# Patient Record
Sex: Female | Born: 1960
Health system: Southern US, Community
[De-identification: ages and names within clinical notes are randomized; demographics above are authoritative.]

## PROBLEM LIST (undated history)

## (undated) ENCOUNTER — Ambulatory Visit

## (undated) DIAGNOSIS — K219 Gastro-esophageal reflux disease without esophagitis: Secondary | ICD-10-CM

## (undated) DIAGNOSIS — I10 Essential (primary) hypertension: Secondary | ICD-10-CM

## (undated) DIAGNOSIS — R200 Anesthesia of skin: Secondary | ICD-10-CM

## (undated) DIAGNOSIS — T7840XA Allergy, unspecified, initial encounter: Secondary | ICD-10-CM

## (undated) DIAGNOSIS — D649 Anemia, unspecified: Secondary | ICD-10-CM

## (undated) DIAGNOSIS — E785 Hyperlipidemia, unspecified: Secondary | ICD-10-CM

## (undated) DIAGNOSIS — M940 Chondrocostal junction syndrome [Tietze]: Secondary | ICD-10-CM

## (undated) DIAGNOSIS — C801 Malignant (primary) neoplasm, unspecified: Secondary | ICD-10-CM

## (undated) HISTORY — PX: BREAST BIOPSY: SHX20

## (undated) HISTORY — DX: Anesthesia of skin: R20.0

## (undated) HISTORY — DX: Anemia, unspecified: D64.9

## (undated) HISTORY — PX: CHOLECYSTECTOMY: SHX55

## (undated) HISTORY — DX: Essential (primary) hypertension: I10

## (undated) HISTORY — DX: Allergy, unspecified, initial encounter: T78.40XA

## (undated) HISTORY — DX: Chondrocostal junction syndrome (tietze): M94.0

## (undated) HISTORY — DX: Gastro-esophageal reflux disease without esophagitis: K21.9

## (undated) HISTORY — DX: Malignant (primary) neoplasm, unspecified: C80.1

## (undated) HISTORY — PX: CYSTECTOMY: SUR359

## (undated) HISTORY — PX: BREAST EXCISIONAL BIOPSY: SUR124

## (undated) HISTORY — PX: ABDOMINAL HYSTERECTOMY: SHX81

## (undated) HISTORY — DX: Hyperlipidemia, unspecified: E78.5

---

## 1997-12-30 ENCOUNTER — Inpatient Hospital Stay (HOSPITAL_COMMUNITY): Admission: RE | Admit: 1997-12-30 | Discharge: 1998-01-01 | Payer: Self-pay | Admitting: Obstetrics and Gynecology

## 1998-01-31 ENCOUNTER — Ambulatory Visit: Admission: RE | Admit: 1998-01-31 | Discharge: 1998-01-31 | Payer: Self-pay | Admitting: Family Medicine

## 2002-04-08 ENCOUNTER — Other Ambulatory Visit: Admission: RE | Admit: 2002-04-08 | Discharge: 2002-04-08 | Payer: Self-pay | Admitting: Family Medicine

## 2006-10-30 ENCOUNTER — Encounter: Admission: RE | Admit: 2006-10-30 | Discharge: 2006-10-30 | Payer: Self-pay | Admitting: Family Medicine

## 2007-09-23 ENCOUNTER — Ambulatory Visit: Payer: Self-pay | Admitting: Cardiology

## 2007-09-23 LAB — CONVERTED CEMR LAB: Pro B Natriuretic peptide (BNP): 72 pg/mL (ref 0.0–100.0)

## 2008-04-27 ENCOUNTER — Encounter: Admission: RE | Admit: 2008-04-27 | Discharge: 2008-04-27 | Payer: Self-pay | Admitting: Family Medicine

## 2009-09-21 ENCOUNTER — Encounter: Admission: RE | Admit: 2009-09-21 | Discharge: 2009-09-21 | Payer: Self-pay | Admitting: Family Medicine

## 2010-04-20 ENCOUNTER — Encounter: Admission: RE | Admit: 2010-04-20 | Discharge: 2010-04-20 | Payer: Self-pay | Admitting: Emergency Medicine

## 2010-04-20 ENCOUNTER — Emergency Department (HOSPITAL_COMMUNITY): Admission: EM | Admit: 2010-04-20 | Discharge: 2010-04-20 | Payer: Self-pay | Admitting: Emergency Medicine

## 2010-05-07 ENCOUNTER — Encounter: Admission: RE | Admit: 2010-05-07 | Discharge: 2010-05-07 | Payer: Self-pay | Admitting: Emergency Medicine

## 2010-05-25 ENCOUNTER — Ambulatory Visit (HOSPITAL_COMMUNITY)
Admission: RE | Admit: 2010-05-25 | Discharge: 2010-05-25 | Payer: Self-pay | Source: Home / Self Care | Admitting: Gastroenterology

## 2010-08-17 ENCOUNTER — Other Ambulatory Visit: Payer: Self-pay | Admitting: Gastroenterology

## 2010-08-17 DIAGNOSIS — K862 Cyst of pancreas: Secondary | ICD-10-CM

## 2010-08-23 ENCOUNTER — Ambulatory Visit
Admission: RE | Admit: 2010-08-23 | Discharge: 2010-08-23 | Disposition: A | Discharge status: Home / Self Care | Payer: 59 | Source: Ambulatory Visit | Attending: Gastroenterology | Admitting: Gastroenterology

## 2010-08-23 DIAGNOSIS — K862 Cyst of pancreas: Secondary | ICD-10-CM

## 2010-08-23 MED ORDER — IOHEXOL 300 MG/ML  SOLN: 125.0000 mL | Freq: Once | INTRAMUSCULAR | Status: AC | PRN

## 2010-09-25 LAB — PANC CYST FLD ANLYS-PATHFNDR-TG

## 2010-09-27 LAB — URINALYSIS, ROUTINE W REFLEX MICROSCOPIC
Bilirubin Urine: NEGATIVE
Glucose, UA: NEGATIVE mg/dL
Hgb urine dipstick: NEGATIVE
Ketones, ur: NEGATIVE mg/dL
Nitrite: NEGATIVE
Protein, ur: NEGATIVE mg/dL
Specific Gravity, Urine: 1.018 (ref 1.005–1.030)
Urobilinogen, UA: 0.2 mg/dL (ref 0.0–1.0)
pH: 7.5 (ref 5.0–8.0)

## 2010-10-26 ENCOUNTER — Other Ambulatory Visit: Payer: Self-pay | Admitting: Family Medicine

## 2010-10-26 DIAGNOSIS — Z1231 Encounter for screening mammogram for malignant neoplasm of breast: Secondary | ICD-10-CM

## 2010-10-31 ENCOUNTER — Ambulatory Visit
Admission: RE | Admit: 2010-10-31 | Discharge: 2010-10-31 | Disposition: A | Discharge status: Home / Self Care | Payer: 59 | Source: Ambulatory Visit | Attending: Family Medicine | Admitting: Family Medicine

## 2010-10-31 DIAGNOSIS — Z1231 Encounter for screening mammogram for malignant neoplasm of breast: Secondary | ICD-10-CM

## 2010-11-09 ENCOUNTER — Encounter: Payer: 59 | Attending: General Surgery | Admitting: *Deleted

## 2010-11-09 DIAGNOSIS — E119 Type 2 diabetes mellitus without complications: Secondary | ICD-10-CM | POA: Insufficient documentation

## 2010-11-09 DIAGNOSIS — Z713 Dietary counseling and surveillance: Secondary | ICD-10-CM | POA: Insufficient documentation

## 2010-11-27 NOTE — Assessment & Plan Note (Signed)
[name]                            CARDIOLOGY OFFICE NOTE   [name] [name]                   MRN:          [id number]  DATE:09/23/2007                            DOB:          02-23-1961    REFERRING PHYSICIAN:  Elvina Parks, M.D.   PRIMARY CARE PHYSICIAN:  Dr. Leilani Parks.   REASON FOR CONSULTATION:  Evaluate patient with questionable paroxysmal  nocturnal dyspnea.   HISTORY OF PRESENT ILLNESS:  The patient is a lovely 50 year old African  American female with no prior cardiac history.  She has been reporting  shortness of breath.  This has been on and off for a year.  She  particularly notices it at night when she gets up to go to the bathroom.  She may come back and be so dyspneic, she has to sleep propped up.  This  does not happen all the time.  She goes to bed lying flat and cannot  sleep through the night without dyspnea.  She does not describe as much  shortness of breath during the day.  She actually did some aerobics for  30 minutes again the other day and started to get back into this.  She  says she was tired but she was breathing okay.  She does not describe  chest pressure, neck or arm discomfort.  She does not have palpitations,  presyncope or syncope.   PAST MEDICAL HISTORY:  1. Hypertension times 14 years.  2. Hyperlipidemia times one year.   PAST SURGICAL HISTORY:  1. C-section x2.  2. Hysterectomy.  3. Skin nodules removed.  4. Skin cancer resected from her eyelid 2004.   ALLERGIES:  None.   MEDICATIONS:  1. Simvastatin 20 mg daily.  2. Diovan 160/25 mg daily.  3. Orlistat.   SOCIAL HISTORY:  The patient is divorced.  She has three adult children,  one grandchild.  She works.  She only smoked briefly quitting 6 years  ago.  She drinks alcohol occasionally.   FAMILY HISTORY:  Noncontributory for coronary disease, though her father  died of heart failure at age 15 probably from EtOH abuse.  Her mom  did  have a slight CVA, but is well at 63.   REVIEW OF SYSTEMS:  Positive for loud snoring, some daytime somnolence,  reflux, progressive slow weight gain, joint pain and bilateral knees,  lower extremity edema.  Negative for other systems.   PHYSICAL EXAMINATION:  GENERAL:  The patient is in no distress.  VITAL SIGNS:  Blood pressure 141/92, heart rate 71 and regular, weight  307 pounds, body mass index 50.  HEENT:  Eyes unremarkable.  Pupils equal, round, reactive to light.  Fundi not well visualized, oral mucous unremarkable.  NECK:  No jugular distention at 45 degrees.  Carotid upstroke brisk and  symmetrical.  No bruits, thyromegaly.  LYMPHATICS:  No cervical, axillary, inguinal adenopathy.  LUNGS:  Clear to auscultation bilaterally.  BACK:  No costovertebral angle tenderness.  CHEST:  Unremarkable.  HEART:  PMI not displaced or sustained, S1-S2 within normal.  No S3-S4,  no clicks, rubs, murmurs.  ABDOMEN:  Morbidly obese, positive bowel sounds normal in frequency and  pitch, no bruits, rebound, guarding or midline pulsatile mass.  No  hepatomegaly or splenomegaly.  SKIN:  No rashes.  No nodules.  EXTREMITIES:  Pulses 2+ throughout, trace bilateral lower extremity  edema.  No cyanosis or clubbing.  NEUROLOGICAL:  Oriented to person, place, time.  Cranial nerves II-XII  grossly intact, motor grossly intact.   STUDIES:  EKG (done in Urgent Care) sinus rhythm, rate 67, axis within  normal limits, intervals within normal limits, no acute ST-T wave  changes.   ASSESSMENT/PLAN:  1. Dyspnea.  The patient has dyspnea and questionable PND at night.      At this point, I am going to start with a BNP level.  I am also      going to bring her back for exercise treadmill test to evaluate her      level of dyspnea.  I have considered an echocardiogram as well.      However, if she has a normal BNP, it is unlikely to find any      significant cardiovascular disease on echo.  Further  evaluation of      this complaint will be based on that these results.  2. Obesity.  This is certainly a significant problem.  I am very proud      of the patient for taking the steps that she has taken to try to      lose weight.  She sounds like she is on the right track.  I am      going to give her a prescription for exercise based on the      treadmill test and will discuss further weight loss.  3. Snoring.  The patient has snoring and almost definitely has sleep      apnea.  This may explain her difficult to control hypertension.      She is going to get a sleep study.  4. Hypertension.  She is having this followed closely by Dr. Pecola Parks and      will continue the medications as listed.  Certainly, weight loss      and any treatment of sleep apnea will improve      this control.  5. Followup.  Will see her at the time of her treadmill.     Diana Rotunda, MD, [facility]  Electronically Signed    [name]  DD: 09/23/2007  DT: 09/24/2007  Job #: [id number]   cc:   [name], M.D.  [name], M.D.

## 2010-12-27 ENCOUNTER — Other Ambulatory Visit (INDEPENDENT_AMBULATORY_CARE_PROVIDER_SITE_OTHER): Payer: Self-pay | Admitting: General Surgery

## 2010-12-27 ENCOUNTER — Encounter (HOSPITAL_COMMUNITY): Payer: 59

## 2010-12-27 ENCOUNTER — Ambulatory Visit (HOSPITAL_COMMUNITY)
Admission: RE | Admit: 2010-12-27 | Discharge: 2010-12-27 | Disposition: A | Discharge status: Home / Self Care | Payer: 59 | Source: Ambulatory Visit | Attending: General Surgery | Admitting: General Surgery

## 2010-12-27 DIAGNOSIS — Z01811 Encounter for preprocedural respiratory examination: Secondary | ICD-10-CM

## 2010-12-27 DIAGNOSIS — Z01810 Encounter for preprocedural cardiovascular examination: Secondary | ICD-10-CM | POA: Insufficient documentation

## 2010-12-27 DIAGNOSIS — Z01818 Encounter for other preprocedural examination: Secondary | ICD-10-CM | POA: Insufficient documentation

## 2010-12-27 DIAGNOSIS — Z01812 Encounter for preprocedural laboratory examination: Secondary | ICD-10-CM | POA: Insufficient documentation

## 2010-12-27 LAB — DIFFERENTIAL
Basophils Absolute: 0 10*3/uL (ref 0.0–0.1)
Basophils Relative: 0 % (ref 0–1)
Eosinophils Absolute: 0.2 10*3/uL (ref 0.0–0.7)
Eosinophils Relative: 2 % (ref 0–5)
Lymphocytes Relative: 41 % (ref 12–46)
Lymphs Abs: 3.3 10*3/uL (ref 0.7–4.0)
Monocytes Absolute: 0.5 10*3/uL (ref 0.1–1.0)
Monocytes Relative: 6 % (ref 3–12)
Neutro Abs: 4.3 10*3/uL (ref 1.7–7.7)
Neutrophils Relative %: 52 % (ref 43–77)

## 2010-12-27 LAB — CBC
HCT: 40.7 % (ref 36.0–46.0)
Hemoglobin: 12.6 g/dL (ref 12.0–15.0)
MCH: 24.5 pg — ABNORMAL LOW (ref 26.0–34.0)
MCHC: 31 g/dL (ref 30.0–36.0)
MCV: 79.2 fL (ref 78.0–100.0)
Platelets: 295 10*3/uL (ref 150–400)
RBC: 5.14 MIL/uL — ABNORMAL HIGH (ref 3.87–5.11)
RDW: 13.7 % (ref 11.5–15.5)
WBC: 8.3 10*3/uL (ref 4.0–10.5)

## 2010-12-27 LAB — PROTIME-INR
INR: 1.02 (ref 0.00–1.49)
Prothrombin Time: 13.6 seconds (ref 11.6–15.2)

## 2010-12-27 LAB — COMPREHENSIVE METABOLIC PANEL
ALT: 31 U/L (ref 0–35)
AST: 26 U/L (ref 0–37)
Albumin: 4.5 g/dL (ref 3.5–5.2)
Alkaline Phosphatase: 63 U/L (ref 39–117)
BUN: 13 mg/dL (ref 6–23)
CO2: 30 mEq/L (ref 19–32)
Calcium: 10 mg/dL (ref 8.4–10.5)
Chloride: 98 mEq/L (ref 96–112)
Creatinine, Ser: 0.7 mg/dL (ref 0.4–1.2)
GFR calc Af Amer: >60 mL/min (ref >60)
GFR calc non Af Amer: >60 mL/min (ref >60)
Glucose, Bld: 118 mg/dL — ABNORMAL HIGH (ref 70–99)
Potassium: 3.6 mEq/L (ref 3.5–5.1)
Sodium: 137 mEq/L (ref 135–145)
Total Bilirubin: 0.3 mg/dL (ref 0.3–1.2)
Total Protein: 7.9 g/dL (ref 6.0–8.3)

## 2010-12-27 LAB — CEA: CEA: 0.6 ng/mL (ref 0.0–5.0)

## 2010-12-27 LAB — SURGICAL PCR SCREEN
MRSA, PCR: NEGATIVE
Staphylococcus aureus: NEGATIVE

## 2010-12-27 LAB — CANCER ANTIGEN 19-9: CA 19-9: 10.5 U/mL — ABNORMAL LOW (ref <35.0)

## 2010-12-27 LAB — APTT: aPTT: 36 seconds (ref 24–37)

## 2011-01-09 ENCOUNTER — Inpatient Hospital Stay (HOSPITAL_COMMUNITY)
Admission: RE | Admit: 2011-01-09 | Discharge: 2011-01-14 | DRG: 406 | Disposition: A | Discharge status: Home / Self Care | Payer: 59 | Source: Ambulatory Visit | Attending: General Surgery | Admitting: General Surgery

## 2011-01-09 ENCOUNTER — Other Ambulatory Visit (INDEPENDENT_AMBULATORY_CARE_PROVIDER_SITE_OTHER): Payer: Self-pay | Admitting: General Surgery

## 2011-01-09 DIAGNOSIS — K863 Pseudocyst of pancreas: Secondary | ICD-10-CM

## 2011-01-09 DIAGNOSIS — E119 Type 2 diabetes mellitus without complications: Secondary | ICD-10-CM | POA: Diagnosis present

## 2011-01-09 DIAGNOSIS — J9819 Other pulmonary collapse: Secondary | ICD-10-CM | POA: Diagnosis not present

## 2011-01-09 DIAGNOSIS — K8010 Calculus of gallbladder with chronic cholecystitis without obstruction: Secondary | ICD-10-CM

## 2011-01-09 DIAGNOSIS — Z01812 Encounter for preprocedural laboratory examination: Secondary | ICD-10-CM

## 2011-01-09 DIAGNOSIS — I10 Essential (primary) hypertension: Secondary | ICD-10-CM | POA: Diagnosis present

## 2011-01-09 DIAGNOSIS — D136 Benign neoplasm of pancreas: Principal | ICD-10-CM | POA: Diagnosis present

## 2011-01-09 DIAGNOSIS — K811 Chronic cholecystitis: Secondary | ICD-10-CM | POA: Diagnosis present

## 2011-01-09 DIAGNOSIS — K862 Cyst of pancreas: Secondary | ICD-10-CM

## 2011-01-09 HISTORY — PX: SPLENECTOMY, PARTIAL: SHX787

## 2011-01-09 HISTORY — PX: PANCREATECTOMY: SHX1019

## 2011-01-09 LAB — GLUCOSE, CAPILLARY
Glucose-Capillary: 151 mg/dL — ABNORMAL HIGH (ref 70–99)
Glucose-Capillary: 217 mg/dL — ABNORMAL HIGH (ref 70–99)
Glucose-Capillary: 218 mg/dL — ABNORMAL HIGH (ref 70–99)
Glucose-Capillary: 232 mg/dL — ABNORMAL HIGH (ref 70–99)

## 2011-01-09 LAB — TYPE AND SCREEN
ABO/RH(D): O POS
Antibody Screen: NEGATIVE

## 2011-01-09 LAB — ABO/RH: ABO/RH(D): O POS

## 2011-01-10 DIAGNOSIS — I10 Essential (primary) hypertension: Secondary | ICD-10-CM

## 2011-01-10 DIAGNOSIS — E119 Type 2 diabetes mellitus without complications: Secondary | ICD-10-CM

## 2011-01-10 LAB — GLUCOSE, CAPILLARY
Glucose-Capillary: 160 mg/dL — ABNORMAL HIGH (ref 70–99)
Glucose-Capillary: 172 mg/dL — ABNORMAL HIGH (ref 70–99)
Glucose-Capillary: 195 mg/dL — ABNORMAL HIGH (ref 70–99)
Glucose-Capillary: 211 mg/dL — ABNORMAL HIGH (ref 70–99)
Glucose-Capillary: 227 mg/dL — ABNORMAL HIGH (ref 70–99)

## 2011-01-10 LAB — HEMOGLOBIN A1C
Hgb A1c MFr Bld: 7.7 % — ABNORMAL HIGH (ref <5.7)
Mean Plasma Glucose: 174 mg/dL — ABNORMAL HIGH (ref <117)

## 2011-01-10 LAB — CBC
HCT: 35.9 % — ABNORMAL LOW (ref 36.0–46.0)
Hemoglobin: 11.3 g/dL — ABNORMAL LOW (ref 12.0–15.0)
MCH: 24.8 pg — ABNORMAL LOW (ref 26.0–34.0)
MCHC: 31.5 g/dL (ref 30.0–36.0)
MCV: 78.9 fL (ref 78.0–100.0)
Platelets: 274 10*3/uL (ref 150–400)
RBC: 4.55 MIL/uL (ref 3.87–5.11)
RDW: 14.1 % (ref 11.5–15.5)
WBC: 19 10*3/uL — ABNORMAL HIGH (ref 4.0–10.5)

## 2011-01-10 LAB — BASIC METABOLIC PANEL
BUN: 8 mg/dL (ref 6–23)
CO2: 27 mEq/L (ref 19–32)
Calcium: 8.7 mg/dL (ref 8.4–10.5)
Chloride: 95 mEq/L — ABNORMAL LOW (ref 96–112)
Creatinine, Ser: 0.61 mg/dL (ref 0.50–1.10)
GFR calc Af Amer: >60 mL/min (ref >60)
GFR calc non Af Amer: >60 mL/min (ref >60)
Glucose, Bld: 213 mg/dL — ABNORMAL HIGH (ref 70–99)
Potassium: 4.2 mEq/L (ref 3.5–5.1)
Sodium: 130 mEq/L — ABNORMAL LOW (ref 135–145)

## 2011-01-10 LAB — MAGNESIUM: Magnesium: 2 mg/dL (ref 1.5–2.5)

## 2011-01-10 LAB — PHOSPHORUS: Phosphorus: 3.1 mg/dL (ref 2.3–4.6)

## 2011-01-11 LAB — BASIC METABOLIC PANEL
BUN: 7 mg/dL (ref 6–23)
CO2: 30 mEq/L (ref 19–32)
Calcium: 9.1 mg/dL (ref 8.4–10.5)
Chloride: 96 mEq/L (ref 96–112)
Creatinine, Ser: 0.69 mg/dL (ref 0.50–1.10)
GFR calc Af Amer: >60 mL/min (ref >60)
GFR calc non Af Amer: >60 mL/min (ref >60)
Glucose, Bld: 208 mg/dL — ABNORMAL HIGH (ref 70–99)
Potassium: 3.7 mEq/L (ref 3.5–5.1)
Sodium: 131 mEq/L — ABNORMAL LOW (ref 135–145)

## 2011-01-11 LAB — MAGNESIUM: Magnesium: 2.2 mg/dL (ref 1.5–2.5)

## 2011-01-11 LAB — GLUCOSE, CAPILLARY
Glucose-Capillary: 181 mg/dL — ABNORMAL HIGH (ref 70–99)
Glucose-Capillary: 184 mg/dL — ABNORMAL HIGH (ref 70–99)
Glucose-Capillary: 221 mg/dL — ABNORMAL HIGH (ref 70–99)
Glucose-Capillary: 216 mg/dL — ABNORMAL HIGH (ref 70–99)

## 2011-01-11 LAB — CBC
HCT: 33.5 % — ABNORMAL LOW (ref 36.0–46.0)
Hemoglobin: 10.7 g/dL — ABNORMAL LOW (ref 12.0–15.0)
MCH: 25.2 pg — ABNORMAL LOW (ref 26.0–34.0)
MCHC: 31.9 g/dL (ref 30.0–36.0)
MCV: 78.8 fL (ref 78.0–100.0)
Platelets: 259 10*3/uL (ref 150–400)
RBC: 4.25 MIL/uL (ref 3.87–5.11)
RDW: 14.2 % (ref 11.5–15.5)
WBC: 20.7 10*3/uL — ABNORMAL HIGH (ref 4.0–10.5)

## 2011-01-11 LAB — PHOSPHORUS: Phosphorus: 1.8 mg/dL — ABNORMAL LOW (ref 2.3–4.6)

## 2011-01-12 LAB — CBC
HCT: 34.8 % — ABNORMAL LOW (ref 36.0–46.0)
Hemoglobin: 10.9 g/dL — ABNORMAL LOW (ref 12.0–15.0)
MCH: 24.5 pg — ABNORMAL LOW (ref 26.0–34.0)
MCHC: 31.3 g/dL (ref 30.0–36.0)
MCV: 78.4 fL (ref 78.0–100.0)
Platelets: 288 10*3/uL (ref 150–400)
RBC: 4.44 MIL/uL (ref 3.87–5.11)
RDW: 14.2 % (ref 11.5–15.5)
WBC: 20.4 10*3/uL — ABNORMAL HIGH (ref 4.0–10.5)

## 2011-01-12 LAB — BASIC METABOLIC PANEL
BUN: 5 mg/dL — ABNORMAL LOW (ref 6–23)
CO2: 31 mEq/L (ref 19–32)
Calcium: 8.7 mg/dL (ref 8.4–10.5)
Chloride: 100 mEq/L (ref 96–112)
Creatinine, Ser: 0.65 mg/dL (ref 0.50–1.10)
GFR calc Af Amer: >60 mL/min (ref >60)
GFR calc non Af Amer: >60 mL/min (ref >60)
Glucose, Bld: 175 mg/dL — ABNORMAL HIGH (ref 70–99)
Potassium: 3.7 mEq/L (ref 3.5–5.1)
Sodium: 138 mEq/L (ref 135–145)

## 2011-01-12 LAB — GLUCOSE, CAPILLARY
Glucose-Capillary: 197 mg/dL — ABNORMAL HIGH (ref 70–99)
Glucose-Capillary: 204 mg/dL — ABNORMAL HIGH (ref 70–99)
Glucose-Capillary: 191 mg/dL — ABNORMAL HIGH (ref 70–99)
Glucose-Capillary: 182 mg/dL — ABNORMAL HIGH (ref 70–99)
Glucose-Capillary: 182 mg/dL — ABNORMAL HIGH (ref 70–99)

## 2011-01-12 LAB — MAGNESIUM: Magnesium: 2.6 mg/dL — ABNORMAL HIGH (ref 1.5–2.5)

## 2011-01-12 LAB — PHOSPHORUS: Phosphorus: 1.9 mg/dL — ABNORMAL LOW (ref 2.3–4.6)

## 2011-01-13 LAB — CBC
HCT: 32.5 % — ABNORMAL LOW (ref 36.0–46.0)
Hemoglobin: 10.6 g/dL — ABNORMAL LOW (ref 12.0–15.0)
MCH: 25.1 pg — ABNORMAL LOW (ref 26.0–34.0)
MCHC: 32.6 g/dL (ref 30.0–36.0)
MCV: 77 fL — ABNORMAL LOW (ref 78.0–100.0)
Platelets: 332 10*3/uL (ref 150–400)
RBC: 4.22 MIL/uL (ref 3.87–5.11)
RDW: 14.4 % (ref 11.5–15.5)
WBC: 18.1 10*3/uL — ABNORMAL HIGH (ref 4.0–10.5)

## 2011-01-13 LAB — GLUCOSE, CAPILLARY
Glucose-Capillary: 200 mg/dL — ABNORMAL HIGH (ref 70–99)
Glucose-Capillary: 170 mg/dL — ABNORMAL HIGH (ref 70–99)

## 2011-01-13 NOTE — Op Note (Signed)
NAMEMICHAEL, Parks                 ACCOUNT NO.:  [id number]  MEDICAL RECORD NO.:  [id number]  LOCATION:  0007                         FACILITY:  [facility]  PHYSICIAN:  Almond Lint, MD       DATE OF BIRTH:  28-Jan-1961  DATE OF PROCEDURE:  01/09/2011 DATE OF DISCHARGE:                              OPERATIVE REPORT   PREOPERATIVE DIAGNOSIS:  Persistent pseudocyst with left upper quadrant pain as well as right upper quadrant pain with fatty foods and gallstones.  POSTOPERATIVE DIAGNOSIS:  Persistent pseudocyst with left upper quadrant pain as well as right upper quadrant pain with fatty foods and gallstones.  PROCEDURE:  Laparoscopic distal pancreatectomy and splenectomy and laparoscopic cholecystectomy.  SURGEON:  Almond Lint, MD  ASSISTANT:  Chevis Pretty, MD  ANESTHESIA:  General and local.  FINDINGS:  Pseudocyst intimately associated with splenic hilum.  SPECIMEN:  Distal pancreas and spleen and gallbladder to pathology.  ESTIMATED BLOOD LOSS:  Around 50 to 100 mL.  COMPLICATIONS:  None known.  PROCEDURE:  Ms. [name] was identified in the holding area and taken to operating room where she was placed supine on the operating room table. General anesthesia was induced.  She was then placed into the right lateral decubitus position on a beanbag with her midline marked.  The abdomen was prepped and draped in sterile fashion.  Time-out was performed according to surgical safety check list.  When all was correct, we continued.  The abdomen was accessed with a 5-mm Optiview trocar.  This had to be switched from 75 to 100 to be able to access the abdomen.  Once the layers of the abdominal wall were visualized, pneumoperitoneum was to a pressure of 15 mmHg. The patient was placed in reverse Trendelenburg position.  Three additional 5-mm trocars were placed in the left subcostal region.  There were some omental adhesions of the abdominal wall that were taken down. The lesser sac was  opened by dividing the short gastrics with harmonic scalpel.  The attachments of the spleen to the diaphragm were also taken.  The plane was then taken off laterally.  The peritoneum was opened at the inferior border of the pancreas and this was taken laterally with the harmonic as well.  At this point, a hand port was placed in the left subcostal region.  This was done by using a #10 blade to incise the skin and then using cautery to get through the subcutaneous tissues and the muscle.  The harmonic scalpel was used to divide the superior epigastric artery and vein.  The GelPort was placed in.  The hand retraction was then used to facilitate dissecting off the splenic vessels from the pancreas.  The splenic vessels were then divided with the Echelon vascular load battery-operated stapler.  The pancreas was then divided with gold blue Echelon stapler.  Additional vascular load was used to divide some of the retroperitoneal attachments and then the harmonic was used to take the specimen off of the retroperitoneum rest of the way.  This was then pulled out through the GelPort.  There was no significant bleeding seen and hemostasis was achieved with the cautery.  A drain was placed  through the most lateral trocar site and sewn in place with 2-0 nylon.  Tisseel was placed over the pancreatic tail.  There was no evidence of leak or bleeding at the pancreatic tail.  One of the trocars had been upsized to a 12 to accommodate the stapler.  This was closed with Endoclose and 0 Vicryl. This was airtight and no residual palpable fascial defect was present. The GelPort site and the additional 5-mm trocar site were stapled closed and the patient was placed back supine.  The abdomen was re-prepped and draped and another timeout was performed.  The GelPort was replaced and the 5 mm trocar was replaced at the additional site.  Two additional 5 mm trocars were placed in the right upper quadrant.  The  patient was placed into reverse Trendelenburg position and rotated to the left.  The gallbladder was extremely distended.  This was elevated toward the head and then the infundibulum retracted laterally.  The hook cautery was used to open the peritoneum and then the cystic artery was very superficial.  This was skeletonized, clipped, and divided.  The cystic duct was quite tapered and this was also skeletonized away from the gallbladder.  The hook cautery was used to open up the peritoneum to allow a critical view, demonstrating the cystic duct going directly into the gallbladder.  This was quadruply clipped on the patient's side and singly clipped on the specimen side.  This was then divided.  The cautery was used to take gallbladder off the gallbladder fossa.  This was then placed into an EndoCatch bag and held in place at [employer]. There was no residual bleeding or biliary leakage from the liver bed. The gallbladder was then removed.  The full quadrant inspection was performed and there was no evidence of blood pooling in the abdomen or other significant gross pathology.  There were a few small adhesions in the abdominal wall that were taken down.  The 5-mm trocars were then removed and the GelPort was removed.  The GelPort incision site was irrigated copiously.  This was then closed and 2 fascial layers with 0 PDS suture in a running fashion.  In between the layers, the On-Q pain pump catheter was placed.  The skin was then irrigated again and closed with skin staples.  The wounds were dressed with dry sterile dressings and the patient was awaken from anesthesia and taken to PACU in stable condition.  Needle, sponge, and instrument counts were correct x2.     Almond Lint, MD     FB/MEDQ  D:  01/09/2011  T:  01/09/2011  Job:  [id number]  Electronically Signed by Almond Lint MD on 01/13/2011 12:50:48 PM

## 2011-01-14 LAB — DIFFERENTIAL
Basophils Absolute: 0 10*3/uL (ref 0.0–0.1)
Basophils Relative: 0 % (ref 0–1)
Eosinophils Absolute: 0.2 10*3/uL (ref 0.0–0.7)
Eosinophils Relative: 1 % (ref 0–5)
Lymphocytes Relative: 17 % (ref 12–46)
Lymphs Abs: 3.6 10*3/uL (ref 0.7–4.0)
Monocytes Absolute: 1.4 10*3/uL — ABNORMAL HIGH (ref 0.1–1.0)
Monocytes Relative: 7 % (ref 3–12)
Neutro Abs: 15.5 10*3/uL — ABNORMAL HIGH (ref 1.7–7.7)
Neutrophils Relative %: 75 % (ref 43–77)

## 2011-01-14 LAB — CBC
HCT: 31.6 % — ABNORMAL LOW (ref 36.0–46.0)
Hemoglobin: 10.3 g/dL — ABNORMAL LOW (ref 12.0–15.0)
MCH: 24.7 pg — ABNORMAL LOW (ref 26.0–34.0)
MCHC: 32.6 g/dL (ref 30.0–36.0)
MCV: 75.8 fL — ABNORMAL LOW (ref 78.0–100.0)
Platelets: 364 10*3/uL (ref 150–400)
RBC: 4.17 MIL/uL (ref 3.87–5.11)
RDW: 14 % (ref 11.5–15.5)
WBC: 20.8 10*3/uL — ABNORMAL HIGH (ref 4.0–10.5)

## 2011-01-14 LAB — GLUCOSE, CAPILLARY
Glucose-Capillary: 173 mg/dL — ABNORMAL HIGH (ref 70–99)
Glucose-Capillary: 149 mg/dL — ABNORMAL HIGH (ref 70–99)

## 2011-01-18 ENCOUNTER — Encounter (INDEPENDENT_AMBULATORY_CARE_PROVIDER_SITE_OTHER): Payer: 59 | Admitting: Surgery

## 2011-01-18 ENCOUNTER — Encounter (INDEPENDENT_AMBULATORY_CARE_PROVIDER_SITE_OTHER): Payer: Self-pay | Admitting: Surgery

## 2011-01-18 ENCOUNTER — Other Ambulatory Visit (INDEPENDENT_AMBULATORY_CARE_PROVIDER_SITE_OTHER): Payer: Self-pay | Admitting: General Surgery

## 2011-01-18 DIAGNOSIS — D136 Benign neoplasm of pancreas: Secondary | ICD-10-CM

## 2011-01-18 LAB — CULTURE, BLOOD (ROUTINE X 2)
Culture  Setup Time: 201206300425
Culture  Setup Time: 201206300425
Culture: NO GROWTH
Culture: NO GROWTH

## 2011-01-21 ENCOUNTER — Ambulatory Visit: Payer: 59 | Admitting: *Deleted

## 2011-01-22 NOTE — Discharge Summary (Signed)
  [name], [name]                 ACCOUNT NO.:  [id number]  MEDICAL RECORD NO.:  [id number]  LOCATION:  1535                         FACILITY:  [facility]  PHYSICIAN:  Almond Lint, MD       DATE OF BIRTH:  Nov 16, 1960  DATE OF ADMISSION:  01/09/2011 DATE OF DISCHARGE:  01/14/2011                              DISCHARGE SUMMARY   PRINCIPAL DIAGNOSIS:  Serous cystadenoma of the pancreas and chronic cholecystitis.  PRINCIPAL PROCEDURE:  Laparoscopic cholecystectomy and laparoscopic distal pancreatectomy and splenectomy.  DISCHARGE MEDICATIONS: 1. Percocet 1 to 2 tablets p.o. q.4 h. p.r.n. pain. 2. Lisinopril/hydrochlorothiazide 20/12.5 once a day. 3. Metformin 500 mg q.h.s. 4. Pravastatin 40 mg q.a.m. 5. Ibuprofen 600 mg q.8 h. as needed. 6. Vitamin D 50,000 units once a week.  DISCHARGE INSTRUCTIONS:  Emptying her cord, JP drain twice a day.  Call for fevers over 101.5, increased pain or increased nausea or bilious drainage from the Jackson-Pratt drain.  No lifting over 20 pounds for 6 weeks.  No driving for 2 weeks.  Follow up with me in clinic on January 18, 2011, at 11 a.m.  DISCHARGE DIET:  Carbohydrate-controlled, may shower.  HOSPITAL COURSE:  [name] is admitted to the step-down unit following her above procedure.  She did well on postop day #1 and was transferred to the floor.  Her NG tube was removed and her Foley catheter was removed on postop day #2 without incident.  She required some repletion of hypophosphatemia.  Her blood pressure improved.  Over the weekend, she did have some temperatures to 101.4, but did seemed to not be taking in deep breath.  Her abdomen was doing well and she did not have any nausea.  Her labs were good other than her white count of 20.  She was kept on Lovenox for help.  Her JP output was serosanguineous, but she did have 1 day of high output.  On June 30th, this was 70 mL.  She is going to be discharged to home in improved condition.  She  feels well and tolerating a regular carbohydrate-controlled diet.  She is personally instructed by myself per the call parameters.  She is at risk for having a leak from the pancreas, however, with how good she is feeling in her low-grade temperatures, I would not get a CAT scan just based on the white count.  I am going to follow her up in 4 days and remove her staples at this time.     Almond Lint, MD     FB/MEDQ  D:  01/14/2011  T:  01/14/2011  Job:  [id number]  Electronically Signed by Almond Lint MD on 01/22/2011 01:52:00 PM

## 2011-01-23 ENCOUNTER — Encounter (INDEPENDENT_AMBULATORY_CARE_PROVIDER_SITE_OTHER): Payer: Self-pay | Admitting: General Surgery

## 2011-01-24 ENCOUNTER — Ambulatory Visit (INDEPENDENT_AMBULATORY_CARE_PROVIDER_SITE_OTHER): Payer: 59 | Admitting: General Surgery

## 2011-01-24 ENCOUNTER — Encounter (INDEPENDENT_AMBULATORY_CARE_PROVIDER_SITE_OTHER): Payer: Self-pay | Admitting: General Surgery

## 2011-01-24 DIAGNOSIS — K811 Chronic cholecystitis: Secondary | ICD-10-CM

## 2011-01-24 DIAGNOSIS — D279 Benign neoplasm of unspecified ovary: Secondary | ICD-10-CM | POA: Insufficient documentation

## 2011-01-24 DIAGNOSIS — Z9049 Acquired absence of other specified parts of digestive tract: Secondary | ICD-10-CM | POA: Insufficient documentation

## 2011-01-24 MED ORDER — CIPROFLOXACIN HCL 750 MG PO TABS: 750.0000 mg | ORAL_TABLET | Freq: Two times a day (BID) | ORAL | Status: AC

## 2011-01-24 MED ORDER — OXYCODONE-ACETAMINOPHEN 5-325 MG PO TABS: 1.0000 | ORAL_TABLET | ORAL | Status: DC | PRN

## 2011-01-24 NOTE — Progress Notes (Signed)
Subjective:     Patient ID: [name], female   DOB: 1960-12-01, 51 y.o.   MRN: [id number]    Temp(Src) 96.9 F (36.1 C) (Temporal)    HPI [name] is doing okay status posterior laparoscopic cholecystectomy and lap distal pancreatectomy and splenectomy. She is having some low-grade fevers. She does have murky drain output. Her appetite is continuing to improve along with her energy level. She is having no drainage from her wound. She does complain of some numbness of her left fifth finger and her left foot.  Review of Systems     Objective:   Physical Exam Alert and oriented. Well groomed Abd soft, non distended, non tender   Drain output murky. Assessment:     Status post laparoscopic cholecystectomy and laparoscopic distal pancreatectomy and splenectomy. Doing reasonably well despite having murky drain output.    Plan:     She has a CT scan scheduled tomorrow to make sure she doesn't have any additional fluid collection needs to be drained. I will also place her on ciprofloxacin. I will follow her  up in 2 weeks.

## 2011-01-24 NOTE — Assessment & Plan Note (Addendum)
Murky drain output. Getting CT scan tomorrow Follow up in 2 weeks?

## 2011-01-25 ENCOUNTER — Ambulatory Visit
Admission: RE | Admit: 2011-01-25 | Discharge: 2011-01-25 | Disposition: A | Discharge status: Home / Self Care | Payer: 59 | Source: Ambulatory Visit | Attending: General Surgery | Admitting: General Surgery

## 2011-01-25 DIAGNOSIS — D136 Benign neoplasm of pancreas: Secondary | ICD-10-CM

## 2011-01-25 MED ORDER — IOHEXOL 300 MG/ML  SOLN
125.0000 mL | Freq: Once | INTRAMUSCULAR | Status: AC | PRN
  Administered 2011-01-25: 125 mL via INTRAVENOUS

## 2011-02-01 ENCOUNTER — Encounter (HOSPITAL_COMMUNITY): Payer: Self-pay | Admitting: Radiology

## 2011-02-01 ENCOUNTER — Inpatient Hospital Stay (HOSPITAL_COMMUNITY)
Admission: EM | Admit: 2011-02-01 | Discharge: 2011-02-13 | DRG: 862 | Disposition: A | Discharge status: Home / Self Care | Payer: 59 | Attending: General Surgery | Admitting: General Surgery

## 2011-02-01 ENCOUNTER — Emergency Department (HOSPITAL_COMMUNITY): Payer: 59

## 2011-02-01 DIAGNOSIS — Z9089 Acquired absence of other organs: Secondary | ICD-10-CM

## 2011-02-01 DIAGNOSIS — E669 Obesity, unspecified: Secondary | ICD-10-CM | POA: Diagnosis present

## 2011-02-01 DIAGNOSIS — E785 Hyperlipidemia, unspecified: Secondary | ICD-10-CM | POA: Diagnosis present

## 2011-02-01 DIAGNOSIS — Y836 Removal of other organ (partial) (total) as the cause of abnormal reaction of the patient, or of later complication, without mention of misadventure at the time of the procedure: Secondary | ICD-10-CM | POA: Diagnosis present

## 2011-02-01 DIAGNOSIS — K651 Peritoneal abscess: Secondary | ICD-10-CM | POA: Diagnosis present

## 2011-02-01 DIAGNOSIS — L02219 Cutaneous abscess of trunk, unspecified: Secondary | ICD-10-CM | POA: Diagnosis present

## 2011-02-01 DIAGNOSIS — E119 Type 2 diabetes mellitus without complications: Secondary | ICD-10-CM | POA: Diagnosis present

## 2011-02-01 DIAGNOSIS — D136 Benign neoplasm of pancreas: Secondary | ICD-10-CM | POA: Diagnosis present

## 2011-02-01 DIAGNOSIS — K811 Chronic cholecystitis: Secondary | ICD-10-CM | POA: Diagnosis present

## 2011-02-01 DIAGNOSIS — T8140XA Infection following a procedure, unspecified, initial encounter: Principal | ICD-10-CM | POA: Diagnosis present

## 2011-02-01 DIAGNOSIS — Z79899 Other long term (current) drug therapy: Secondary | ICD-10-CM

## 2011-02-01 LAB — URINALYSIS, ROUTINE W REFLEX MICROSCOPIC
Bilirubin Urine: NEGATIVE
Glucose, UA: NEGATIVE mg/dL
Hgb urine dipstick: NEGATIVE
Ketones, ur: NEGATIVE mg/dL
Leukocytes, UA: NEGATIVE
Nitrite: NEGATIVE
Protein, ur: NEGATIVE mg/dL
Specific Gravity, Urine: 1.018 (ref 1.005–1.030)
Urobilinogen, UA: 0.2 mg/dL (ref 0.0–1.0)
pH: 5.5 (ref 5.0–8.0)

## 2011-02-01 LAB — COMPREHENSIVE METABOLIC PANEL
ALT: 26 U/L (ref 0–35)
AST: 18 U/L (ref 0–37)
Albumin: 3.9 g/dL (ref 3.5–5.2)
Alkaline Phosphatase: 80 U/L (ref 39–117)
BUN: 10 mg/dL (ref 6–23)
CO2: 29 mEq/L (ref 19–32)
Calcium: 10 mg/dL (ref 8.4–10.5)
Chloride: 96 mEq/L (ref 96–112)
Creatinine, Ser: 0.63 mg/dL (ref 0.50–1.10)
GFR calc Af Amer: >60 mL/min (ref >60)
GFR calc non Af Amer: >60 mL/min (ref >60)
Glucose, Bld: 179 mg/dL — ABNORMAL HIGH (ref 70–99)
Potassium: 4.5 mEq/L (ref 3.5–5.1)
Sodium: 137 mEq/L (ref 135–145)
Total Bilirubin: 0.4 mg/dL (ref 0.3–1.2)
Total Protein: 8.8 g/dL — ABNORMAL HIGH (ref 6.0–8.3)

## 2011-02-01 LAB — CBC
HCT: 34.8 % — ABNORMAL LOW (ref 36.0–46.0)
Hemoglobin: 10.9 g/dL — ABNORMAL LOW (ref 12.0–15.0)
MCH: 24.1 pg — ABNORMAL LOW (ref 26.0–34.0)
MCHC: 31.3 g/dL (ref 30.0–36.0)
MCV: 77 fL — ABNORMAL LOW (ref 78.0–100.0)
Platelets: 648 10*3/uL — ABNORMAL HIGH (ref 150–400)
RBC: 4.52 MIL/uL (ref 3.87–5.11)
RDW: 14.2 % (ref 11.5–15.5)
WBC: 13.5 10*3/uL — ABNORMAL HIGH (ref 4.0–10.5)

## 2011-02-01 LAB — DIFFERENTIAL
Basophils Absolute: 0 10*3/uL (ref 0.0–0.1)
Basophils Relative: 0 % (ref 0–1)
Eosinophils Absolute: 0.4 10*3/uL (ref 0.0–0.7)
Eosinophils Relative: 3 % (ref 0–5)
Lymphocytes Relative: 30 % (ref 12–46)
Lymphs Abs: 4.1 10*3/uL — ABNORMAL HIGH (ref 0.7–4.0)
Monocytes Absolute: 1.3 10*3/uL — ABNORMAL HIGH (ref 0.1–1.0)
Monocytes Relative: 10 % (ref 3–12)
Neutro Abs: 7.7 10*3/uL (ref 1.7–7.7)
Neutrophils Relative %: 57 % (ref 43–77)

## 2011-02-01 LAB — LIPASE, BLOOD: Lipase: 28 U/L (ref 11–59)

## 2011-02-01 MED ORDER — IOHEXOL 300 MG/ML  SOLN
100.0000 mL | Freq: Once | INTRAMUSCULAR | Status: AC | PRN
  Administered 2011-02-01: 100 mL via INTRAVENOUS

## 2011-02-02 ENCOUNTER — Observation Stay (HOSPITAL_COMMUNITY): Payer: 59

## 2011-02-02 LAB — GLUCOSE, CAPILLARY
Glucose-Capillary: 151 mg/dL — ABNORMAL HIGH (ref 70–99)
Glucose-Capillary: 221 mg/dL — ABNORMAL HIGH (ref 70–99)
Glucose-Capillary: 193 mg/dL — ABNORMAL HIGH (ref 70–99)

## 2011-02-02 LAB — MRSA PCR SCREENING: MRSA by PCR: NEGATIVE

## 2011-02-02 LAB — AMYLASE, BODY FLUID: Amylase, Fluid: 1066 U/L

## 2011-02-03 LAB — GLUCOSE, CAPILLARY
Glucose-Capillary: 183 mg/dL — ABNORMAL HIGH (ref 70–99)
Glucose-Capillary: 181 mg/dL — ABNORMAL HIGH (ref 70–99)
Glucose-Capillary: 174 mg/dL — ABNORMAL HIGH (ref 70–99)

## 2011-02-03 LAB — COMPREHENSIVE METABOLIC PANEL
ALT: 26 U/L (ref 0–35)
AST: 23 U/L (ref 0–37)
Albumin: 3.1 g/dL — ABNORMAL LOW (ref 3.5–5.2)
Alkaline Phosphatase: 72 U/L (ref 39–117)
BUN: 5 mg/dL — ABNORMAL LOW (ref 6–23)
CO2: 28 mEq/L (ref 19–32)
Calcium: 9.5 mg/dL (ref 8.4–10.5)
Chloride: 96 mEq/L (ref 96–112)
Creatinine, Ser: 0.59 mg/dL (ref 0.50–1.10)
GFR calc Af Amer: >60 mL/min (ref >60)
GFR calc non Af Amer: >60 mL/min (ref >60)
Glucose, Bld: 195 mg/dL — ABNORMAL HIGH (ref 70–99)
Potassium: 3.7 mEq/L (ref 3.5–5.1)
Sodium: 134 mEq/L — ABNORMAL LOW (ref 135–145)
Total Bilirubin: 0.3 mg/dL (ref 0.3–1.2)
Total Protein: 7.8 g/dL (ref 6.0–8.3)

## 2011-02-03 LAB — DIFFERENTIAL
Basophils Absolute: 0 10*3/uL (ref 0.0–0.1)
Basophils Relative: 0 % (ref 0–1)
Eosinophils Absolute: 0.1 10*3/uL (ref 0.0–0.7)
Eosinophils Relative: 1 % (ref 0–5)
Lymphocytes Relative: 18 % (ref 12–46)
Lymphs Abs: 2.4 10*3/uL (ref 0.7–4.0)
Monocytes Absolute: 1.4 10*3/uL — ABNORMAL HIGH (ref 0.1–1.0)
Monocytes Relative: 11 % (ref 3–12)
Neutro Abs: 9.2 10*3/uL — ABNORMAL HIGH (ref 1.7–7.7)
Neutrophils Relative %: 71 % (ref 43–77)

## 2011-02-03 LAB — CBC
HCT: 31.6 % — ABNORMAL LOW (ref 36.0–46.0)
Hemoglobin: 9.9 g/dL — ABNORMAL LOW (ref 12.0–15.0)
MCH: 24.1 pg — ABNORMAL LOW (ref 26.0–34.0)
MCHC: 31.3 g/dL (ref 30.0–36.0)
MCV: 76.9 fL — ABNORMAL LOW (ref 78.0–100.0)
Platelets: 581 10*3/uL — ABNORMAL HIGH (ref 150–400)
RBC: 4.11 MIL/uL (ref 3.87–5.11)
RDW: 14.2 % (ref 11.5–15.5)
WBC: 13.1 10*3/uL — ABNORMAL HIGH (ref 4.0–10.5)

## 2011-02-03 LAB — URINE CULTURE
Colony Count: 15000
Culture  Setup Time: 201207210410

## 2011-02-03 LAB — HEMOGLOBIN A1C
Hgb A1c MFr Bld: 8 % — ABNORMAL HIGH (ref <5.7)
Mean Plasma Glucose: 183 mg/dL — ABNORMAL HIGH (ref <117)

## 2011-02-03 NOTE — H&P (Signed)
[name], [name]                 ACCOUNT NO.:  [id number]  MEDICAL RECORD NO.:  [id number]  LOCATION:  1538                         FACILITY:  [facility]  PHYSICIAN:  Thornton Park. Daphine Deutscher, MD  DATE OF BIRTH:  12-Apr-1961  DATE OF ADMISSION:  02/01/2011 DATE OF DISCHARGE:                             HISTORY & PHYSICAL   ADMISSION DIAGNOSES: 1. Fever. 2. Abdominal pain status post pancreatectomy.  HISTORY:  This is a 50 year old African-American female who was discharged from [facility] on January 14, 2011, having had a laparoscopic assisted distal pancreatectomy, splenectomy, along with a lap chole cystectomy on the January 09, 2011.  Pathology proved this lesion in the pancreas to be a serous cystadenoma of the pancreas and she had chronic cholecystitis.  She had a Jackson-Pratt drain placed in the distal pancreatic bed.  Within the last several days, she has began having some low-grade fevers that have gotten higher along with some pain in her abdomen and some difficulty with deep breath.  She worked on her incentive spirometry at home but when she noticed the characteristic of her drainage going from fairly clear to more cloudy, she became concerned, call the office and was referred to the [facility] ER for evaluation where she was seen by Dr. Patria Mane and subsequently by me after admission for observation.  PAST MEDICAL HISTORY: Discharge medications include: 1. Percocet for pain. 2. Lisinopril hydrochlorothiazide for blood pressure. 3. Metformin 500 q.h.s. for her diabetes. 4. Pravastatin 40 mg q.a.m. 5. Ibuprofen 600 mg as needed. 6. Vitamin D.  PRIMARY CARE PHYSICIAN:  The patient has no PCP.  MEDICAL CONDITIONS:  Her medical conditions include diabetes, hyperlipidemia, somewhat metabolic syndrome and obesity.  SOCIAL HISTORY:  She is a nonsmoker.  No drug abuse.  Occasionally drinks.  ALLERGIES:  She has no known drug allergies.  REVIEW OF SYSTEMS:  Negative  except as noted in the history of present illness.  PHYSICAL EXAM:  GENERAL:  Mildly obese African-American female who appears in no acute distress. HEENT EXAM:  Normocephalic.  Eyes:  Sclerae nonicteric.  Pupils equal, round and reactive to light.  Nose and throat exam unremarkable. NECK: Supple. CHEST:  Heart is sinus rhythm without murmurs or gallops. LUNGS:  Clear. ABDOMEN:  She has dressed area where her laparoscopic site is.  A little bit of breakdown of incision but not much.  There is no erythema.  She has no rebound or guarding.  Laterally on the left side there is a JP drain with what appears to be more consistent with  pancreatic bed drainage.  Does not appear to be copious but it could be a little more cloudy suggestive of smoldering infection. EXTREMITIES:  Full range of motion.  No swelling, cyanosis, edema or clubbing noted.  CT scan shows that she is status post splenectomy, distal pancreatectomy.  There is a fluid collection within the surgical bed without any really significant change.  There is a catheter in this area.  She has developed a slight left-sided pleural effusion.  LABORATORY:  Remarkable glucose 179 and her white count is 13500 with hemoglobin 10.9.  Platelet count is 648 as would be expected after  the splenectomy.  IMPRESSION:  Possible infected pancreatic bed site versus focal atelectasis in the left lower lobe with small purulent fusion.  We will work on  pulmonary toilet.  In the meantime, we will cover her with antibiotics and observe the character of the JP drainage as well as reassess her blood count.     Thornton Park Daphine Deutscher, MD     MBM/MEDQ  D:  02/02/2011  T:  02/02/2011  Job:  [id number]  Electronically Signed by Luretha Murphy MD on 02/03/2011 10:43:49 PM

## 2011-02-04 ENCOUNTER — Observation Stay (HOSPITAL_COMMUNITY): Payer: 59

## 2011-02-04 LAB — DIFFERENTIAL
Basophils Absolute: 0 10*3/uL (ref 0.0–0.1)
Basophils Relative: 0 % (ref 0–1)
Eosinophils Absolute: 0.3 10*3/uL (ref 0.0–0.7)
Eosinophils Relative: 2 % (ref 0–5)
Lymphocytes Relative: 21 % (ref 12–46)
Lymphs Abs: 3.1 10*3/uL (ref 0.7–4.0)
Monocytes Absolute: 1.8 10*3/uL — ABNORMAL HIGH (ref 0.1–1.0)
Monocytes Relative: 12 % (ref 3–12)
Neutro Abs: 9.9 10*3/uL — ABNORMAL HIGH (ref 1.7–7.7)
Neutrophils Relative %: 65 % (ref 43–77)

## 2011-02-04 LAB — CBC
HCT: 31.6 % — ABNORMAL LOW (ref 36.0–46.0)
Hemoglobin: 10 g/dL — ABNORMAL LOW (ref 12.0–15.0)
MCH: 24.4 pg — ABNORMAL LOW (ref 26.0–34.0)
MCHC: 31.6 g/dL (ref 30.0–36.0)
MCV: 77.1 fL — ABNORMAL LOW (ref 78.0–100.0)
Platelets: 532 10*3/uL — ABNORMAL HIGH (ref 150–400)
RBC: 4.1 MIL/uL (ref 3.87–5.11)
RDW: 14.1 % (ref 11.5–15.5)
WBC: 15.2 10*3/uL — ABNORMAL HIGH (ref 4.0–10.5)

## 2011-02-04 LAB — GLUCOSE, CAPILLARY
Glucose-Capillary: 137 mg/dL — ABNORMAL HIGH (ref 70–99)
Glucose-Capillary: 163 mg/dL — ABNORMAL HIGH (ref 70–99)
Glucose-Capillary: 164 mg/dL — ABNORMAL HIGH (ref 70–99)
Glucose-Capillary: 186 mg/dL — ABNORMAL HIGH (ref 70–99)
Glucose-Capillary: 191 mg/dL — ABNORMAL HIGH (ref 70–99)

## 2011-02-05 LAB — CBC
HCT: 32.2 % — ABNORMAL LOW (ref 36.0–46.0)
Hemoglobin: 10 g/dL — ABNORMAL LOW (ref 12.0–15.0)
MCH: 24 pg — ABNORMAL LOW (ref 26.0–34.0)
MCHC: 31.1 g/dL (ref 30.0–36.0)
MCV: 77.2 fL — ABNORMAL LOW (ref 78.0–100.0)
Platelets: 555 10*3/uL — ABNORMAL HIGH (ref 150–400)
RBC: 4.17 MIL/uL (ref 3.87–5.11)
RDW: 14.5 % (ref 11.5–15.5)
WBC: 14.6 10*3/uL — ABNORMAL HIGH (ref 4.0–10.5)

## 2011-02-05 LAB — BASIC METABOLIC PANEL
BUN: 6 mg/dL (ref 6–23)
CO2: 29 mEq/L (ref 19–32)
Calcium: 9.4 mg/dL (ref 8.4–10.5)
Chloride: 96 mEq/L (ref 96–112)
Creatinine, Ser: 0.71 mg/dL (ref 0.50–1.10)
GFR calc Af Amer: >60 mL/min (ref >60)
GFR calc non Af Amer: >60 mL/min (ref >60)
Glucose, Bld: 179 mg/dL — ABNORMAL HIGH (ref 70–99)
Potassium: 3.8 mEq/L (ref 3.5–5.1)
Sodium: 132 mEq/L — ABNORMAL LOW (ref 135–145)

## 2011-02-05 LAB — GLUCOSE, CAPILLARY
Glucose-Capillary: 184 mg/dL — ABNORMAL HIGH (ref 70–99)
Glucose-Capillary: 239 mg/dL — ABNORMAL HIGH (ref 70–99)
Glucose-Capillary: 182 mg/dL — ABNORMAL HIGH (ref 70–99)
Glucose-Capillary: 169 mg/dL — ABNORMAL HIGH (ref 70–99)
Glucose-Capillary: 232 mg/dL — ABNORMAL HIGH (ref 70–99)

## 2011-02-05 LAB — MAGNESIUM: Magnesium: 2.5 mg/dL (ref 1.5–2.5)

## 2011-02-05 LAB — PHOSPHORUS: Phosphorus: 4.1 mg/dL (ref 2.3–4.6)

## 2011-02-06 LAB — CBC
HCT: 31 % — ABNORMAL LOW (ref 36.0–46.0)
Hemoglobin: 9.6 g/dL — ABNORMAL LOW (ref 12.0–15.0)
MCH: 23.8 pg — ABNORMAL LOW (ref 26.0–34.0)
MCHC: 31 g/dL (ref 30.0–36.0)
MCV: 76.7 fL — ABNORMAL LOW (ref 78.0–100.0)
Platelets: 546 10*3/uL — ABNORMAL HIGH (ref 150–400)
RBC: 4.04 MIL/uL (ref 3.87–5.11)
RDW: 14.4 % (ref 11.5–15.5)
WBC: 18.9 10*3/uL — ABNORMAL HIGH (ref 4.0–10.5)

## 2011-02-06 LAB — GLUCOSE, CAPILLARY
Glucose-Capillary: 126 mg/dL — ABNORMAL HIGH (ref 70–99)
Glucose-Capillary: 151 mg/dL — ABNORMAL HIGH (ref 70–99)
Glucose-Capillary: 152 mg/dL — ABNORMAL HIGH (ref 70–99)
Glucose-Capillary: 159 mg/dL — ABNORMAL HIGH (ref 70–99)
Glucose-Capillary: 170 mg/dL — ABNORMAL HIGH (ref 70–99)

## 2011-02-07 ENCOUNTER — Encounter (INDEPENDENT_AMBULATORY_CARE_PROVIDER_SITE_OTHER): Payer: 59 | Admitting: General Surgery

## 2011-02-07 ENCOUNTER — Inpatient Hospital Stay (HOSPITAL_COMMUNITY): Payer: 59

## 2011-02-07 LAB — CBC
HCT: 28.6 % — ABNORMAL LOW (ref 36.0–46.0)
Hemoglobin: 8.8 g/dL — ABNORMAL LOW (ref 12.0–15.0)
MCH: 23.6 pg — ABNORMAL LOW (ref 26.0–34.0)
MCHC: 30.8 g/dL (ref 30.0–36.0)
MCV: 76.7 fL — ABNORMAL LOW (ref 78.0–100.0)
Platelets: 509 10*3/uL — ABNORMAL HIGH (ref 150–400)
RBC: 3.73 MIL/uL — ABNORMAL LOW (ref 3.87–5.11)
RDW: 14.4 % (ref 11.5–15.5)
WBC: 18.2 10*3/uL — ABNORMAL HIGH (ref 4.0–10.5)

## 2011-02-07 LAB — GLUCOSE, CAPILLARY
Glucose-Capillary: 140 mg/dL — ABNORMAL HIGH (ref 70–99)
Glucose-Capillary: 141 mg/dL — ABNORMAL HIGH (ref 70–99)
Glucose-Capillary: 147 mg/dL — ABNORMAL HIGH (ref 70–99)
Glucose-Capillary: 149 mg/dL — ABNORMAL HIGH (ref 70–99)
Glucose-Capillary: 174 mg/dL — ABNORMAL HIGH (ref 70–99)
Glucose-Capillary: 181 mg/dL — ABNORMAL HIGH (ref 70–99)
Glucose-Capillary: 192 mg/dL — ABNORMAL HIGH (ref 70–99)

## 2011-02-07 LAB — CULTURE, ROUTINE-ABSCESS

## 2011-02-08 LAB — URINALYSIS, ROUTINE W REFLEX MICROSCOPIC
Bilirubin Urine: NEGATIVE
Glucose, UA: NEGATIVE mg/dL
Hgb urine dipstick: NEGATIVE
Ketones, ur: NEGATIVE mg/dL
Leukocytes, UA: NEGATIVE
Nitrite: NEGATIVE
Protein, ur: NEGATIVE mg/dL
Specific Gravity, Urine: 1.019 (ref 1.005–1.030)
Urobilinogen, UA: 0.2 mg/dL (ref 0.0–1.0)
pH: 5 (ref 5.0–8.0)

## 2011-02-08 LAB — CULTURE, BLOOD (ROUTINE X 2)
Culture  Setup Time: 201207210216
Culture  Setup Time: 201207210216
Culture: NO GROWTH
Culture: NO GROWTH

## 2011-02-08 LAB — GLUCOSE, CAPILLARY
Glucose-Capillary: 130 mg/dL — ABNORMAL HIGH (ref 70–99)
Glucose-Capillary: 132 mg/dL — ABNORMAL HIGH (ref 70–99)
Glucose-Capillary: 136 mg/dL — ABNORMAL HIGH (ref 70–99)
Glucose-Capillary: 149 mg/dL — ABNORMAL HIGH (ref 70–99)
Glucose-Capillary: 162 mg/dL — ABNORMAL HIGH (ref 70–99)
Glucose-Capillary: 175 mg/dL — ABNORMAL HIGH (ref 70–99)

## 2011-02-08 LAB — CBC
HCT: 27.6 % — ABNORMAL LOW (ref 36.0–46.0)
Hemoglobin: 8.8 g/dL — ABNORMAL LOW (ref 12.0–15.0)
MCH: 24.4 pg — ABNORMAL LOW (ref 26.0–34.0)
MCHC: 31.9 g/dL (ref 30.0–36.0)
MCV: 76.5 fL — ABNORMAL LOW (ref 78.0–100.0)
Platelets: 522 10*3/uL — ABNORMAL HIGH (ref 150–400)
RBC: 3.61 MIL/uL — ABNORMAL LOW (ref 3.87–5.11)
RDW: 14.3 % (ref 11.5–15.5)
WBC: 18.5 10*3/uL — ABNORMAL HIGH (ref 4.0–10.5)

## 2011-02-09 LAB — URINE CULTURE
Colony Count: NO GROWTH
Culture  Setup Time: 201207271807
Culture: NO GROWTH
Special Requests: NEGATIVE

## 2011-02-09 LAB — GLUCOSE, CAPILLARY
Glucose-Capillary: 126 mg/dL — ABNORMAL HIGH (ref 70–99)
Glucose-Capillary: 143 mg/dL — ABNORMAL HIGH (ref 70–99)
Glucose-Capillary: 145 mg/dL — ABNORMAL HIGH (ref 70–99)
Glucose-Capillary: 146 mg/dL — ABNORMAL HIGH (ref 70–99)
Glucose-Capillary: 152 mg/dL — ABNORMAL HIGH (ref 70–99)
Glucose-Capillary: 155 mg/dL — ABNORMAL HIGH (ref 70–99)

## 2011-02-09 LAB — CBC
HCT: 28.8 % — ABNORMAL LOW (ref 36.0–46.0)
Hemoglobin: 8.8 g/dL — ABNORMAL LOW (ref 12.0–15.0)
MCH: 23.6 pg — ABNORMAL LOW (ref 26.0–34.0)
MCHC: 30.6 g/dL (ref 30.0–36.0)
MCV: 77.2 fL — ABNORMAL LOW (ref 78.0–100.0)
Platelets: 549 10*3/uL — ABNORMAL HIGH (ref 150–400)
RBC: 3.73 MIL/uL — ABNORMAL LOW (ref 3.87–5.11)
RDW: 14.4 % (ref 11.5–15.5)
WBC: 19.2 10*3/uL — ABNORMAL HIGH (ref 4.0–10.5)

## 2011-02-10 ENCOUNTER — Inpatient Hospital Stay (HOSPITAL_COMMUNITY): Payer: 59

## 2011-02-10 LAB — CBC
HCT: 29 % — ABNORMAL LOW (ref 36.0–46.0)
Hemoglobin: 9.1 g/dL — ABNORMAL LOW (ref 12.0–15.0)
MCH: 24.1 pg — ABNORMAL LOW (ref 26.0–34.0)
MCHC: 31.4 g/dL (ref 30.0–36.0)
MCV: 76.9 fL — ABNORMAL LOW (ref 78.0–100.0)
Platelets: 593 10*3/uL — ABNORMAL HIGH (ref 150–400)
RBC: 3.77 MIL/uL — ABNORMAL LOW (ref 3.87–5.11)
RDW: 14.6 % (ref 11.5–15.5)
WBC: 20.3 10*3/uL — ABNORMAL HIGH (ref 4.0–10.5)

## 2011-02-10 LAB — GLUCOSE, CAPILLARY
Glucose-Capillary: 228 mg/dL — ABNORMAL HIGH (ref 70–99)
Glucose-Capillary: 113 mg/dL — ABNORMAL HIGH (ref 70–99)
Glucose-Capillary: 152 mg/dL — ABNORMAL HIGH (ref 70–99)
Glucose-Capillary: 115 mg/dL — ABNORMAL HIGH (ref 70–99)
Glucose-Capillary: 136 mg/dL — ABNORMAL HIGH (ref 70–99)

## 2011-02-10 LAB — CULTURE, BLOOD (ROUTINE X 2)
Culture  Setup Time: 201207230100
Culture  Setup Time: 201207230100
Culture: NO GROWTH
Culture: NO GROWTH

## 2011-02-10 MED ORDER — IOHEXOL 300 MG/ML  SOLN
125.0000 mL | Freq: Once | INTRAMUSCULAR | Status: AC | PRN
  Administered 2011-02-10: 125 mL via INTRAVENOUS

## 2011-02-11 ENCOUNTER — Inpatient Hospital Stay (HOSPITAL_COMMUNITY): Payer: 59

## 2011-02-11 LAB — GLUCOSE, CAPILLARY
Glucose-Capillary: 107 mg/dL — ABNORMAL HIGH (ref 70–99)
Glucose-Capillary: 129 mg/dL — ABNORMAL HIGH (ref 70–99)
Glucose-Capillary: 132 mg/dL — ABNORMAL HIGH (ref 70–99)
Glucose-Capillary: 139 mg/dL — ABNORMAL HIGH (ref 70–99)
Glucose-Capillary: 142 mg/dL — ABNORMAL HIGH (ref 70–99)
Glucose-Capillary: 150 mg/dL — ABNORMAL HIGH (ref 70–99)
Glucose-Capillary: 157 mg/dL — ABNORMAL HIGH (ref 70–99)

## 2011-02-12 LAB — GLUCOSE, CAPILLARY
Glucose-Capillary: 139 mg/dL — ABNORMAL HIGH (ref 70–99)
Glucose-Capillary: 145 mg/dL — ABNORMAL HIGH (ref 70–99)
Glucose-Capillary: 151 mg/dL — ABNORMAL HIGH (ref 70–99)
Glucose-Capillary: 129 mg/dL — ABNORMAL HIGH (ref 70–99)
Glucose-Capillary: 123 mg/dL — ABNORMAL HIGH (ref 70–99)
Glucose-Capillary: 152 mg/dL — ABNORMAL HIGH (ref 70–99)

## 2011-02-13 ENCOUNTER — Other Ambulatory Visit (INDEPENDENT_AMBULATORY_CARE_PROVIDER_SITE_OTHER): Payer: Self-pay | Admitting: General Surgery

## 2011-02-13 DIAGNOSIS — K8689 Other specified diseases of pancreas: Secondary | ICD-10-CM

## 2011-02-13 LAB — CBC
HCT: 28.3 % — ABNORMAL LOW (ref 36.0–46.0)
Hemoglobin: 8.7 g/dL — ABNORMAL LOW (ref 12.0–15.0)
MCH: 23.7 pg — ABNORMAL LOW (ref 26.0–34.0)
MCHC: 30.7 g/dL (ref 30.0–36.0)
MCV: 77.1 fL — ABNORMAL LOW (ref 78.0–100.0)
Platelets: 592 10*3/uL — ABNORMAL HIGH (ref 150–400)
RBC: 3.67 MIL/uL — ABNORMAL LOW (ref 3.87–5.11)
RDW: 14.5 % (ref 11.5–15.5)
WBC: 17.7 10*3/uL — ABNORMAL HIGH (ref 4.0–10.5)

## 2011-02-13 LAB — GLUCOSE, CAPILLARY
Glucose-Capillary: 120 mg/dL — ABNORMAL HIGH (ref 70–99)
Glucose-Capillary: 123 mg/dL — ABNORMAL HIGH (ref 70–99)
Glucose-Capillary: 148 mg/dL — ABNORMAL HIGH (ref 70–99)

## 2011-02-13 LAB — BASIC METABOLIC PANEL
BUN: 6 mg/dL (ref 6–23)
CO2: 31 mEq/L (ref 19–32)
Calcium: 9.1 mg/dL (ref 8.4–10.5)
Chloride: 97 mEq/L (ref 96–112)
Creatinine, Ser: 0.77 mg/dL (ref 0.50–1.10)
GFR calc Af Amer: >60 mL/min (ref >60)
GFR calc non Af Amer: >60 mL/min (ref >60)
Glucose, Bld: 139 mg/dL — ABNORMAL HIGH (ref 70–99)
Potassium: 3.9 mEq/L (ref 3.5–5.1)
Sodium: 136 mEq/L (ref 135–145)

## 2011-02-14 LAB — BODY FLUID CULTURE
Culture: NO GROWTH
Gram Stain: NONE SEEN

## 2011-02-16 LAB — ANAEROBIC CULTURE: Gram Stain: NONE SEEN

## 2011-02-18 NOTE — Discharge Summary (Signed)
  [name], [name]                 ACCOUNT NO.:  [id number]  MEDICAL RECORD NO.:  [id number]  LOCATION:  1538                         FACILITY:  [facility]  PHYSICIAN:  Almond Lint, MD       DATE OF BIRTH:  27-Nov-1960  DATE OF ADMISSION:  02/01/2011 DATE OF DISCHARGE:  02/13/2011                              DISCHARGE SUMMARY   PRINCIPAL DIAGNOSIS:  Postoperative abscess.  SECONDARY DIAGNOSES:  Include chronic cholecystitis and serous cystadenoma of the pancreas.  PRINCIPAL PROCEDURE:  Percutaneous drainage of left subphrenic collection and an abdominal wall collection, performed in Radiology.  PRINCIPAL LABS:  Prior to discharge, white count came back down to 17. Cultures are negative.  Blood glucose is 130.  Electrolytes within normal limits.  DISCHARGE MEDICATIONS: 1. Lisinopril/hydrochlorothiazide 20/12.5 once a day. 2. Metformin 500 mg twice a day. 3. Pravastatin 40 mg once a day. 4. Oxycodone/acetaminophen 5/325, one to two tablet q.4 h. as needed. 5. Ibuprofen 600 mg q.8 h. as needed. 6. Fluconazole 100 mg daily for vaginal yeast infection. 7. Augmentin 875 mg p.o. b.i.d. x2 weeks. 8. Colace 100 mg twice a day. 9. Milk of magnesia 30 mL as needed for constipation. 10.MiraLax 17 g as needed for constipation. 11.Phenergan 12.5 mg 1 to 2 tablets p.o. q.6 h. p.r.n. nausea and     vomiting. 12.Sodium chloride flush for drain.  HOSPITAL COURSE:  [name] was admitted to [facility] with some fevers and shortness of breath.  She had a scan that showed fluid collection.  Her surgical Jackson-Pratt drain was at the bottom of this fluid collection where it remained.  She received an additional drain in this area which had her improving.  She was going to be going home when her white count went up and she continued to spike fevers.  She was rescanned, and her abdominal wall collection was then drained.  Her white count came down and she was feeling better and eating  better.  The patient is going to follow with me in 2 weeks.  She is also going to get a CT scan. Discharged to home with drain teaching.  Not to do any heavy lifting or strenuous activity.     Almond Lint, MD     FB/MEDQ  D:  02/13/2011  T:  02/13/2011  Job:  [id number]  Electronically Signed by Almond Lint MD on 02/18/2011 01:47:20 PM

## 2011-02-27 ENCOUNTER — Ambulatory Visit (INDEPENDENT_AMBULATORY_CARE_PROVIDER_SITE_OTHER): Payer: 59 | Admitting: General Surgery

## 2011-02-27 ENCOUNTER — Encounter (INDEPENDENT_AMBULATORY_CARE_PROVIDER_SITE_OTHER): Payer: Self-pay | Admitting: General Surgery

## 2011-02-27 ENCOUNTER — Encounter (INDEPENDENT_AMBULATORY_CARE_PROVIDER_SITE_OTHER): Payer: Self-pay

## 2011-02-27 VITALS — BP 146/96 | HR 80 | Temp 96.6°F | Ht 66.0 in | Wt 274.2 lb

## 2011-02-27 DIAGNOSIS — D279 Benign neoplasm of unspecified ovary: Secondary | ICD-10-CM

## 2011-02-27 NOTE — Progress Notes (Signed)
Subjective:     Patient ID: [name], female   DOB: 1960-10-17, 50 y.o.   MRN: [id number]  HPI Pt doing better.  No more fevers/chills.  Appetite and energy improving.  Drain output clearing and output <5 mL/day in each drain.  Foul odor has gone away.  No nausea or vomiting.    Review of Systems     Objective:   Physical Exam Abd soft, non tender, non distended A&O times 3, NAD Incision soft, non tender, no erythema Drains serous, scant.    Assessment:     Serous cystadenoma of pancreas Drains clear and output minimal. Drains all pulled.  Follow up with me in 3-4 weeks. Call if fevers recur.          Plan:

## 2011-02-27 NOTE — Assessment & Plan Note (Signed)
Drains clear and output minimal. Drains all pulled.  Follow up with me in 3-4 weeks. Call if fevers recur.

## 2011-03-04 ENCOUNTER — Encounter (INDEPENDENT_AMBULATORY_CARE_PROVIDER_SITE_OTHER): Payer: 59 | Admitting: General Surgery

## 2011-03-08 ENCOUNTER — Encounter (INDEPENDENT_AMBULATORY_CARE_PROVIDER_SITE_OTHER): Payer: 59 | Admitting: General Surgery

## 2011-03-13 ENCOUNTER — Other Ambulatory Visit: Payer: Self-pay | Admitting: Family Medicine

## 2011-03-13 ENCOUNTER — Ambulatory Visit (HOSPITAL_COMMUNITY)
Admission: RE | Admit: 2011-03-13 | Discharge: 2011-03-13 | Disposition: A | Discharge status: Home / Self Care | Payer: 59 | Source: Ambulatory Visit | Attending: Family Medicine | Admitting: Family Medicine

## 2011-03-13 DIAGNOSIS — J90 Pleural effusion, not elsewhere classified: Secondary | ICD-10-CM | POA: Insufficient documentation

## 2011-03-13 DIAGNOSIS — M549 Dorsalgia, unspecified: Secondary | ICD-10-CM | POA: Insufficient documentation

## 2011-03-13 DIAGNOSIS — R0602 Shortness of breath: Secondary | ICD-10-CM

## 2011-03-13 DIAGNOSIS — I319 Disease of pericardium, unspecified: Secondary | ICD-10-CM | POA: Insufficient documentation

## 2011-03-13 DIAGNOSIS — R059 Cough, unspecified: Secondary | ICD-10-CM | POA: Insufficient documentation

## 2011-03-13 DIAGNOSIS — J9819 Other pulmonary collapse: Secondary | ICD-10-CM | POA: Insufficient documentation

## 2011-03-13 DIAGNOSIS — R05 Cough: Secondary | ICD-10-CM | POA: Insufficient documentation

## 2011-03-13 LAB — BUN: BUN: 9 mg/dL (ref 6–23)

## 2011-03-13 LAB — CALCIUM: Calcium: 10.3 mg/dL (ref 8.4–10.5)

## 2011-03-13 LAB — GLUCOSE, RANDOM: Glucose, Bld: 114 mg/dL — ABNORMAL HIGH (ref 70–99)

## 2011-03-13 MED ORDER — IOHEXOL 300 MG/ML  SOLN
100.0000 mL | Freq: Once | INTRAMUSCULAR | Status: AC | PRN
  Administered 2011-03-13: 100 mL via INTRAVENOUS

## 2011-03-20 ENCOUNTER — Ambulatory Visit (INDEPENDENT_AMBULATORY_CARE_PROVIDER_SITE_OTHER): Payer: 59 | Admitting: General Surgery

## 2011-03-20 VITALS — BP 132/86 | HR 68 | Temp 97.4°F

## 2011-03-20 DIAGNOSIS — D279 Benign neoplasm of unspecified ovary: Secondary | ICD-10-CM

## 2011-03-20 DIAGNOSIS — J90 Pleural effusion, not elsewhere classified: Secondary | ICD-10-CM | POA: Insufficient documentation

## 2011-03-20 MED ORDER — FUROSEMIDE 40 MG PO TABS: 40.0000 mg | ORAL_TABLET | Freq: Every day | ORAL | Status: DC

## 2011-03-20 NOTE — Progress Notes (Signed)
HISTORY: Pt had difficulty breathing last week and went to ED.  She had a chest CT with a pericardial and pleural effusion.  She was advised to do inhalers and incentive spirometry.  She has had some improvement, but still has a decreased energy level and decreased exercise tolerance.  She gets winded if she has to speak for too long.      EXAM: Head: Normocephalic and atraumatic.  Eyes:  Conjunctivae are normal. Pupils are equal, round, and reactive to light. No scleral icterus.  Resp: No respiratory distress, normal effort.  Decreased breath sounds L base.   Abd: Soft. Bowel sounds are normal. Abdomen is soft, non distended and non tender No masses are palpable.  There is no rebound and no guarding.  Neurological: Alert and oriented to person, place, and time. Coordination normal.  Skin: Skin is warm and dry. No rash noted. No diaphoretic. No erythema. No pallor.  Psychiatric: Normal mood and affect. Normal behavior. Judgment and thought content normal.    ASSESSMENT AND PLAN:   Pleural effusion 1 week of lasix treatment. Continue inhalers and incentive spirometry. Follow up chest xray in 2-3 weeks.    Serous cystadenoma of pancreas No fevers. No evidence of fluid collection in upper abdomen on chest CT done last week.   Would not get abdominal CT unless fevers or abdominal symptoms recur.         Maudry Diego, MD Surgical Oncology, General & Endocrine Surgery [facility]  [place], PA, PA Ricka Burdock, [place]

## 2011-03-20 NOTE — Assessment & Plan Note (Signed)
No fevers. No evidence of fluid collection in upper abdomen on chest CT done last week.   Would not get abdominal CT unless fevers or abdominal symptoms recur.

## 2011-03-20 NOTE — Assessment & Plan Note (Signed)
1 week of lasix treatment. Continue inhalers and incentive spirometry. Follow up chest xray in 2-3 weeks.

## 2011-04-03 ENCOUNTER — Ambulatory Visit
Admission: RE | Admit: 2011-04-03 | Discharge: 2011-04-03 | Disposition: A | Discharge status: Home / Self Care | Payer: 59 | Source: Ambulatory Visit | Attending: General Surgery | Admitting: General Surgery

## 2011-04-03 DIAGNOSIS — J90 Pleural effusion, not elsewhere classified: Secondary | ICD-10-CM

## 2011-04-23 ENCOUNTER — Ambulatory Visit (INDEPENDENT_AMBULATORY_CARE_PROVIDER_SITE_OTHER): Payer: 59 | Admitting: General Surgery

## 2011-04-23 ENCOUNTER — Encounter (INDEPENDENT_AMBULATORY_CARE_PROVIDER_SITE_OTHER): Payer: Self-pay | Admitting: General Surgery

## 2011-04-23 VITALS — BP 128/84 | HR 72 | Temp 97.3°F | Resp 20 | Ht 66.0 in | Wt 279.5 lb

## 2011-04-23 DIAGNOSIS — D279 Benign neoplasm of unspecified ovary: Secondary | ICD-10-CM

## 2011-04-23 NOTE — Assessment & Plan Note (Signed)
Patient doing better. Having no shortness of breath. Still experiencing some fluid retention. Has costochondritis, recommend ibuprofen 600 mg TID times 1 week. Follow up in 6 months.

## 2011-04-23 NOTE — Patient Instructions (Addendum)
Call for problems Take ibuprofen 600 mg po three times a day times 1 week.

## 2011-04-23 NOTE — Progress Notes (Signed)
HISTORY: Patient continues to improve.  She is taking an occasional Advil.  She does not have shortness of breath anymore.  She occasionally has a pain in the left back, but this is minor.  She denies nausea/vomiting.  She is not having fevers/ chills.     PERTINENT REVIEW OF SYSTEMS: Otherwise negative.     EXAM: Head: Normocephalic and atraumatic.  Eyes:  Conjunctivae are normal. Pupils are equal, round, and reactive to light. No scleral icterus.  Resp: No respiratory distress, normal effort. Abd: Soft. Abdomen is soft, non distended and non tender.  Costal margin tender on the left.  No sign of hernia.   No masses are palpable.  There is no rebound and no guarding.  Neurological: Alert and oriented to person, place, and time. Coordination normal.  Skin: Skin is warm and dry. No rash noted. No diaphoretic. No erythema. No pallor.  Psychiatric: Normal mood and affect. Normal behavior. Judgment and thought content normal.    ASSESSMENT AND PLAN: Serous cystadenoma of pancreas Patient doing better. Having no shortness of breath. Still experiencing some fluid retention. Has costochondritis, recommend ibuprofen 600 mg TID times 1 week. Follow up in 6 months.         Maudry Diego, MD Surgical Oncology, General & Endocrine Surgery [facility]  [place], [place], [place] Ricka Burdock, [place]

## 2011-08-20 ENCOUNTER — Ambulatory Visit (INDEPENDENT_AMBULATORY_CARE_PROVIDER_SITE_OTHER): Payer: 59 | Admitting: Internal Medicine

## 2011-08-20 DIAGNOSIS — R300 Dysuria: Secondary | ICD-10-CM

## 2011-08-20 DIAGNOSIS — R197 Diarrhea, unspecified: Secondary | ICD-10-CM

## 2011-08-20 DIAGNOSIS — K8689 Other specified diseases of pancreas: Secondary | ICD-10-CM

## 2011-08-20 DIAGNOSIS — I10 Essential (primary) hypertension: Secondary | ICD-10-CM

## 2011-08-20 DIAGNOSIS — IMO0001 Reserved for inherently not codable concepts without codable children: Secondary | ICD-10-CM

## 2011-08-20 DIAGNOSIS — E785 Hyperlipidemia, unspecified: Secondary | ICD-10-CM

## 2011-08-20 DIAGNOSIS — E1169 Type 2 diabetes mellitus with other specified complication: Secondary | ICD-10-CM

## 2011-08-20 DIAGNOSIS — R5383 Other fatigue: Secondary | ICD-10-CM

## 2011-08-20 DIAGNOSIS — J449 Chronic obstructive pulmonary disease, unspecified: Secondary | ICD-10-CM

## 2011-08-20 LAB — POCT URINALYSIS DIPSTICK
Bilirubin, UA: NEGATIVE
Blood, UA: NEGATIVE
Glucose, UA: NEGATIVE
Ketones, UA: NEGATIVE
Leukocytes, UA: NEGATIVE
Nitrite, UA: NEGATIVE
Protein, UA: NEGATIVE
Spec Grav, UA: 1.025
Urobilinogen, UA: 0.2
pH, UA: 5

## 2011-08-20 LAB — LIPID PANEL
Cholesterol: 207 mg/dL — ABNORMAL HIGH (ref 0–200)
HDL: 57 mg/dL (ref >39)
LDL Cholesterol: 136 mg/dL — ABNORMAL HIGH (ref 0–99)
Total CHOL/HDL Ratio: 3.6 Ratio
Triglycerides: 69 mg/dL (ref <150)
VLDL: 14 mg/dL (ref 0–40)

## 2011-08-20 LAB — POCT CBC
Granulocyte percent: 45.4 %G (ref 37–80)
HCT, POC: 38.3 % (ref 37.7–47.9)
Hemoglobin: 11.9 g/dL — AB (ref 12.2–16.2)
Lymph, poc: 4.1 — AB (ref 0.6–3.4)
MCH, POC: 24.4 pg — AB (ref 27–31.2)
MCHC: 31.1 g/dL — AB (ref 31.8–35.4)
MCV: 78.4 fL — AB (ref 80–97)
MID (cbc): 0.5 (ref 0–0.9)
MPV: 9.8 fL (ref 0–99.8)
POC Granulocyte: 3.9 (ref 2–6.9)
POC LYMPH PERCENT: 48.5 %L (ref 10–50)
POC MID %: 6.1 %M (ref 0–12)
Platelet Count, POC: 387 10*3/uL (ref 142–424)
RBC: 4.88 M/uL (ref 4.04–5.48)
RDW, POC: 15.4 %
WBC: 8.5 10*3/uL (ref 4.6–10.2)

## 2011-08-20 LAB — COMPREHENSIVE METABOLIC PANEL
ALT: 24 U/L (ref 0–35)
AST: 16 U/L (ref 0–37)
Albumin: 5 g/dL (ref 3.5–5.2)
Alkaline Phosphatase: 64 U/L (ref 39–117)
BUN: 11 mg/dL (ref 6–23)
CO2: 25 mEq/L (ref 19–32)
Calcium: 10.1 mg/dL (ref 8.4–10.5)
Chloride: 97 mEq/L (ref 96–112)
Creat: 0.74 mg/dL (ref 0.50–1.10)
Glucose, Bld: 217 mg/dL — ABNORMAL HIGH (ref 70–99)
Potassium: 3.9 mEq/L (ref 3.5–5.3)
Sodium: 137 mEq/L (ref 135–145)
Total Bilirubin: 0.6 mg/dL (ref 0.3–1.2)
Total Protein: 7.8 g/dL (ref 6.0–8.3)

## 2011-08-20 LAB — TSH: TSH: 2.109 u[IU]/mL (ref 0.350–4.500)

## 2011-08-20 LAB — LIPASE: Lipase: 10 U/L (ref 0–75)

## 2011-08-20 LAB — GLUCOSE, POCT (MANUAL RESULT ENTRY): POC Glucose: 233

## 2011-08-20 LAB — POCT GLYCOSYLATED HEMOGLOBIN (HGB A1C): Hemoglobin A1C: 10.2

## 2011-08-20 MED ORDER — METFORMIN HCL 500 MG PO TABS: 500.0000 mg | ORAL_TABLET | Freq: Two times a day (BID) | ORAL | Status: DC

## 2011-08-20 MED ORDER — ERGOCALCIFEROL 1.25 MG (50000 UT) PO CAPS: 50000.0000 [IU] | ORAL_CAPSULE | ORAL | Status: DC

## 2011-08-20 MED ORDER — LISINOPRIL-HYDROCHLOROTHIAZIDE 20-12.5 MG PO TABS: 1.0000 | ORAL_TABLET | Freq: Every day | ORAL | Status: DC

## 2011-08-20 MED ORDER — GLUCOSE BLOOD VI STRP: ORAL_STRIP | Status: DC

## 2011-08-20 MED ORDER — PRAVASTATIN SODIUM 40 MG PO TABS: 40.0000 mg | ORAL_TABLET | Freq: Every day | ORAL | Status: DC

## 2011-08-20 MED ORDER — PANTOPRAZOLE SODIUM 40 MG PO TBEC: 40.0000 mg | DELAYED_RELEASE_TABLET | Freq: Every day | ORAL | Status: DC

## 2011-08-20 MED ORDER — PANCRELIPASE (LIP-PROT-AMYL) 10440-39150 UNITS PO TABS: 1.0000 | ORAL_TABLET | Freq: Three times a day (TID) | ORAL | Status: DC

## 2011-08-20 MED ORDER — ALBUTEROL SULFATE HFA 108 (90 BASE) MCG/ACT IN AERS: 2.0000 | INHALATION_SPRAY | Freq: Four times a day (QID) | RESPIRATORY_TRACT | Status: DC | PRN

## 2011-08-20 MED ORDER — IBUPROFEN 600 MG PO TABS: 600.0000 mg | ORAL_TABLET | Freq: Four times a day (QID) | ORAL | Status: DC | PRN

## 2011-08-20 MED ORDER — ASPIRIN 81 MG PO TBEC: 81.0000 mg | DELAYED_RELEASE_TABLET | Freq: Every day | ORAL | Status: AC

## 2011-08-20 MED ORDER — METFORMIN HCL 1000 MG PO TABS: 1000.0000 mg | ORAL_TABLET | Freq: Two times a day (BID) | ORAL | Status: DC

## 2011-08-20 NOTE — Patient Instructions (Addendum)
Take the pancreatic enzymes 1 tab daily prior to meals esp fatty food. Continue to observe diabetic diet.Increase your metfomin to 1000 mg two times daily . This is 2 of your present dose(500 mg) 2 times daily. When you pick up you renewed rx it will be 1000 mg of metformin per pill so take 1 tab 2 times daily.Return in one week for re eval of labs and adjustment of medication to treat your diabetes.

## 2011-08-20 NOTE — Progress Notes (Signed)
  Subjective:    Patient ID: [name], female    DOB: 1960-11-14, 51 y.o.   MRN: [id number]  HPI [id number] HPI.onset several months ago after ppancreatectomy partial for pancreatic cyst. Stool 3 times dailly not at night no blood. mild abd pain ,mod in severity. Loose not watery, only during the day CC.Also c/o sugars running high HPI acucheck was 270 this am fasting. No weight loss or gain. Urinates 2 times a night.   Review of Systems  Constitutional: Negative.   HENT: Negative.   Eyes: Negative.   Respiratory: Negative.   Cardiovascular: Negative.   Gastrointestinal: Positive for abdominal pain, diarrhea and abdominal distention. Negative for nausea, vomiting, constipation, blood in stool, anal bleeding and rectal pain.  Genitourinary: Positive for flank pain. Negative for dysuria, difficulty urinating, pelvic pain and dyspareunia.  Musculoskeletal: Negative.   Neurological: Negative.   Hematological: Negative.   Psychiatric/Behavioral: The patient is nervous/anxious.        Objective:   Physical Exam  Constitutional: She is oriented to person, place, and time. She appears well-developed and well-nourished.  HENT:  Head: Normocephalic and atraumatic.  Mouth/Throat: Oropharynx is clear and moist.  Eyes: Conjunctivae and EOM are normal. Pupils are equal, round, and reactive to light.  Neck: Neck supple. No tracheal deviation present. No thyromegaly present.  Cardiovascular: Normal rate, regular rhythm and normal heart sounds.   Pulmonary/Chest: No respiratory distress. She has no wheezes. She has no rales. She exhibits no tenderness.  Abdominal: Soft. She exhibits no mass. There is no tenderness. There is no rebound and no guarding.  Musculoskeletal: Normal range of motion.  Lymphadenopathy:    She has no cervical adenopathy.  Neurological: She is alert and oriented to person, place, and time.  Skin: Skin is warm and dry. No rash noted.  Psychiatric: She has a normal  mood and affect.       Became tearful when discussing the benefits of a primary care dr because of hte complexity of her medical problems since th epancreatectomy          Assessment & Plan:  Dx  DIARHHEA PANCREATIC INSSUFFIENCY UNCONTROLLED DIABETES  Plan is to increase the metformin to 1000 mg 2 times daily. Add pancreatic enzymes 1 tab 3 times daily with meals.Add proto nix for added efficiency of enzymes Add aspirin 81 mg 1 tab daily. Return in one week to re eval and to suggest ongoing treatment and evaluation at [facility] with Dr. Audria Nine.

## 2011-08-25 ENCOUNTER — Encounter: Payer: Self-pay | Admitting: Physician Assistant

## 2011-10-28 ENCOUNTER — Encounter (INDEPENDENT_AMBULATORY_CARE_PROVIDER_SITE_OTHER): Payer: Self-pay | Admitting: General Surgery

## 2011-10-28 ENCOUNTER — Ambulatory Visit (INDEPENDENT_AMBULATORY_CARE_PROVIDER_SITE_OTHER): Payer: 59 | Admitting: General Surgery

## 2011-10-28 VITALS — BP 162/94 | HR 70 | Temp 97.4°F | Resp 18 | Ht 66.0 in | Wt 276.2 lb

## 2011-10-28 DIAGNOSIS — R229 Localized swelling, mass and lump, unspecified: Secondary | ICD-10-CM

## 2011-10-28 DIAGNOSIS — R222 Localized swelling, mass and lump, trunk: Secondary | ICD-10-CM

## 2011-10-28 DIAGNOSIS — D279 Benign neoplasm of unspecified ovary: Secondary | ICD-10-CM

## 2011-10-28 NOTE — Progress Notes (Signed)
HISTORY: Patient is a 51 year old female who had a distal pancreatectomy for a serous cystadenoma of the pancreas and a laparoscopic cholecystectomy. This was done for chronic cholecystitis. She had issues postoperatively including a intra-abdominal abscess and a pleural effusion. She is now recovered other than some left upper quadrant pain. These seemed to be focused at her scar sites now. When I saw her last time her ribs were tender, and I diagnosed her with costochondritis. She denies any nausea or vomiting.  She has had on Creon and increase her metformin.   PERTINENT REVIEW OF SYSTEMS: Positive for back mass   EXAM: Head: Normocephalic and atraumatic.  Eyes:  Conjunctivae are normal. Pupils are equal, round, and reactive to light. No scleral icterus.  Neck:  Normal range of motion. Neck supple. No tracheal deviation present. No thyromegaly present.  Resp: No respiratory distress, normal effort. Abd:  Abdomen is soft, non distended. No masses are palpable.  There is no rebound and no guarding. Scars are sensitive.   Neurological: Alert and oriented to person, place, and time. Coordination normal.  Skin: Skin is warm and dry. No rash noted. No diaphoretic. No erythema. No pallor. There is a 5 cm lipoma on left upper back.   Psychiatric: Normal mood and affect. Normal behavior. Judgment and thought content normal.      ASSESSMENT AND PLAN:   Serous cystadenoma of pancreas No need for further followup imaging.  She is taking Creon and insulin at her primary physician's direction. I think her pain is from scar tissue. She has significant keloid over multiple scars.    I will follow her up on an as-needed basis for this issue.  Mass on back It appears that Ms Kretzschmar has a recurrent lipoma in an area where Dr. Maple Hudson took out a lipoma in the past. This seems to be enlarging. It is now starting to hurt and warrants excision. She will call when this is convenient at her  work.       Maudry Diego, MD Surgical Oncology, General & Endocrine Surgery [facility]  [place], [place], [place]a [place], [place]

## 2011-10-28 NOTE — Assessment & Plan Note (Signed)
No need for further followup imaging.  She is taking Creon and insulin at her primary physician's direction. I think her pain is from scar tissue. She has significant keloid over multiple scars.    I will follow her up on an as-needed basis for this issue.

## 2011-10-28 NOTE — Patient Instructions (Signed)
Call when able to schedule removal of back lipoma.

## 2011-10-28 NOTE — Assessment & Plan Note (Signed)
It appears that [name] has a recurrent lipoma in an area where Dr. Maple Hudson took out a lipoma in the past. This seems to be enlarging. It is now starting to hurt and warrants excision. She will call when this is convenient at her work.

## 2011-11-07 ENCOUNTER — Ambulatory Visit: Payer: 59 | Admitting: Family Medicine

## 2011-11-14 ENCOUNTER — Ambulatory Visit (INDEPENDENT_AMBULATORY_CARE_PROVIDER_SITE_OTHER): Payer: 59 | Admitting: Family Medicine

## 2011-11-14 ENCOUNTER — Encounter: Payer: Self-pay | Admitting: Family Medicine

## 2011-11-14 VITALS — BP 132/74 | HR 74 | Temp 98.0°F | Resp 16 | Ht 66.0 in | Wt 267.6 lb

## 2011-11-14 DIAGNOSIS — E1165 Type 2 diabetes mellitus with hyperglycemia: Secondary | ICD-10-CM | POA: Insufficient documentation

## 2011-11-14 DIAGNOSIS — N951 Menopausal and female climacteric states: Secondary | ICD-10-CM

## 2011-11-14 DIAGNOSIS — E119 Type 2 diabetes mellitus without complications: Secondary | ICD-10-CM

## 2011-11-14 DIAGNOSIS — Z780 Asymptomatic menopausal state: Secondary | ICD-10-CM

## 2011-11-14 DIAGNOSIS — E1142 Type 2 diabetes mellitus with diabetic polyneuropathy: Secondary | ICD-10-CM

## 2011-11-14 DIAGNOSIS — G909 Disorder of the autonomic nervous system, unspecified: Secondary | ICD-10-CM

## 2011-11-14 DIAGNOSIS — IMO0001 Reserved for inherently not codable concepts without codable children: Secondary | ICD-10-CM

## 2011-11-14 DIAGNOSIS — Z0000 Encounter for general adult medical examination without abnormal findings: Secondary | ICD-10-CM

## 2011-11-14 LAB — POCT GLYCOSYLATED HEMOGLOBIN (HGB A1C): Hemoglobin A1C: 8

## 2011-11-14 NOTE — Progress Notes (Addendum)
Subjective:    Patient ID: [name], female    DOB: [birth year], 51 y.o.   MRN: [id number]  HPI This 51 y.o. AA female has several chronic medical problems. She was seen at [facility] in  Feb 2013 and advised to establish primary care for management of chronic issues. She has Type II  Diabetes and HbA1C= 10.2% in February. After some major lifestyle changes, she has lost weight  and is compliant with all medications.FSBS avg 130-187; she checks 2x daily.         She has sedentary job, is married with 3 adult children and is a nonsmoker; alcohol consumption  is limited to occasional glass of red wine. She exercises 3x/week.    Last PAP: 12/2010 - normal  MMG: due  CRS: not yet  Diabetic eye exam: 2011   Review of Systems  Constitutional: Positive for activity change and appetite change. Negative for fever, chills and fatigue.  HENT: Positive for sore throat.   Eyes: Negative for photophobia, pain, redness and visual disturbance.  Respiratory: Positive for shortness of breath. Negative for cough, chest tightness and wheezing.   Cardiovascular: Positive for leg swelling. Negative for chest pain and palpitations.  Gastrointestinal: Negative.   Genitourinary: Negative for dysuria, frequency, hematuria, vaginal bleeding and pelvic pain.       Night sweats and trouble sleeping  Musculoskeletal: Positive for arthralgias. Negative for myalgias, joint swelling and gait problem.       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  Neurological: Positive for numbness. Negative for syncope, weakness, light-headedness and headaches.       Numbness in fingers       Objective:   Physical Exam  Nursing note and vitals reviewed. Constitutional: She is oriented to person, place, and time. She appears well-developed and  well-nourished. No distress.       Morbidly obese  HENT:  Head: Normocephalic and atraumatic.  Right Ear: External ear normal.  Left Ear: External ear normal.  Nose: Nose normal.  Mouth/Throat: No oropharyngeal exudate.  Eyes: Conjunctivae and EOM are normal. Pupils are equal, round, and reactive to light. No scleral icterus.  Neck: Normal range of motion. Neck supple. No thyromegaly present.  Cardiovascular: Normal rate, regular rhythm, normal heart sounds and intact distal pulses.  Exam reveals no gallop and no friction rub.   No murmur heard. Pulmonary/Chest: Effort normal and breath sounds normal. No respiratory distress. She has no wheezes. She has no rales. She exhibits no tenderness.  Genitourinary:       Deferred  Musculoskeletal: Normal range of motion. She exhibits no edema and no tenderness.       Left lateral abdominal area near mid-axillary line: 3 distinct well-healed postsurgical scars- nontender with palpation  Lymphadenopathy:    She has no cervical adenopathy.  Neurological: She is alert and oriented to person, place, and time. She has normal reflexes. No cranial nerve deficit. Coordination normal.  Skin: Skin is warm and dry.       Neck (posterior aspect): Hyperpigmentation and thickened Areas c/w acanthosis nigrans  Psychiatric: She has a normal mood and affect. Her behavior is normal. Judgment and thought content normal.    Results for orders placed in visit on 11/14/11  POCT GLYCOSYLATED HEMOGLOBIN (HGB A1C)      Component Value Range  Hemoglobin A1C 8.0           Assessment & Plan:   1. Type II or unspecified type diabetes mellitus without mention of complication, uncontrolled   Microalbumin, urine Continue current medications and lifestyle changes for weight loss  2. Routine general medical examination at a health care facility  MM Digital Screening  3. Menopause  Try Flax seed oil caps 1000-1200 mg  1 cap daily Try herbal combination to relieve  symptoms  4. Obesity, Class III, BMI 40-49.9 (morbid obesity)    5. Diabetic peripheral neuropathy associated with type 2 diabetes mellitus

## 2011-11-14 NOTE — Patient Instructions (Signed)
Menopause Menopause is the normal time of life when menstrual periods stop completely. Menopause is complete when you have missed 12 consecutive menstrual periods. It usually occurs between the ages of 50 to 55, with an average age of 50. Very rarely does a woman develop menopause before 50 years old. At menopause, your ovaries stop producing the female hormones, estrogen and progesterone. This can cause undesirable symptoms and also affect your health. Sometimes the symptoms may occur 4 to 5 years before the menopause begins. There is no relationship between menopause and:  Oral contraceptives.   Number of children you had.   Race.   The age your menstrual periods started (menarche).  Heavy smokers and very thin women may develop menopause earlier in life. CAUSES  The ovaries stop producing the female hormones estrogen and progesterone.   Other causes include:   Surgery to remove both ovaries.   The ovaries stop functioning for no known reason.   Tumors of the pituitary gland in the brain.   Medical disease that affects the ovaries and hormone production.   Radiation treatment to the abdomen or pelvis.   Chemotherapy that affects the ovaries.  SYMPTOMS   Hot flashes.   Night sweats.   Decrease in sex drive.   Vaginal dryness and thinning of the vagina causing painful intercourse.   Dryness of the skin and developing wrinkles.   Headaches.   Tiredness.   Irritability.   Memory problems.   Weight gain.   Bladder infections.   Hair growth of the face and chest.   Infertility.  More serious symptoms include:  Loss of bone (osteoporosis) causing breaks (fractures).   Depression.   Hardening and narrowing of the arteries (atherosclerosis) causing heart attacks and strokes.  DIAGNOSIS   When the menstrual periods have stopped for 12 straight months.   Physical exam.   Hormone studies of the blood.  TREATMENT  There are many treatment choices and nearly  as many questions about them. The decisions to treat or not to treat menopausal changes is an individual choice made with your caregiver. Your caregiver can discuss the treatments with you. Together, you can decide which treatment will work best for you. Your treatment choices may include:   Hormone therapy (estorgen and progesterone).   Non-hormonal medications.   Treating the individual symptoms with medication (for example antidepressants for depression).   Herbal medications that may help specific symptoms.   Counseling by a psychiatrist or psychologist.   Group therapy.   Lifestyle changes including:   Eating healthy.   Regular exercise.   Limiting caffeine and alcohol.   Stress management and meditation.   No treatment.  HOME CARE INSTRUCTIONS   Take the medication your caregiver gives you as directed.   Get plenty of sleep and rest.   Exercise regularly.   Eat a diet that contains calcium (good for the bones) and soy products (acts like estrogen hormone).   Avoid alcoholic beverages.   Do not smoke.   If you have hot flashes, dress in layers.   Take supplements, calcium and vitamin D to strengthen bones.   You can use over-the-counter lubricants or moisturizers for vaginal dryness.   Group therapy is sometimes very helpful.   Acupuncture may be helpful in some cases.  SEEK MEDICAL CARE IF:   You are not sure you are in menopause.   You are having menopausal symptoms and need advice and treatment.   You are still having menstrual periods after age 55.     You have pain with intercourse.   Menopause is complete (no menstrual period for 12 months) and you develop vaginal bleeding.   You need a referral to a specialist (gynecologist, psychiatrist or psychologist) for treatment.  SEEK IMMEDIATE MEDICAL CARE IF:   You have severe depression.   You have excessive vaginal bleeding.   You fell and think you have a broken bone.   You have pain when you  urinate.   You develop leg or chest pain.   You have a fast pounding heart beat (palpitations).   You have severe headaches.   You develop vision problems.   You feel a lump in your breast.   You have abdominal pain or severe indigestion.  Document Released: 09/21/2003 Document Revised: 06/20/2011 Document Reviewed: 04/28/2008 [employer] Patient Information 2012 [employer], [place]  To help reduce symptoms: FLAX SEED OIL CAPSULES  1000- 1200 mg   Take 1 capsule daily and you should notice a difference in 6-8 weeks. Check at [employer] for HERBAL COMBINATIONS that are good for Menopause-   Black cohosh with Chaste berry and Primrose   .Keeping you healthy  Get these tests  Blood pressure- Have your blood pressure checked once a year by your healthcare provider.  Normal blood pressure is 120/80  Weight- Have your body mass index (BMI) calculated to screen for obesity.  BMI is a measure of body fat based on height and weight. You can also calculate your own BMI at [email or web address].  Cholesterol- Have your cholesterol checked every year.  Diabetes- Have your blood sugar checked regularly if you have high blood pressure, high cholesterol, have a family history of diabetes or if you are overweight.  Screening for Colon Cancer- Colonoscopy starting at age 42.  Screening may begin sooner depending on your family history and other health conditions. Follow up colonoscopy as directed by your Gastroenterologist.  Screening for Prostate Cancer- Both blood work (PSA) and a rectal exam help screen for Prostate Cancer.  Screening begins at age 69 with African-American men and at age 65 with Caucasian men.  Screening may begin sooner depending on your family history.  Take these medicines  Aspirin- One aspirin daily can help prevent Heart disease and Stroke.  Flu shot- Every fall.  Tetanus- Every 10 years.  Zostavax- Once after the age of 14 to prevent Shingles.  Pneumonia  shot- Once after the age of 46; if you are younger than 27, ask your healthcare provider if you need a Pneumonia shot.  Take these steps  Don't smoke- If you do smoke, talk to your doctor about quitting.  For tips on how to quit, go to www.smokefree.gov or call 1-800-QUIT-NOW.  Be physically active- Exercise 5 days a week for at least 30 minutes.  If you are not already physically active start slow and gradually work up to 30 minutes of moderate physical activity.  Examples of moderate activity include walking briskly, mowing the yard, dancing, swimming, bicycling, etc.  Eat a healthy diet- Eat a variety of healthy food such as fruits, vegetables, low fat milk, low fat cheese, yogurt, lean meant, poultry, fish, beans, tofu, etc. For more information go to www.thenutritionsource.org  Drink alcohol in moderation- Limit alcohol intake to less than two drinks a day. Never drink and drive.  Dentist- Brush and floss twice daily; visit your dentist twice a year.  Depression- Your emotional health is as important as your physical health. If you're feeling down, or losing interest in things you would normally enjoy  please talk to your healthcare provider.  Eye exam- Visit your eye doctor every year.  Safe sex- If you may be exposed to a sexually transmitted infection, use a condom.  Seat belts- Seat belts can save your life; always wear one.  Smoke/Carbon Monoxide detectors- These detectors need to be installed on the appropriate level of your home.  Replace batteries at least once a year.  Skin cancer- When out in the sun, cover up and use sunscreen 15 SPF or higher.  Violence- If anyone is threatening you, please tell your healthcare provider.  Living Will/ Health care power of attorney- Speak with your healthcare provider and family.    Diabetic Neuropathy Diabetic neuropathy is a common complication caused by diabetes. Neuropathy is a term that means nerve disease or damage. If your  diabetes is uncontrolled and you have high blood glucose (sugar) levels, over time, this can lead to damage to nerves throughout your body. There are three types of diabetic neuropathy:   Peripheral.   Autonomic.   Focal.  PERIPHERAL NEUROPATHY Peripheral neuropathy is the most common form of diabetic neuropathy. It causes damage to the nerves of the feet and legs and eventually the hands and arms.  SYMPTOMS  Peripheral neuropathy occurs slowly over time. The peripheral nerves sense touch, hot and cold, and pain. When these nerves no longer work:   Your feet become numb.   You can no longer feel pressure or pain in your feet.   You may have burning, stabbing or aching pain.  This can lead to:  Thick calluses over pressure areas.   Pressure sores.   Ulcers. Ulcers can become infected with germs (bacteria) and can even lead to infection in the bones of the feet.  DIAGNOSIS  The diagnosis of diabetic neuropathy is difficult at best. Sensory function testing can be done with:  Light touch using a monofilament.   Vibration with tuning fork.   Sharp sensation with pin prick  Other tests that can help diagnose neuropathy are:  Nerve Conduction Velocities (NCV). This checks the transmission of electrical current through a nerve.   Electromyography (EMG). This shows how muscles respond to electrical signals transmitted by nearby nerves.   Quantitative sensory testing, which is used to assess how your nerves respond to vibration and changes in temperature.  AUTONOMIC NEUROPATHY The autonomic nervous system controls functions that you do not think about. Examples would be:   Heart beat.   Regulation of body temperature.   Blood pressure.   Urination.   Digestion.   Sweating.   Sexual function.  SYMPTOMS  The symptoms of autonomic neuropathy vary depending on which nerves are affected.   There can be problems with digestion such as:   Feeling sick to your stomach  (nausea).   Vomiting.   Bloating.   Constipation.   Diarrhea.   Abdominal pain.   Difficulty with urination may occur because of the inability to sense when your bladder is full. You may have urine leakage (incontinence) or inability to empty your bladder completely (retention).   Palpitations or a feeling of an abnormal heart beat.   Blood pressure drops on arising (orthostatic hypotension). This can happen when you first sit up or stand up. It causes you to feel:   Dizzy.   Weak.   Faint.   Sexual functioning:   In men, inability to attain and maintain an erection.   In women, vaginal dryness and problems with decreased sexual desire and arousal.  DIAGNOSIS  Diagnosis is often based on reported symptoms. Tell your medical caregiver if you experience:   Dizziness.   Constipation.   Diarrhea.   Inappropriate urination or inability to urinate.   Inability to get or maintain an erection.  Tests that may be done include:  An EKG or Holter Monitor. These are tests that can help show problems with the heart rate or heart rhythm.   X-rays can be used to find if there are problems with your ability to properly empty food from your stomach into the small intestine after eating.  FOCAL NEUROPATHY Focal neuropathy affects just one nerve tract and occurs suddenly. However, it usually improves by itself over time. It does not cause long term damage, and treatments are usually needed only until the problem improves. SYMPTOMS  Examples include:   Abnormal eye movements or abnormal alignment of both eyes.   Weakness in the wrist.   Foot drop, which results in inability to lift the foot properly. This causes abnormal walking or foot movement.  DIAGNOSIS  Diagnosis is made based on your symptoms and what your caregiver finds on your exam. Other tests that may be done include:  Nerve Conduction Velocities (NCV). This checks the transmission of electrical current through a  nerve.   Electromyography (EMG). This shows how muscles respond to electrical signals transmitted by nearby nerves.   Quantitative sensory testing, which is used to assess how your nerves respond to vibration and changes in temperature.  TREATMENT Once nerve damage occurs it cannot be reversed. The goal of treatment is to keep the disease from getting worse. If it gets worse, it will affect more nerve fibers. Controlling your blood (sugar) is the key. You will need to keep your blood glucose and A1c at the target range prescribed by your caregiver. Things that will help control blood glucose levels include:  Blood glucose monitoring.   Meal planning.   Physical activity.   Diabetes medication.  Over time, maintaining lower blood glucose levels helps lessen symptoms. Sometimes, prescription pain medicine is needed. Focal neuropathy can be painful and unpredictable and occurs most often in older adults with diabetes.  SEEK MEDICAL CARE IF:   You develop peripheral nerve symptoms such as burning, numbness, or pain in your feet, legs or hands.   You develop autonomic nerve symptoms such as:   Dizziness.   Abnormal urinary control.   Inability to get an erection.   You develop focal nerve symptoms such as sudden abnormal eye movements or sudden foot drop.  Document Released: 09/09/2001 Document Revised: 06/20/2011 Document Reviewed: 12/09/2008 [facility] Patient Information 2012 [place], [place].

## 2011-11-15 LAB — MICROALBUMIN, URINE: Microalb, Ur: 1.19 mg/dL (ref 0.00–1.89)

## 2011-12-02 ENCOUNTER — Ambulatory Visit
Admission: RE | Admit: 2011-12-02 | Discharge: 2011-12-02 | Disposition: A | Discharge status: Home / Self Care | Payer: 59 | Source: Ambulatory Visit | Attending: Family Medicine | Admitting: Family Medicine

## 2011-12-02 DIAGNOSIS — Z0000 Encounter for general adult medical examination without abnormal findings: Secondary | ICD-10-CM

## 2011-12-13 ENCOUNTER — Telehealth: Payer: Self-pay

## 2011-12-13 DIAGNOSIS — H34211 Partial retinal artery occlusion, right eye: Secondary | ICD-10-CM

## 2011-12-13 NOTE — Telephone Encounter (Signed)
[facility] Dr Allena Katz would like for a nurse or a Dr to contact her concerning a mutual pt please contact her @ [phone]

## 2011-12-16 NOTE — Telephone Encounter (Signed)
Tried to call Dr Allena Katz back and she is not in the office today. Spoke w/Brandon but he did not know why Dr Allena Katz was calling. Asked for [place] to try again tomorrow when she will be back in office or he will leave message for [place] to try [place] back, as well.

## 2011-12-17 NOTE — Telephone Encounter (Signed)
Lipids were done in Feb 2013; please send those results to Dr. Allena Katz. I will order carotid dopplers as requested.

## 2011-12-17 NOTE — Telephone Encounter (Signed)
Dr Vira Browns and reported that she found a Hollenhorst plaque on pts R eye on exam and would like for [place] to order a carotid US and lipid panel for pt, and forward results to her when we receive them. I have sent the report from Dr Allena Katz to Dr Audria Nine at 104.

## 2011-12-18 NOTE — Telephone Encounter (Signed)
Faxed lipid panel results to Dr Allena Katz and wrote on fax that carotid dopplers have been ordered. I'm forwarding this back to Dr Audria Nine to document results from dopplers after back and reviewed.

## 2011-12-19 ENCOUNTER — Ambulatory Visit
Admission: RE | Admit: 2011-12-19 | Discharge: 2011-12-19 | Disposition: A | Discharge status: Home / Self Care | Payer: 59 | Source: Ambulatory Visit | Attending: Family Medicine | Admitting: Family Medicine

## 2011-12-19 DIAGNOSIS — H34211 Partial retinal artery occlusion, right eye: Secondary | ICD-10-CM

## 2011-12-20 ENCOUNTER — Ambulatory Visit (INDEPENDENT_AMBULATORY_CARE_PROVIDER_SITE_OTHER): Payer: 59 | Admitting: Family Medicine

## 2011-12-20 ENCOUNTER — Telehealth: Payer: Self-pay | Admitting: Radiology

## 2011-12-20 ENCOUNTER — Encounter: Payer: Self-pay | Admitting: Family Medicine

## 2011-12-20 VITALS — BP 139/80 | HR 71 | Temp 98.0°F | Resp 20 | Ht 66.5 in | Wt 261.0 lb

## 2011-12-20 DIAGNOSIS — E042 Nontoxic multinodular goiter: Secondary | ICD-10-CM

## 2011-12-20 NOTE — Telephone Encounter (Signed)
Message copied by Luretha Murphy on Fri Dec 20, 2011  9:17 AM ------      Message from: Maurice March      Created: Fri Dec 20, 2011  8:41 AM       Please FAX the results to Dr. Allena Katz at [facility].            Contact pt to schedule office visit to discuss findings regarding thyroid gland.

## 2011-12-20 NOTE — Progress Notes (Signed)
Quick Note:  Please FAX the results to Dr. Allena Katz at [facility].  Contact pt to schedule office visit to discuss findings regarding thyroid gland. ______

## 2011-12-20 NOTE — Telephone Encounter (Signed)
GAVE PT MESSAGE--SHE WILL CALL AND MAKE APPT. REPORT FAXED TO DR PATEL.

## 2011-12-20 NOTE — Telephone Encounter (Signed)
Please bring [id number] [phone]. She just made an appt for this afternoon  Thank you

## 2011-12-22 ENCOUNTER — Other Ambulatory Visit: Payer: Self-pay | Admitting: Internal Medicine

## 2011-12-22 ENCOUNTER — Encounter: Payer: Self-pay | Admitting: Family Medicine

## 2011-12-22 NOTE — Progress Notes (Signed)
  Subjective:    Patient ID: [name], female    DOB: 1961/06/17, 51 y.o.   MRN: [id number]  HPI   This 51 y.o. AA female returns for follow-up after carotid dopplers showed good antegrade flow;   this study was done at the request of Dr.Patel at [facility] because a Hollenhorst plaque  was seen on retinal exam. Incidentally, thyroid nodules were seen and pt is back to review results and  discuss next step in evaluation of thyroid.            Re: Diabetes- she is monitoring nutrition with help of spouse and walking most days of the week.  FSBS are not above 160. No hypoglycemic episodes and no missed doses of medications. She has  lost some weight.  Review of Systems  Constitutional: Negative for fever, fatigue and unexpected weight change.  HENT: Negative for sore throat, trouble swallowing, neck pain and voice change.   Eyes: Negative.   Gastrointestinal: Negative for diarrhea and constipation.  Musculoskeletal: Negative.   Skin: Negative.   Neurological: Negative.        Objective:   Physical Exam  Nursing note and vitals reviewed. Constitutional: She is oriented to person, place, and time. She appears well-developed and well-nourished. No distress.  HENT:  Head: Normocephalic and atraumatic.  Right Ear: External ear normal.  Left Ear: External ear normal.  Mouth/Throat: Oropharynx is clear and moist.  Eyes: Conjunctivae and EOM are normal. No scleral icterus.  Neck: Normal range of motion. Neck supple. No thyromegaly present.       No palpable thyroid nodules  Cardiovascular: Normal rate and regular rhythm.   Pulmonary/Chest: Effort normal. No respiratory distress.  Neurological: She is alert and oriented to person, place, and time. She has normal reflexes. No cranial nerve deficit.  Skin: Skin is warm and dry.   LAB:  08/20/2011-  TSH=2.109       Assessment & Plan:   1. Multiple thyroid nodules  US Soft Tissue Head/Neck

## 2011-12-27 ENCOUNTER — Ambulatory Visit (HOSPITAL_COMMUNITY)
Admission: RE | Admit: 2011-12-27 | Discharge: 2011-12-27 | Disposition: A | Discharge status: Home / Self Care | Payer: 59 | Source: Ambulatory Visit | Attending: Family Medicine | Admitting: Family Medicine

## 2011-12-27 DIAGNOSIS — E042 Nontoxic multinodular goiter: Secondary | ICD-10-CM

## 2011-12-27 DIAGNOSIS — E049 Nontoxic goiter, unspecified: Secondary | ICD-10-CM | POA: Insufficient documentation

## 2011-12-27 NOTE — Progress Notes (Signed)
Quick Note:  Your recent imaging study is normal. Ultrasound of thyroid shows a very small benign nodule that does not require any further evaluation. ______

## 2012-02-04 ENCOUNTER — Other Ambulatory Visit: Payer: Self-pay | Admitting: Internal Medicine

## 2012-02-28 ENCOUNTER — Other Ambulatory Visit: Payer: Self-pay | Admitting: Physician Assistant

## 2012-02-28 NOTE — Telephone Encounter (Signed)
Needs office visit.

## 2012-03-30 ENCOUNTER — Other Ambulatory Visit: Payer: Self-pay | Admitting: Physician Assistant

## 2012-03-30 ENCOUNTER — Other Ambulatory Visit: Payer: Self-pay | Admitting: Internal Medicine

## 2012-06-01 ENCOUNTER — Other Ambulatory Visit: Payer: Self-pay | Admitting: Internal Medicine

## 2012-06-13 ENCOUNTER — Ambulatory Visit (INDEPENDENT_AMBULATORY_CARE_PROVIDER_SITE_OTHER): Payer: 59 | Admitting: Physician Assistant

## 2012-06-13 VITALS — BP 156/90 | HR 76 | Temp 98.2°F | Resp 18 | Ht 66.5 in | Wt 269.0 lb

## 2012-06-13 DIAGNOSIS — Z9081 Acquired absence of spleen: Secondary | ICD-10-CM

## 2012-06-13 DIAGNOSIS — K8689 Other specified diseases of pancreas: Secondary | ICD-10-CM

## 2012-06-13 DIAGNOSIS — I10 Essential (primary) hypertension: Secondary | ICD-10-CM | POA: Insufficient documentation

## 2012-06-13 DIAGNOSIS — E119 Type 2 diabetes mellitus without complications: Secondary | ICD-10-CM

## 2012-06-13 DIAGNOSIS — K219 Gastro-esophageal reflux disease without esophagitis: Secondary | ICD-10-CM

## 2012-06-13 DIAGNOSIS — E7800 Pure hypercholesterolemia, unspecified: Secondary | ICD-10-CM

## 2012-06-13 DIAGNOSIS — E559 Vitamin D deficiency, unspecified: Secondary | ICD-10-CM

## 2012-06-13 DIAGNOSIS — Z23 Encounter for immunization: Secondary | ICD-10-CM

## 2012-06-13 DIAGNOSIS — E1169 Type 2 diabetes mellitus with other specified complication: Secondary | ICD-10-CM

## 2012-06-13 LAB — COMPREHENSIVE METABOLIC PANEL
ALT: 23 U/L (ref 0–35)
AST: 14 U/L (ref 0–37)
Albumin: 4.8 g/dL (ref 3.5–5.2)
Alkaline Phosphatase: 53 U/L (ref 39–117)
BUN: 11 mg/dL (ref 6–23)
CO2: 28 mEq/L (ref 19–32)
Calcium: 9.7 mg/dL (ref 8.4–10.5)
Chloride: 102 mEq/L (ref 96–112)
Creat: 0.68 mg/dL (ref 0.50–1.10)
Glucose, Bld: 173 mg/dL — ABNORMAL HIGH (ref 70–99)
Potassium: 4.3 mEq/L (ref 3.5–5.3)
Sodium: 137 mEq/L (ref 135–145)
Total Bilirubin: 0.5 mg/dL (ref 0.3–1.2)
Total Protein: 7.6 g/dL (ref 6.0–8.3)

## 2012-06-13 LAB — LIPID PANEL
Cholesterol: 177 mg/dL (ref 0–200)
HDL: 57 mg/dL (ref >39)
LDL Cholesterol: 106 mg/dL — ABNORMAL HIGH (ref 0–99)
Total CHOL/HDL Ratio: 3.1 Ratio
Triglycerides: 72 mg/dL (ref <150)
VLDL: 14 mg/dL (ref 0–40)

## 2012-06-13 LAB — POCT GLYCOSYLATED HEMOGLOBIN (HGB A1C): Hemoglobin A1C: 7.9

## 2012-06-13 MED ORDER — PRAVASTATIN SODIUM 40 MG PO TABS: 40.0000 mg | ORAL_TABLET | Freq: Every day | ORAL | Status: DC

## 2012-06-13 MED ORDER — METFORMIN HCL 1000 MG PO TABS: 1000.0000 mg | ORAL_TABLET | Freq: Two times a day (BID) | ORAL | Status: DC

## 2012-06-13 MED ORDER — PANCRELIPASE (LIP-PROT-AMYL) 10440-39150 UNITS PO TABS: 1.0000 | ORAL_TABLET | Freq: Three times a day (TID) | ORAL | Status: DC

## 2012-06-13 MED ORDER — PANTOPRAZOLE SODIUM 40 MG PO TBEC: 40.0000 mg | DELAYED_RELEASE_TABLET | Freq: Every day | ORAL | Status: DC

## 2012-06-13 MED ORDER — GLUCOSE BLOOD VI STRP: ORAL_STRIP | Status: DC

## 2012-06-13 MED ORDER — LISINOPRIL-HYDROCHLOROTHIAZIDE 20-12.5 MG PO TABS: 1.0000 | ORAL_TABLET | Freq: Every day | ORAL | Status: DC

## 2012-06-13 NOTE — Progress Notes (Signed)
[address] [place] [address]   Phone [phone]  Subjective:    Patient ID: [name], female    DOB: [birth year], 51 y.o.   MRN: [id number]  HPI Pt presents to clinic for multiple issues 1- HTN - she has been without her medications for the last 2 weeks.  She does not check her BP at home. 2- DM - She has been without her medications for 2 weeks. Her glucose typically runs in the low 100s when she is on medications but over the last week they have been upper 100s.  She did notice after her last appt her metformin was cut in half and she was not sure why.  No sores on her feet that she knows of.  She does have rare burning on the tops of her feet but seems to be mainly with activity.  Pt tries to walk on the track a few times a week. 3- flu vaccine - She needs her Flu vaccine due to splenectomy last year, she had her pneuomovax last year. 4- needs Vit D refilled - 5- needs cholesterol medications refilled - having no problems with it 6- GERD - has had since her GB removal last year. meds help a lot.   Review of Systems  Constitutional: Negative for fever and chills.  Respiratory: Negative for chest tightness.   Cardiovascular: Negative for chest pain, palpitations and leg swelling.  Gastrointestinal: Negative for nausea and diarrhea.  Skin: Negative for wound.       Objective:   Physical Exam  Vitals reviewed. Constitutional: She is oriented to person, place, and time. She appears well-developed and well-nourished.  HENT:  Head: Normocephalic and atraumatic.  Right Ear: External ear normal.  Left Ear: External ear normal.  Neck: Neck supple. No thyromegaly present.  Cardiovascular: Normal rate, regular rhythm and normal heart sounds.   No murmur heard. Pulmonary/Chest: Effort normal and breath sounds normal.  Neurological: She is alert and oriented to person, place, and time.  Skin: Skin is warm and dry.       No feet lesions or sores.  Skin intact. Good sensation  B feet with monofilament testing.  Psychiatric: She has a normal mood and affect. Her behavior is normal. Judgment and thought content normal.    Results for orders placed in visit on 06/13/12  POCT GLYCOSYLATED HEMOGLOBIN (HGB A1C)      Component Value Range   Hemoglobin A1C 7.9           Assessment & Plan:   1. HTN (hypertension)  Comprehensive metabolic panel, Lipid panel, lisinopril-hydrochlorothiazide (PRINZIDE,ZESTORETIC) 20-12.5 MG per tablet  2. DM type 2 (diabetes mellitus, type 2)  Lipid panel, POCT glycosylated hemoglobin (Hb A1C), metFORMIN (GLUCOPHAGE) 1000 MG tablet, glucose blood (ONE TOUCH ULTRA TEST) test strip  3. H/O splenectomy  Flu vaccine greater than or equal to 3yo preservative free IM  4. Vitamin D deficiency  Vitamin D, 25-hydroxy  5. Pancreatic insufficiency  Pancrelipase, Lip-Prot-Amyl, 10440 UNITS TABS  6. Diabetes mellitus associated with pancreatic disease  glucose blood (ONE TOUCH ULTRA TEST) test strip  7. GERD (gastroesophageal reflux disease)  pantoprazole (PROTONIX) 40 MG tablet   1-D/w pt the importance of being seen while on medications.  She will check BP in a week and make sure it is <140/90 or she will RTC. 2-  Continue daily glucose checks.  Will increase her metformin to original dose.  Pt to keep exercises and making healthy food choices for weight  reduction and better glucose control.  D/w pt her early peripheral neuropathy. 3- gave flu vaccine 4- will check levels and then determine daily dosing - recommended daily calcium and vit D supplement 5- refilled meds 6- 7- refilled medications  Recheck in 3 months - d/w pt the importance of regular medical care with her medical conditions.

## 2012-06-13 NOTE — Addendum Note (Signed)
Addended by: Morrell Riddle on: 06/13/2012 06:35 PM   Modules accepted: Orders

## 2012-06-15 ENCOUNTER — Other Ambulatory Visit: Payer: Self-pay | Admitting: *Deleted

## 2012-06-15 LAB — VITAMIN D 25 HYDROXY (VIT D DEFICIENCY, FRACTURES): Vit D, 25-Hydroxy: 25 ng/mL — ABNORMAL LOW (ref 30–89)

## 2012-06-15 MED ORDER — CHOLECALCIFEROL 100 MCG (4000 UT) PO CAPS: 1.0000 | ORAL_CAPSULE | Freq: Every day | ORAL | Status: DC

## 2012-06-15 MED ORDER — PANCRELIPASE (LIP-PROT-AMYL) 5000 UNITS PO CPEP: 1.0000 | ORAL_CAPSULE | Freq: Three times a day (TID) | ORAL | Status: DC

## 2012-06-15 MED ORDER — PRAVASTATIN SODIUM 40 MG PO TABS: 40.0000 mg | ORAL_TABLET | Freq: Every day | ORAL | Status: DC

## 2012-06-15 NOTE — Addendum Note (Signed)
Addended by: Morrell Riddle on: 06/15/2012 04:57 PM   Modules accepted: Orders

## 2012-06-25 IMAGING — US US CAROTID DUPLEX BILAT
1 series · 13 of 24 positions shown · non-contrast
Comparison: None.

CLINICAL DATA: Abnormal orbital exam.  Plaque.

BILATERAL CAROTID DUPLEX ULTRASOUND
TECHNIQUE: Gray scale imaging, color Doppler and duplex ultrasound
was performed of bilateral carotid and vertebral arteries in the
neck.

[Series 1: us carotid duplex bilat · 0.08mm/px · 13 of 62 slices shown]
[im 1/62]
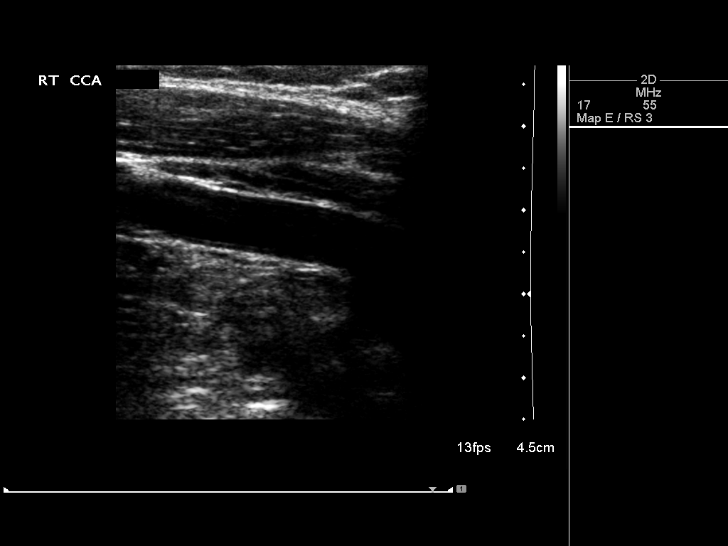
[im 6/62]
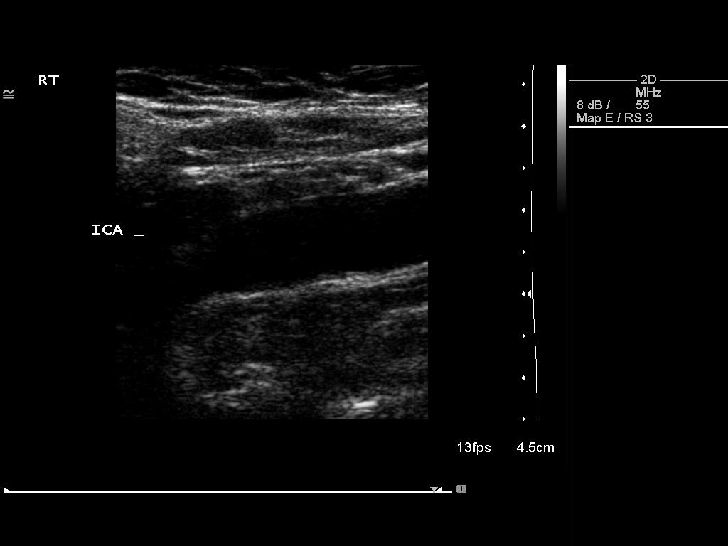
[im 11/62]
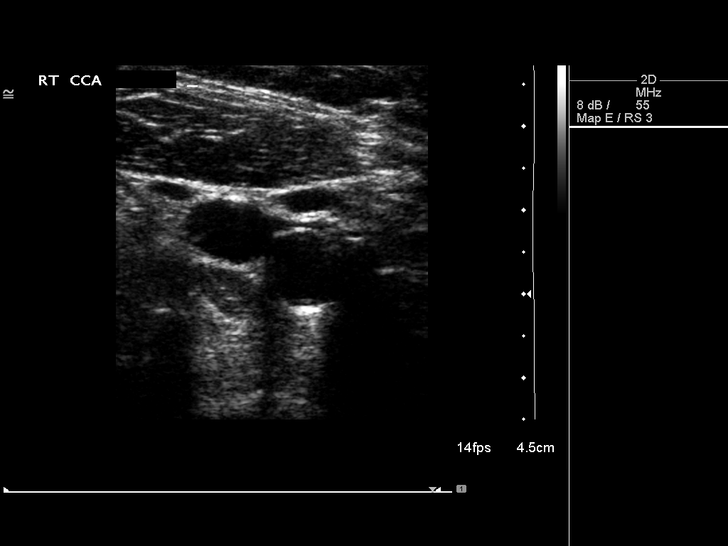
[im 16/62]
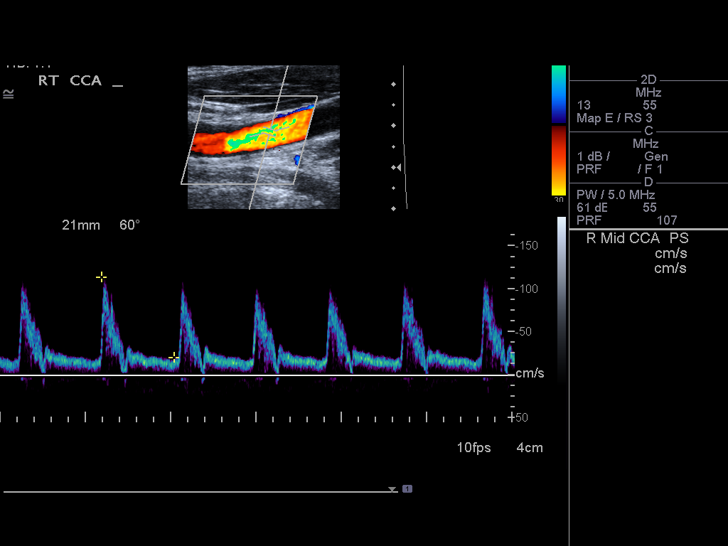
[im 22/62]
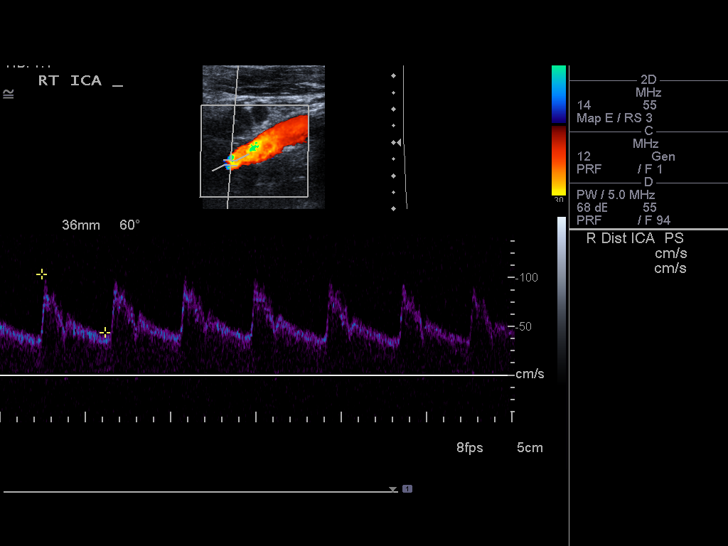
[im 27/62]
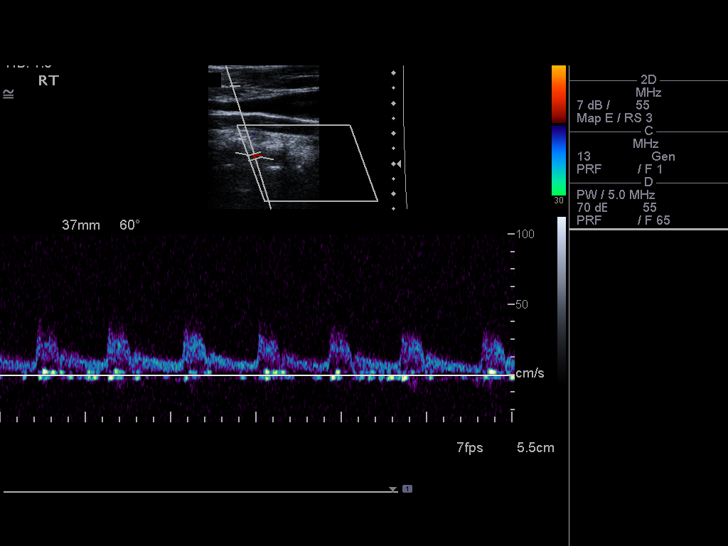
[im 32/62]
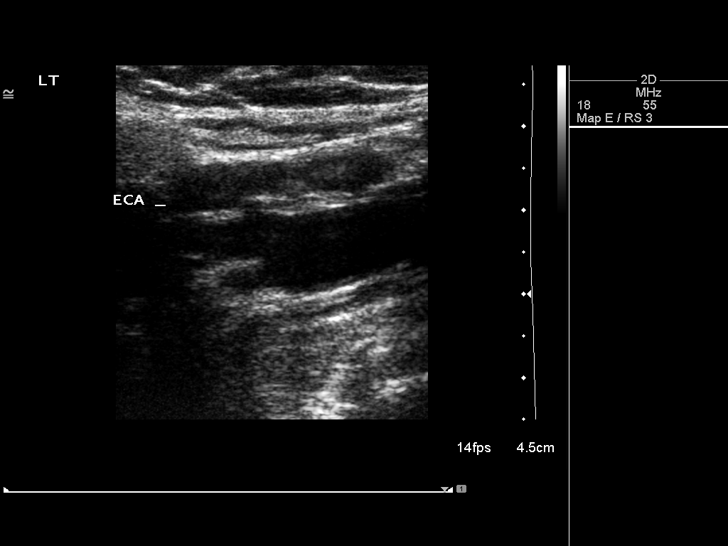
[im 35/62]
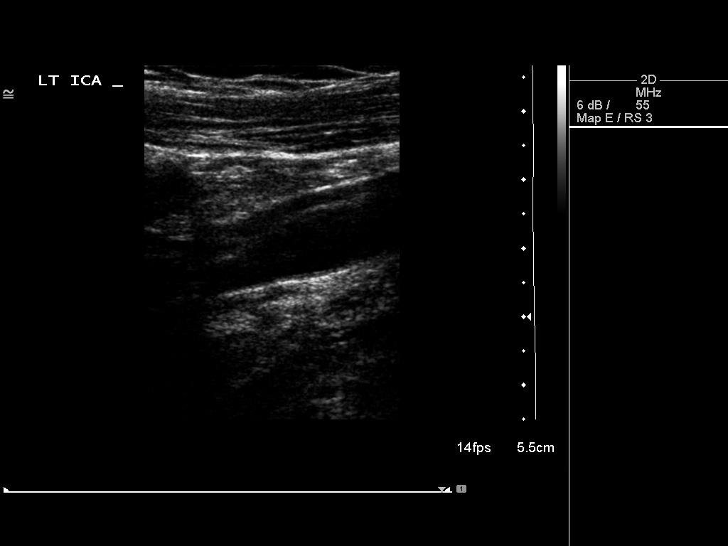
[im 40/62]
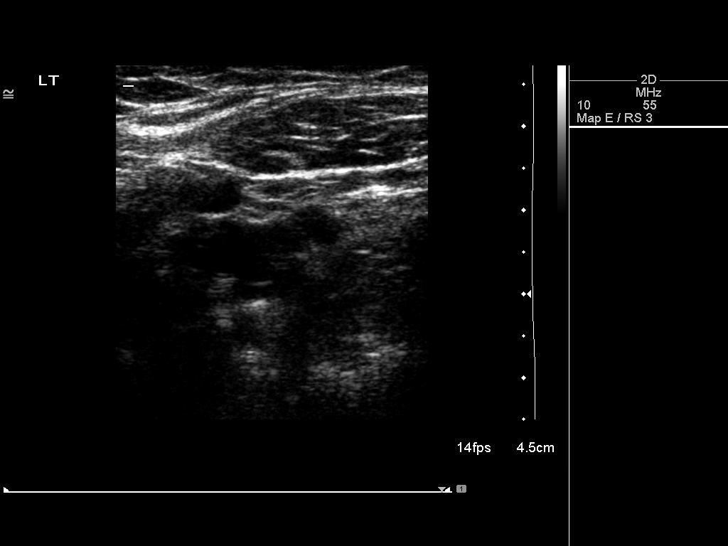
[im 46/62]
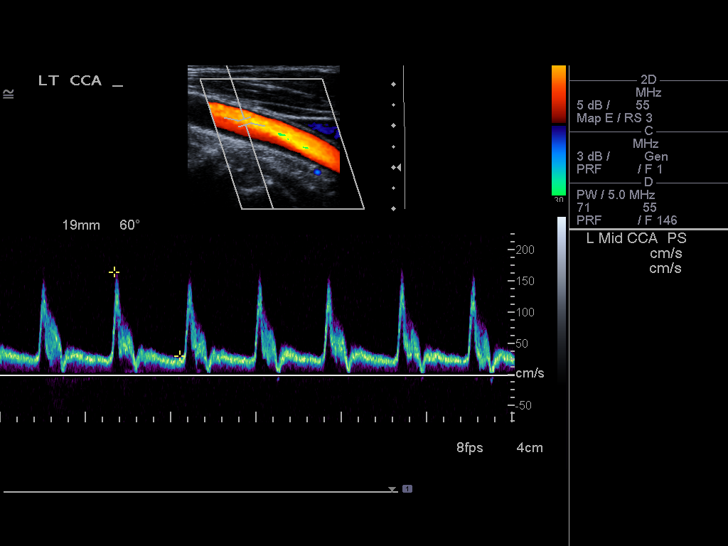
[im 51/62]
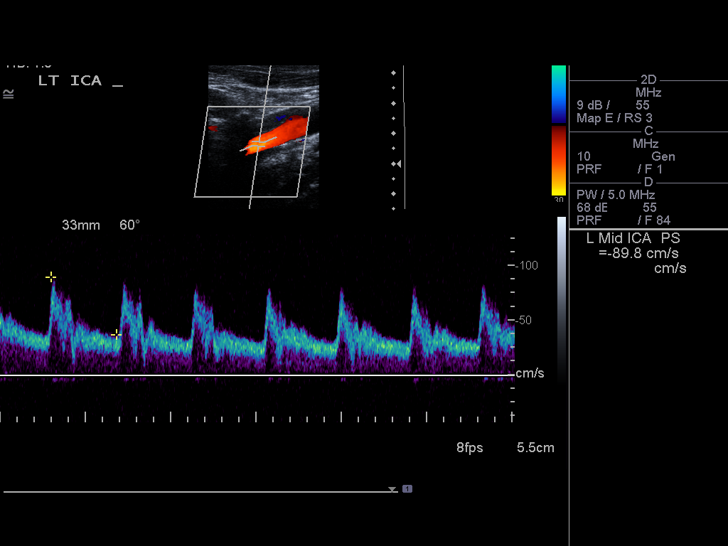
[im 56/62]
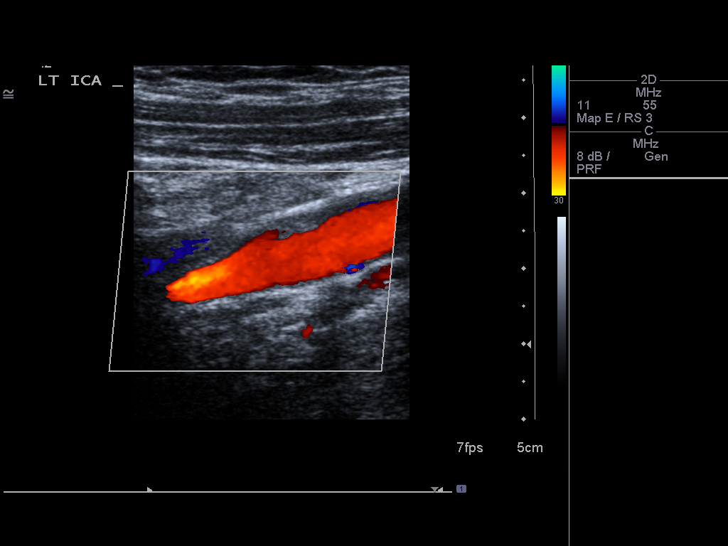
[im 62/62]
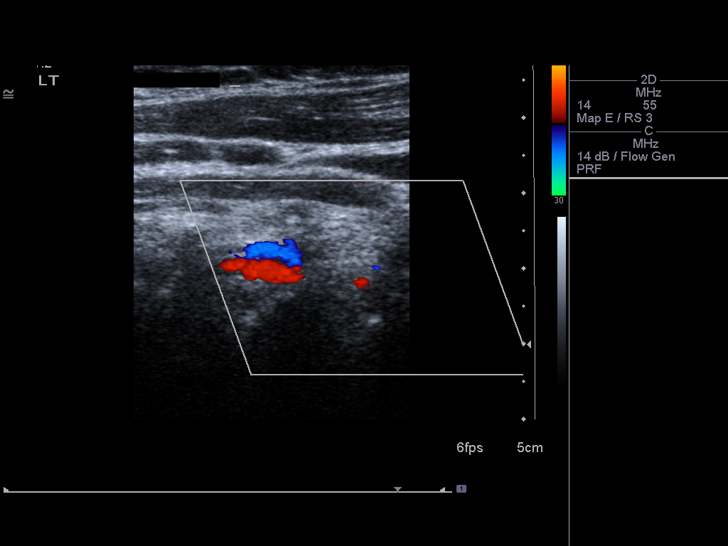

[13 of 24 positions shown; findings below may reference images not displayed]

Criteria:  Quantification of carotid stenosis is based on velocity
parameters that correlate the residual internal carotid diameter
with NASCET-based stenosis levels, using the diameter of the distal
internal carotid lumen as the denominator for stenosis measurement.

The following velocity measurements were obtained:

                 PEAK SYSTOLIC/END DIASTOLIC
RIGHT
ICA:                        109/50cm/sec
CCA:                        136/24cm/sec
SYSTOLIC ICA/CCA RATIO:
DIASTOLIC ICA/CCA RATIO:
ECA:                        68cm/sec

LEFT
ICA:                        90/37cm/sec
CCA:                        165/31cm/sec
SYSTOLIC ICA/CCA RATIO:
DIASTOLIC ICA/CCA RATIO:
ECA:                        150cm/sec
FINDINGS: RIGHT CAROTID ARTERY: Mild noncalcified plaque in the carotid bulb
and proximal ICA.  No significant stenosis, less than 50%.

RIGHT VERTEBRAL ARTERY:  Antegrade flow.

LEFT CAROTID ARTERY: Mild noncalcified plaque formation in the
carotid bulb and proximal ICA.  No significant stenosis, less than
percent.

LEFT VERTEBRAL ARTERY:  Antegrade flow.

There are a few scattered hypoechoic thyroid nodules incidentally
noted within the left thyroid lobe, the largest measuring 6 mm.
IMPRESSION: Mild bilateral plaque formation without hemodynamically significant
stenosis.

Incidentally noted are small hypoechoic left thyroid lobe nodules,
the largest measuring up to 6 mm.  Complete evaluation of the
thyroid with thyroid ultrasound would be recommended.

## 2012-07-03 IMAGING — US US SOFT TISSUE HEAD/NECK
1 series · 14 of 25 positions shown · non-contrast
Comparison: Carotid ultrasound 12/19/2011

CLINICAL DATA: Thyroid nodules on prior exam

THYROID ULTRASOUND
TECHNIQUE: Ultrasound examination of the thyroid gland and adjacent
soft tissues was performed.

[Series 1: us soft tissue head/neck · 0.07mm/px · 14 of 40 slices shown]
[im 1/40]
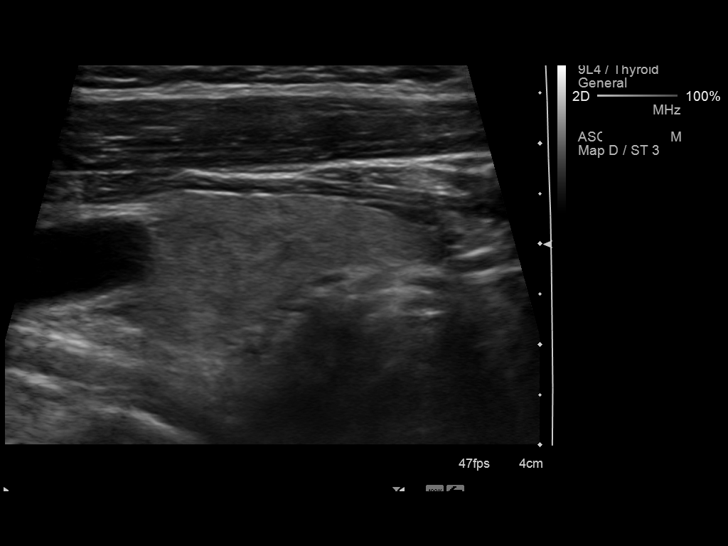
[im 4/40]
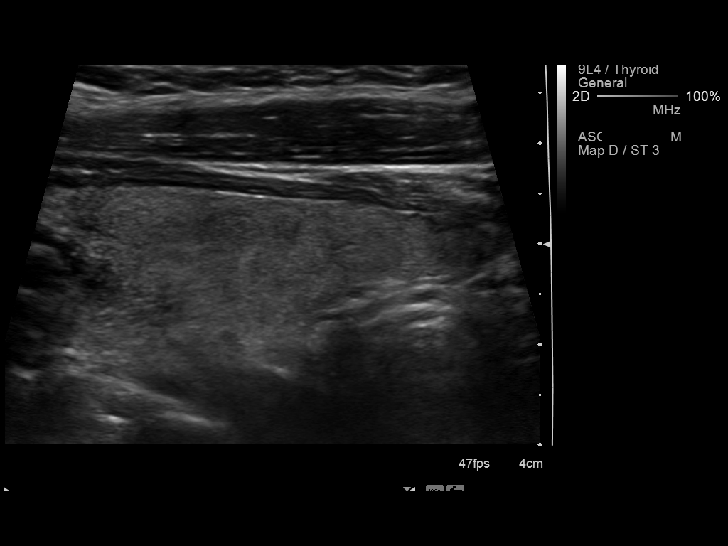
[im 7/40]
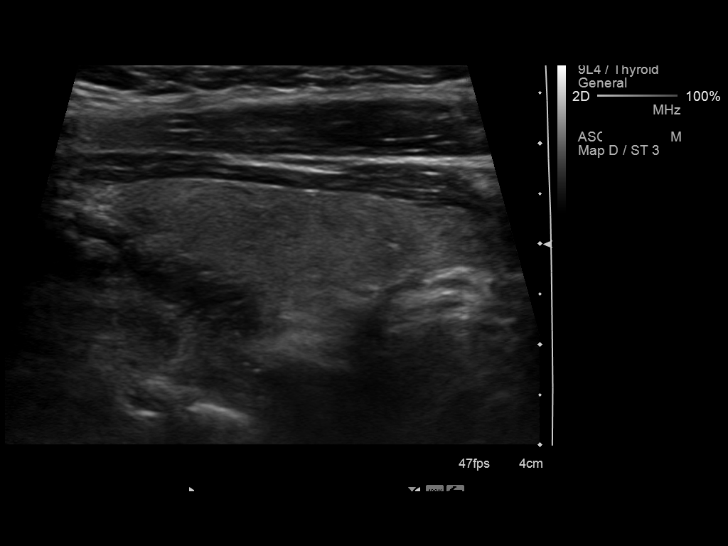
[im 10/40]
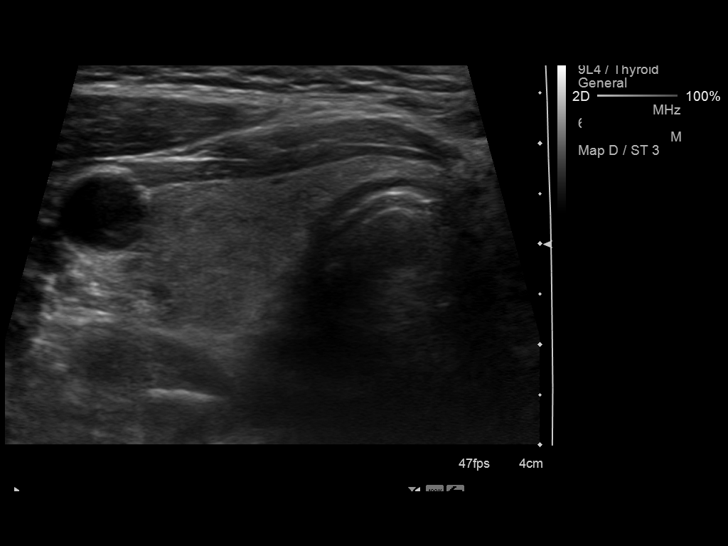
[im 14/40]
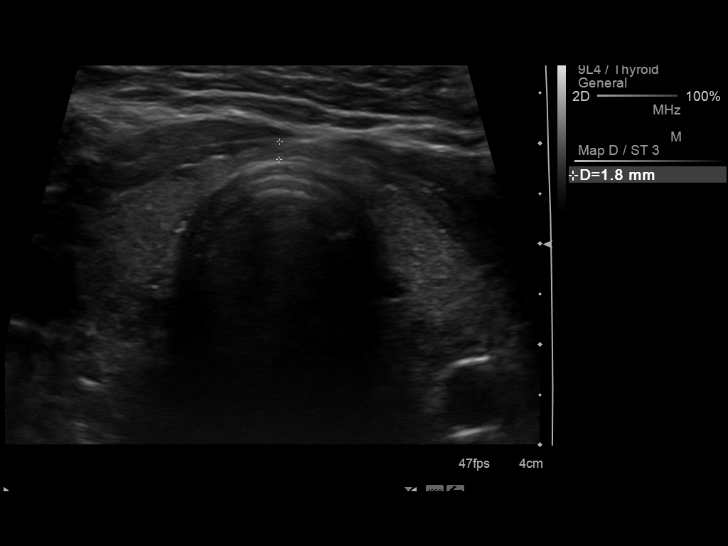
[im 15/40]
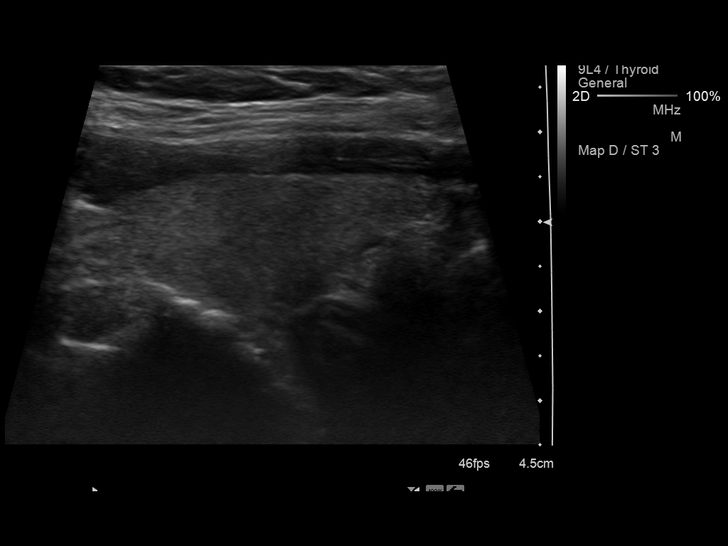
[im 18/40]
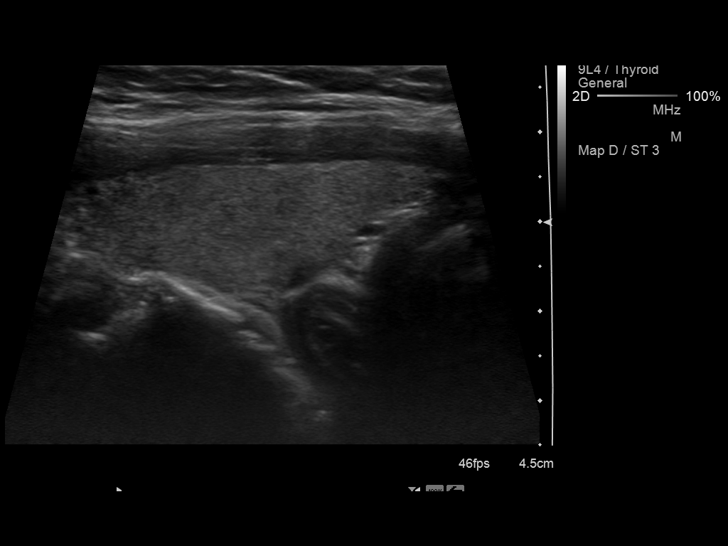
[im 22/40]
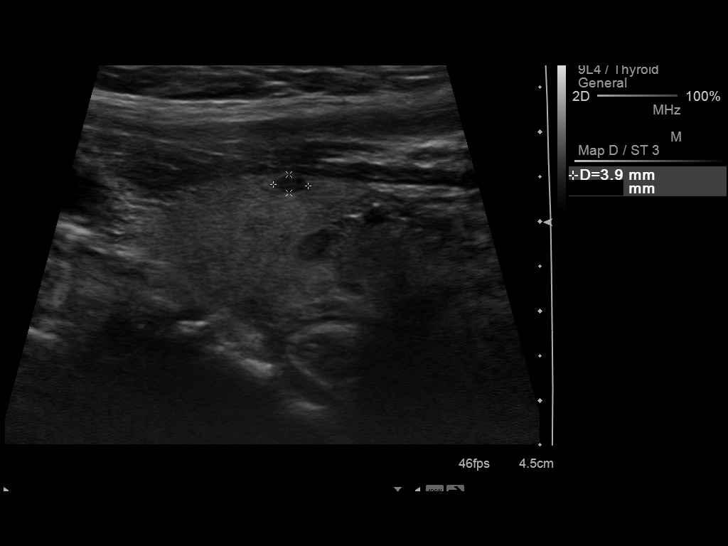
[im 25/40]
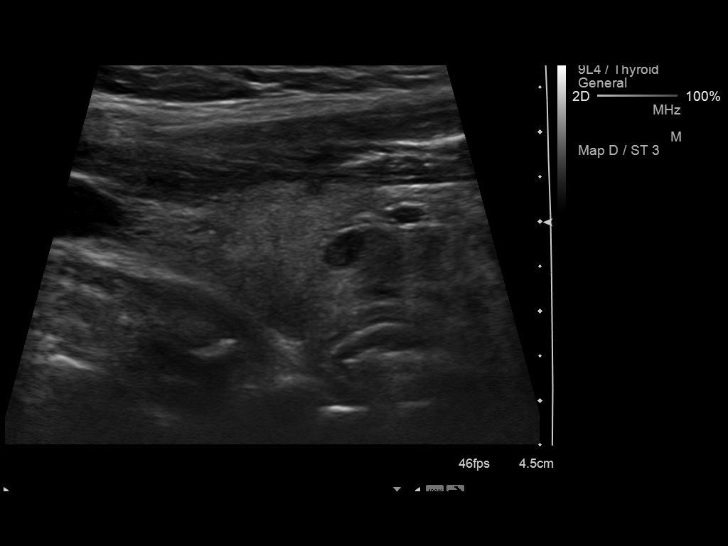
[im 27/40]
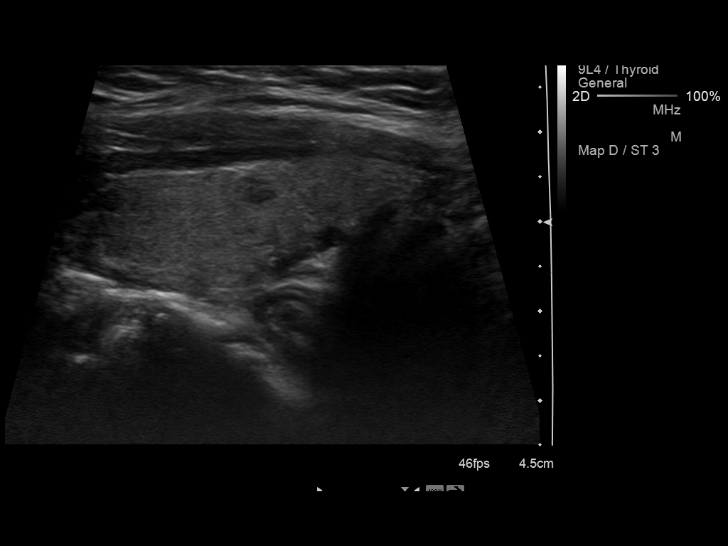
[im 30/40]
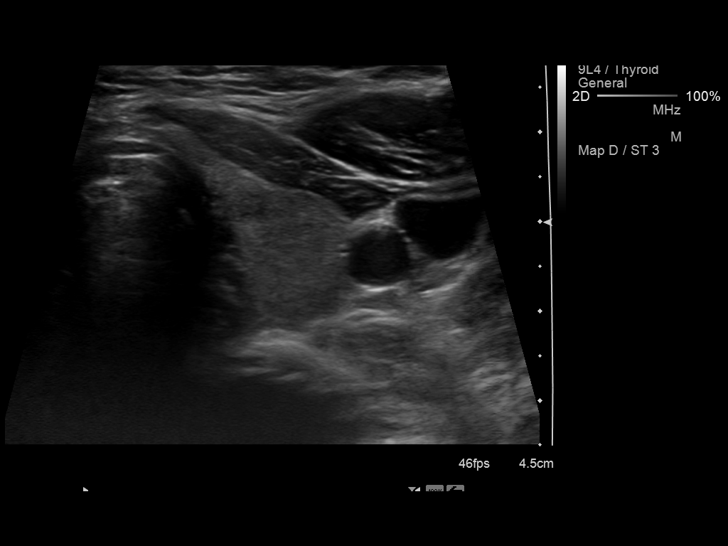
[im 33/40]
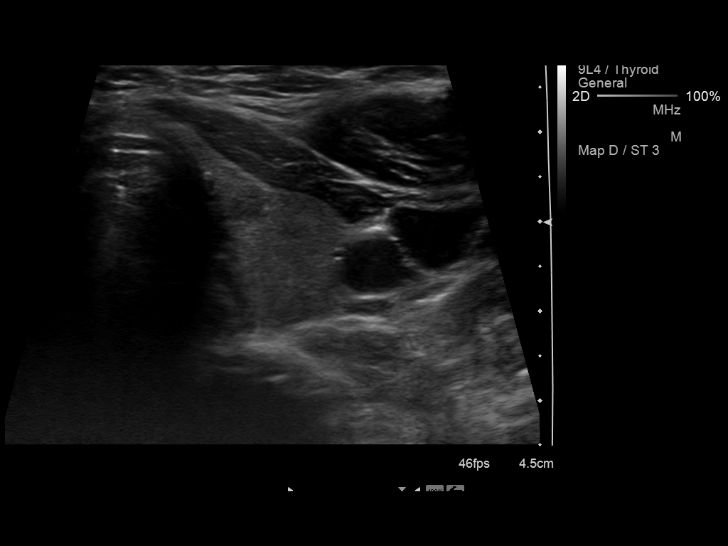
[im 36/40]
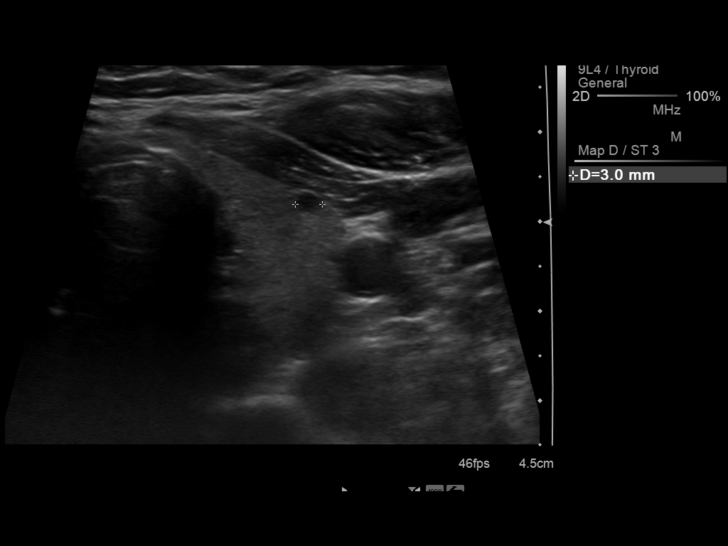
[im 40/40]
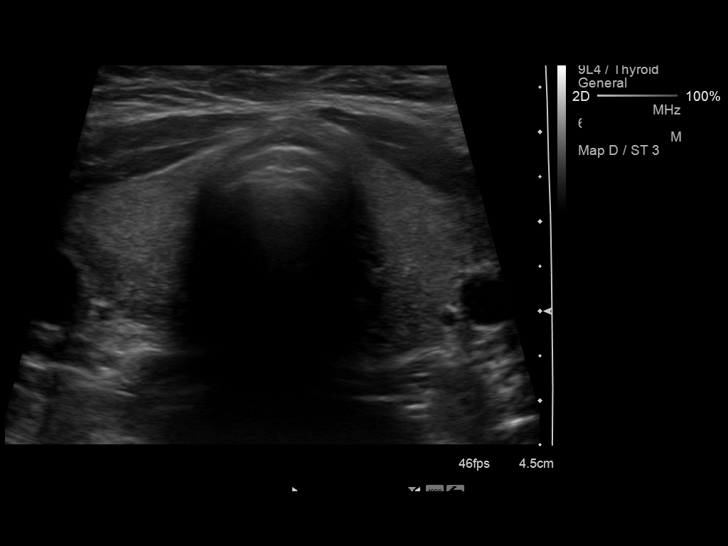

[14 of 25 positions shown; findings below may reference images not displayed]

FINDINGS: Right thyroid lobe:  3.9 x 1.6 x 1.5 cm
Left thyroid lobe:  3.9 x 1.5 x 1.2 cm
Isthmus:  0.2 cm

Focal nodules:  Hypoechoic thyroid nodules in the left mid and
inferior thyroid lobe measure less than or equal to 5 mm in maximal
diameter.  These correspond to those seen at the recent prior exam.

Lymphadenopathy:  None visualized.
IMPRESSION: Left mid/inferior thyroid lobe nodule, measuring equal to or less
than 5 mm in maximal diameter.  No specific further imaging follow-
up is needed for this probably benign finding in the absence of
specific risk factors for thyroid cancer.

## 2012-09-19 ENCOUNTER — Other Ambulatory Visit: Payer: Self-pay | Admitting: Physician Assistant

## 2012-09-24 ENCOUNTER — Telehealth: Payer: Self-pay

## 2012-09-24 NOTE — Telephone Encounter (Signed)
Pt would like referral to a nutritionist [phone]

## 2012-09-24 NOTE — Telephone Encounter (Signed)
Can we refer? 

## 2012-11-20 ENCOUNTER — Ambulatory Visit (INDEPENDENT_AMBULATORY_CARE_PROVIDER_SITE_OTHER): Payer: 59 | Admitting: Physician Assistant

## 2012-11-20 VITALS — BP 145/83 | HR 77 | Temp 98.4°F | Resp 17 | Ht 67.0 in | Wt 273.0 lb

## 2012-11-20 DIAGNOSIS — E119 Type 2 diabetes mellitus without complications: Secondary | ICD-10-CM

## 2012-11-20 DIAGNOSIS — E7800 Pure hypercholesterolemia, unspecified: Secondary | ICD-10-CM

## 2012-11-20 DIAGNOSIS — K862 Cyst of pancreas: Secondary | ICD-10-CM

## 2012-11-20 DIAGNOSIS — K219 Gastro-esophageal reflux disease without esophagitis: Secondary | ICD-10-CM

## 2012-11-20 DIAGNOSIS — M549 Dorsalgia, unspecified: Secondary | ICD-10-CM

## 2012-11-20 DIAGNOSIS — D649 Anemia, unspecified: Secondary | ICD-10-CM

## 2012-11-20 DIAGNOSIS — I10 Essential (primary) hypertension: Secondary | ICD-10-CM

## 2012-11-20 DIAGNOSIS — E109 Type 1 diabetes mellitus without complications: Secondary | ICD-10-CM

## 2012-11-20 DIAGNOSIS — E782 Mixed hyperlipidemia: Secondary | ICD-10-CM

## 2012-11-20 LAB — POCT CBC
Granulocyte percent: 48.6 %G (ref 37–80)
HCT, POC: 35.3 % — AB (ref 37.7–47.9)
Hemoglobin: 10.6 g/dL — AB (ref 12.2–16.2)
Lymph, poc: 3.9 — AB (ref 0.6–3.4)
MCH, POC: 24.9 pg — AB (ref 27–31.2)
MCHC: 30 g/dL — AB (ref 31.8–35.4)
MCV: 83.1 fL (ref 80–97)
MID (cbc): 0.6 (ref 0–0.9)
MPV: 9.4 fL (ref 0–99.8)
POC Granulocyte: 4.3 (ref 2–6.9)
POC LYMPH PERCENT: 44.5 %L (ref 10–50)
POC MID %: 6.9 %M (ref 0–12)
Platelet Count, POC: 367 10*3/uL (ref 142–424)
RBC: 4.25 M/uL (ref 4.04–5.48)
RDW, POC: 15.9 %
WBC: 8.8 10*3/uL (ref 4.6–10.2)

## 2012-11-20 LAB — LIPID PANEL
Cholesterol: 179 mg/dL (ref 0–200)
HDL: 53 mg/dL (ref >39)
LDL Cholesterol: 116 mg/dL — ABNORMAL HIGH (ref 0–99)
Total CHOL/HDL Ratio: 3.4 Ratio
Triglycerides: 50 mg/dL (ref <150)
VLDL: 10 mg/dL (ref 0–40)

## 2012-11-20 LAB — COMPREHENSIVE METABOLIC PANEL
ALT: 30 U/L (ref 0–35)
AST: 15 U/L (ref 0–37)
Albumin: 4.5 g/dL (ref 3.5–5.2)
Alkaline Phosphatase: 55 U/L (ref 39–117)
BUN: 11 mg/dL (ref 6–23)
CO2: 25 mEq/L (ref 19–32)
Calcium: 9.6 mg/dL (ref 8.4–10.5)
Chloride: 105 mEq/L (ref 96–112)
Creat: 0.7 mg/dL (ref 0.50–1.10)
Glucose, Bld: 167 mg/dL — ABNORMAL HIGH (ref 70–99)
Potassium: 4 mEq/L (ref 3.5–5.3)
Sodium: 139 mEq/L (ref 135–145)
Total Bilirubin: 0.4 mg/dL (ref 0.3–1.2)
Total Protein: 7.2 g/dL (ref 6.0–8.3)

## 2012-11-20 LAB — IRON AND TIBC
%SAT: 22 % (ref 20–55)
Iron: 68 ug/dL (ref 42–145)
TIBC: 313 ug/dL (ref 250–470)
UIBC: 245 ug/dL (ref 125–400)

## 2012-11-20 LAB — LIPASE: Lipase: <10 U/L (ref 0–75)

## 2012-11-20 LAB — POCT GLYCOSYLATED HEMOGLOBIN (HGB A1C): Hemoglobin A1C: 8.1

## 2012-11-20 MED ORDER — PANTOPRAZOLE SODIUM 40 MG PO TBEC: 40.0000 mg | DELAYED_RELEASE_TABLET | Freq: Every day | ORAL | Status: DC

## 2012-11-20 MED ORDER — PRAVASTATIN SODIUM 80 MG PO TABS: 80.0000 mg | ORAL_TABLET | Freq: Every day | ORAL | Status: DC

## 2012-11-20 MED ORDER — METFORMIN HCL 1000 MG PO TABS: 1000.0000 mg | ORAL_TABLET | Freq: Two times a day (BID) | ORAL | Status: DC

## 2012-11-20 MED ORDER — LISINOPRIL-HYDROCHLOROTHIAZIDE 20-12.5 MG PO TABS: 1.0000 | ORAL_TABLET | Freq: Every day | ORAL | Status: DC

## 2012-11-20 MED ORDER — BLOOD PRESSURE MONITOR DEVI: 1.0000 | Freq: Every morning | Status: DC

## 2012-11-20 MED ORDER — SITAGLIPTIN PHOSPHATE 50 MG PO TABS: 50.0000 mg | ORAL_TABLET | Freq: Every day | ORAL | Status: DC

## 2012-11-20 MED ORDER — PANCRELIPASE (LIP-PROT-AMYL) 5000 UNITS PO CPEP: 1.0000 | ORAL_CAPSULE | Freq: Three times a day (TID) | ORAL | Status: DC

## 2012-11-20 NOTE — Addendum Note (Signed)
Addended by: Morrell Riddle on: 11/20/2012 02:28 PM   Modules accepted: Orders

## 2012-11-20 NOTE — Progress Notes (Signed)
[address] [place] [address]   Phone [phone]  Subjective:    Patient ID: [name], female    DOB: March 26, 1961, 52 y.o.   MRN: [id number]  HPI Pt presents to clinic for med refills.  She does not check her BP at home because she does not have a cuff but she would like one.  She is checking her glucose at home and it is running slightly high with fasting about 130s.  She has gained a little weight (12#) since the summer.  She is having similar back pain that she did with her pancreatic cysts but it is improving.  The back pain does not get worse with movement and it feels deep inside. She is having problems with hot flashes but does not want to go on hormones.  She uses a fan at night and has the air condition up high.  She would like a refer to the nutritionist again to help with her recent weight gain.    Review of Systems  Constitutional: Negative for chills, activity change and appetite change.  Respiratory: Negative for cough.   Cardiovascular: Negative for chest pain.  Gastrointestinal: Negative for abdominal pain and constipation.  Genitourinary: Negative.   Musculoskeletal: Positive for back pain.       Objective:   Physical Exam  Vitals reviewed. Constitutional: She is oriented to person, place, and time. She appears well-developed and well-nourished.  HENT:  Head: Normocephalic and atraumatic.  Right Ear: External ear normal.  Left Ear: External ear normal.  Eyes: Conjunctivae are normal.  Cardiovascular: Normal rate, regular rhythm and normal heart sounds.   Pulmonary/Chest: Effort normal and breath sounds normal.  Musculoskeletal: Normal range of motion.       Lumbar back: She exhibits normal range of motion, no tenderness, no bony tenderness, no swelling and no spasm.  Neurological: She is alert and oriented to person, place, and time. She has normal strength. No sensory deficit.  Skin: Skin is warm and dry.  Psychiatric: She has a normal mood and  affect. Her behavior is normal. Judgment and thought content normal.   Results for orders placed in visit on 11/20/12  POCT CBC      Result Value Range   WBC 8.8  4.6 - 10.2 K/uL   Lymph, poc 3.9 (*) 0.6 - 3.4   POC LYMPH PERCENT 44.5  10 - 50 %L   MID (cbc) 0.6  0 - 0.9   POC MID % 6.9  0 - 12 %M   POC Granulocyte 4.3  2 - 6.9   Granulocyte percent 48.6  37 - 80 %G   RBC 4.25  4.04 - 5.48 M/uL   Hemoglobin 10.6 (*) 12.2 - 16.2 g/dL   HCT, POC 21.3 (*) 08.6 - 47.9 %   MCV 83.1  80 - 97 fL   MCH, POC 24.9 (*) 27 - 31.2 pg   MCHC 30.0 (*) 31.8 - 35.4 g/dL   RDW, POC 57.8     Platelet Count, POC 367  142 - 424 K/uL   MPV 9.4  0 - 99.8 fL  POCT GLYCOSYLATED HEMOGLOBIN (HGB A1C)      Result Value Range   Hemoglobin A1C 8.1           Assessment & Plan:  Diabetes - Pt is not at goal.  We will have pt work on exercise and weight lost with health eating but add Januvia in the mean time.  Plan: POCT glycosylated  hemoglobin (Hb A1C), Comprehensive metabolic panel, Ambulatory referral to diabetic education, metFORMIN (GLUCOPHAGE) 1000 MG tablet, sitaGLIPtin (JANUVIA) 50 MG tablet  HTN (hypertension) - Pt does not have a monitor at home so we will get her one.  Her BP today is not under control so patient will monitor over the next week.  She feels like she has never gotten good control since the switch from Diovan/HCTZ 160/12.5.  We wil get her readings and then return to that med if she is not under control.  Plan: Comprehensive metabolic panel, Blood Pressure Monitor DEVI, lisinopril-hydrochlorothiazide (PRINZIDE,ZESTORETIC) 20-12.5 MG per tablet  Hypercholesterolemia - Will check labs and adjust meds as indicated from labs.  Plan: Lipid panel, Comprehensive metabolic panel  Back pain -I think probably musculosketal since it is resolving but due to her symptoms in the past will check lipase. Plan: POCT CBC, Lipase  Anemia -Pt has had in the past but has not been worked up for it.  She has  never had a colonoscopy. Plan: Iron and TIBC, Ambulatory referral to Gastroenterology  GERD (gastroesophageal reflux disease) - continue current regimen- Plan: pantoprazole (PROTONIX) 40 MG tablet  Cyst of pancreas - Plan: Pancrelipase, Lip-Prot-Amyl, 5000 UNITS CPEP  Benny Lennert PA-C 11/20/2012 1:47 PM

## 2012-12-22 ENCOUNTER — Ambulatory Visit: Payer: 59 | Admitting: *Deleted

## 2013-01-08 ENCOUNTER — Ambulatory Visit (INDEPENDENT_AMBULATORY_CARE_PROVIDER_SITE_OTHER): Payer: 59 | Admitting: Emergency Medicine

## 2013-01-08 VITALS — BP 170/98 | HR 102 | Temp 98.5°F | Resp 16 | Ht 65.5 in | Wt 267.0 lb

## 2013-01-08 DIAGNOSIS — J018 Other acute sinusitis: Secondary | ICD-10-CM

## 2013-01-08 DIAGNOSIS — J209 Acute bronchitis, unspecified: Secondary | ICD-10-CM

## 2013-01-08 MED ORDER — PSEUDOEPHEDRINE-GUAIFENESIN ER 60-600 MG PO TB12: 1.0000 | ORAL_TABLET | Freq: Two times a day (BID) | ORAL | Status: AC

## 2013-01-08 MED ORDER — HYDROCOD POLST-CHLORPHEN POLST 10-8 MG/5ML PO LQCR
5.0000 mL | Freq: Two times a day (BID) | ORAL | Status: DC | PRN
Start: 2013-01-08 — End: 2013-02-16

## 2013-01-08 MED ORDER — AMOXICILLIN-POT CLAVULANATE 875-125 MG PO TABS: 1.0000 | ORAL_TABLET | Freq: Two times a day (BID) | ORAL | Status: DC

## 2013-01-08 NOTE — Patient Instructions (Addendum)
Bronchitis Bronchitis is the body's way of reacting to injury and/or infection (inflammation) of the bronchi. Bronchi are the air tubes that extend from the windpipe into the lungs. If the inflammation becomes severe, it may cause shortness of breath. CAUSES  Inflammation may be caused by:  A virus.  Germs (bacteria).  Dust.  Allergens.  Pollutants and many other irritants. The cells lining the bronchial tree are covered with tiny hairs (cilia). These constantly beat upward, away from the lungs, toward the mouth. This keeps the lungs free of pollutants. When these cells become too irritated and are unable to do their job, mucus begins to develop. This causes the characteristic cough of bronchitis. The cough clears the lungs when the cilia are unable to do their job. Without either of these protective mechanisms, the mucus would settle in the lungs. Then you would develop pneumonia. Smoking is a common cause of bronchitis and can contribute to pneumonia. Stopping this habit is the single most important thing you can do to help yourself. TREATMENT   Your caregiver may prescribe an antibiotic if the cough is caused by bacteria. Also, medicines that open up your airways make it easier to breathe. Your caregiver may also recommend or prescribe an expectorant. It will loosen the mucus to be coughed up. Only take over-the-counter or prescription medicines for pain, discomfort, or fever as directed by your caregiver.  Removing whatever causes the problem (smoking, for example) is critical to preventing the problem from getting worse.  Cough suppressants may be prescribed for relief of cough symptoms.  Inhaled medicines may be prescribed to help with symptoms now and to help prevent problems from returning.  For those with recurrent (chronic) bronchitis, there may be a need for steroid medicines. SEEK IMMEDIATE MEDICAL CARE IF:   During treatment, you develop more pus-like mucus (purulent  sputum).  You have a fever.  Your baby is older than 3 months with a rectal temperature of 102 F (38.9 C) or higher.  Your baby is 3 months old or younger with a rectal temperature of 100.4 F (38 C) or higher.  You become progressively more ill.  You have increased difficulty breathing, wheezing, or shortness of breath. It is necessary to seek immediate medical care if you are elderly or sick from any other disease. MAKE SURE YOU:   Understand these instructions.  Will watch your condition.  Will get help right away if you are not doing well or get worse. Document Released: 07/01/2005 Document Revised: 09/23/2011 Document Reviewed: 05/10/2008 Exit[employer] Patient Information 2014 [employer]. Sinusitis Sinusitis is redness, soreness, and swelling (inflammation) of the paranasal sinuses. Paranasal sinuses are air pockets within the bones of your face (beneath the eyes, the middle of the forehead, or above the eyes). In healthy paranasal sinuses, mucus is able to drain out, and air is able to circulate through them by way of your nose. However, when your paranasal sinuses are inflamed, mucus and air can become trapped. This can allow bacteria and other germs to grow and cause infection. Sinusitis can develop quickly and last only a short time (acute) or continue over a long period (chronic). Sinusitis that lasts for more than 12 weeks is considered chronic.  CAUSES  Causes of sinusitis include:  Allergies.  Structural abnormalities, such as displacement of the cartilage that separates your nostrils (deviated septum), which can decrease the air flow through your nose and sinuses and affect sinus drainage.  Functional abnormalities, such as when the small hairs (cilia)   that line your sinuses and help remove mucus do not work properly or are not present. SYMPTOMS  Symptoms of acute and chronic sinusitis are the same. The primary symptoms are pain and pressure around the affected  sinuses. Other symptoms include:  Upper toothache.  Earache.  Headache.  Bad breath.  Decreased sense of smell and taste.  A cough, which worsens when you are lying flat.  Fatigue.  Fever.  Thick drainage from your nose, which often is green and may contain pus (purulent).  Swelling and warmth over the affected sinuses. DIAGNOSIS  Your caregiver will perform a physical exam. During the exam, your caregiver may:  Look in your nose for signs of abnormal growths in your nostrils (nasal polyps).  Tap over the affected sinus to check for signs of infection.  View the inside of your sinuses (endoscopy) with a special imaging device with a light attached (endoscope), which is inserted into your sinuses. If your caregiver suspects that you have chronic sinusitis, one or more of the following tests may be recommended:  Allergy tests.  Nasal culture A sample of mucus is taken from your nose and sent to a lab and screened for bacteria.  Nasal cytology A sample of mucus is taken from your nose and examined by your caregiver to determine if your sinusitis is related to an allergy. TREATMENT  Most cases of acute sinusitis are related to a viral infection and will resolve on their own within 10 days. Sometimes medicines are prescribed to help relieve symptoms (pain medicine, decongestants, nasal steroid sprays, or saline sprays).  However, for sinusitis related to a bacterial infection, your caregiver will prescribe antibiotic medicines. These are medicines that will help kill the bacteria causing the infection.  Rarely, sinusitis is caused by a fungal infection. In theses cases, your caregiver will prescribe antifungal medicine. For some cases of chronic sinusitis, surgery is needed. Generally, these are cases in which sinusitis recurs more than 3 times per year, despite other treatments. HOME CARE INSTRUCTIONS   Drink plenty of water. Water helps thin the mucus so your sinuses can drain  more easily.  Use a humidifier.  Inhale steam 3 to 4 times a day (for example, sit in the bathroom with the shower running).  Apply a warm, moist washcloth to your face 3 to 4 times a day, or as directed by your caregiver.  Use saline nasal sprays to help moisten and clean your sinuses.  Take over-the-counter or prescription medicines for pain, discomfort, or fever only as directed by your caregiver. SEEK IMMEDIATE MEDICAL CARE IF:  You have increasing pain or severe headaches.  You have nausea, vomiting, or drowsiness.  You have swelling around your face.  You have vision problems.  You have a stiff neck.  You have difficulty breathing. MAKE SURE YOU:   Understand these instructions.  Will watch your condition.  Will get help right away if you are not doing well or get worse. Document Released: 07/01/2005 Document Revised: 09/23/2011 Document Reviewed: 07/16/2011 [employer] Patient Information 2014 [employer].  

## 2013-01-08 NOTE — Progress Notes (Signed)
[facility] [address] [place] [address] [phone]  Date:  01/08/2013   Name:  [name]   DOB:  [birth year]   MRN:  [id number]  PCP:  Arletta Bale    Chief Complaint: Shortness of Breath, Nasal Congestion and Fever   History of Present Illness:  [name] is a 52 y.o. very pleasant female patient who presents with the following:  Ill since Tuesday with nasal congestion and drainage, sore throat.  Has pain in right ear.  Mucopurulent sputum with cough.  No nausea or vomiting.  Some wheezing and exertional shortness of breath.  Had fever or 100.3 last night.  No chills.  No rash or stool change.  No improvement with over the counter medications or other home remedies. Denies other complaint or health concern today.   Patient Active Problem List   Diagnosis Date Noted  . HTN (hypertension) 06/13/2012  . Diabetes mellitus type II, uncontrolled 11/14/2011  . Diabetic peripheral neuropathy associated with type 2 diabetes mellitus 11/14/2011  . Obesity, Class III, BMI 40-49.9 (morbid obesity) 11/14/2011  . Menopause 11/14/2011  . Mass on back 10/28/2011  . Pleural effusion 03/20/2011  . Cholecystitis chronic 01/24/2011  . Serous cystadenoma of pancreas 01/24/2011    Past Medical History  Diagnosis Date  . Hypertension   . Diabetes mellitus   . Numbness of fingers   . Hyperlipidemia   . Abdominal pain     pain around incision area   . Costochondritis   . Allergy   . Cancer   . GERD (gastroesophageal reflux disease)   . Anemia     Past Surgical History  Procedure Laterality Date  . Cholecystectomy    . Splenectomy, partial  01/09/2011  . Pancreatectomy  01/09/2011  . Cesarean section    . Cystectomy    . Breast biopsy      left  . Abdominal hysterectomy      partial    History  Substance Use Topics  . Smoking status: Former Smoker    Quit date: 12/19/1981  . Smokeless tobacco: Never Used  . Alcohol Use: 0.5 oz/week   1 drink(s) per week    Family History  Problem Relation Age of Onset  . Heart disease Father     died at age 82  . Stroke Mother 88    per pt stroke from high doses of iron injections  . Cancer Mother 76    cervical  . Diabetes Mother     Allergies  Allergen Reactions  . Norvasc (Amlodipine) Nausea Only    Medication list has been reviewed and updated.  Current Outpatient Prescriptions on File Prior to Visit  Medication Sig Dispense Refill  . albuterol (PROAIR HFA) 108 (90 BASE) MCG/ACT inhaler Inhale 2 puffs into the lungs every 6 (six) hours as needed.  1 Inhaler  3  . Blood Pressure Monitor DEVI 1 each by Does not apply route every morning.  1 Device  0  . glucose blood (ONE TOUCH ULTRA TEST) test strip Use as directed  100 each  3  . ibuprofen (ADVIL,MOTRIN) 600 MG tablet Take 1 tablet (600 mg total) by mouth every 6 (six) hours as needed.  120 tablet  3  . lisinopril-hydrochlorothiazide (PRINZIDE,ZESTORETIC) 20-12.5 MG per tablet Take 1 tablet by mouth daily.  90 tablet  0  . metFORMIN (GLUCOPHAGE) 1000 MG tablet Take 1 tablet (1,000 mg total) by mouth 2 (two) times daily with a  meal. Need office visit and labs for additional refills.  180 tablet  0  . Pancrelipase, Lip-Prot-Amyl, 5000 UNITS CPEP Take 1 capsule (5,000 Units total) by mouth 3 (three) times daily before meals.  270 capsule  0  . pantoprazole (PROTONIX) 40 MG tablet Take 1 tablet (40 mg total) by mouth daily.  90 tablet  0  . pravastatin (PRAVACHOL) 80 MG tablet Take 1 tablet (80 mg total) by mouth daily.  90 tablet  0  . sitaGLIPtin (JANUVIA) 50 MG tablet Take 1 tablet (50 mg total) by mouth daily.  90 tablet  0   No current facility-administered medications on file prior to visit.    Review of Systems:  As per HPI, otherwise negative.    Physical Examination: Filed Vitals:   01/08/13 1106  BP: 170/98  Pulse: 102  Temp: 98.5 F (36.9 C)  Resp: 16   Filed Vitals:   01/08/13 1106  Height: 5' 5.5"  (1.664 m)  Weight: 267 lb (121.11 kg)   Body mass index is 43.74 kg/(m^2). Ideal Body Weight: Weight in (lb) to have BMI = 25: 152.2  GEN: WDWN, NAD, Non-toxic, A & O x 3 HEENT: Atraumatic, Normocephalic. Neck supple. No masses, No LAD. Ears and Nose: No external deformity. CV: RRR, No M/G/R. No JVD. No thrill. No extra heart sounds. PULM: CTA B, no wheezes, crackles, rhonchi. No retractions. No resp. distress. No accessory muscle use. ABD: S, NT, ND, +BS. No rebound. No HSM. EXTR: No c/c/e NEURO Normal gait.  PSYCH: Normally interactive. Conversant. Not depressed or anxious appearing.  Calm demeanor.    Assessment and Plan: Sinusitis Bronchitis augmentin mucinex tussionex   Signed,  Phillips Odor, MD

## 2013-01-17 ENCOUNTER — Telehealth: Payer: Self-pay

## 2013-01-17 NOTE — Telephone Encounter (Signed)
Pt states a recently rx'd antibiotic caused her to have a yeast infection. Requests something to help with that.  Pharmacy: [facility]  bf

## 2013-01-18 MED ORDER — FLUCONAZOLE 150 MG PO TABS: 150.0000 mg | ORAL_TABLET | Freq: Once | ORAL | Status: DC

## 2013-01-18 NOTE — Telephone Encounter (Signed)
Patient advised.

## 2013-02-16 ENCOUNTER — Ambulatory Visit (INDEPENDENT_AMBULATORY_CARE_PROVIDER_SITE_OTHER): Payer: 59 | Admitting: Physician Assistant

## 2013-02-16 VITALS — BP 128/74 | HR 73 | Temp 97.8°F | Resp 18 | Ht 66.5 in | Wt 272.0 lb

## 2013-02-16 DIAGNOSIS — R5381 Other malaise: Secondary | ICD-10-CM

## 2013-02-16 DIAGNOSIS — K862 Cyst of pancreas: Secondary | ICD-10-CM

## 2013-02-16 DIAGNOSIS — E7800 Pure hypercholesterolemia, unspecified: Secondary | ICD-10-CM

## 2013-02-16 DIAGNOSIS — R1012 Left upper quadrant pain: Secondary | ICD-10-CM

## 2013-02-16 DIAGNOSIS — I10 Essential (primary) hypertension: Secondary | ICD-10-CM

## 2013-02-16 DIAGNOSIS — R5383 Other fatigue: Secondary | ICD-10-CM

## 2013-02-16 DIAGNOSIS — K219 Gastro-esophageal reflux disease without esophagitis: Secondary | ICD-10-CM

## 2013-02-16 DIAGNOSIS — E119 Type 2 diabetes mellitus without complications: Secondary | ICD-10-CM

## 2013-02-16 DIAGNOSIS — E1169 Type 2 diabetes mellitus with other specified complication: Secondary | ICD-10-CM

## 2013-02-16 DIAGNOSIS — R109 Unspecified abdominal pain: Secondary | ICD-10-CM

## 2013-02-16 LAB — COMPREHENSIVE METABOLIC PANEL
ALT: 28 U/L (ref 0–35)
AST: 16 U/L (ref 0–37)
Albumin: 4.6 g/dL (ref 3.5–5.2)
Alkaline Phosphatase: 58 U/L (ref 39–117)
BUN: 11 mg/dL (ref 6–23)
CO2: 28 mEq/L (ref 19–32)
Calcium: 10.1 mg/dL (ref 8.4–10.5)
Chloride: 100 mEq/L (ref 96–112)
Creat: 0.56 mg/dL (ref 0.50–1.10)
Glucose, Bld: 144 mg/dL — ABNORMAL HIGH (ref 70–99)
Potassium: 4.4 mEq/L (ref 3.5–5.3)
Sodium: 136 mEq/L (ref 135–145)
Total Bilirubin: 0.5 mg/dL (ref 0.3–1.2)
Total Protein: 7.5 g/dL (ref 6.0–8.3)

## 2013-02-16 LAB — POCT URINALYSIS DIPSTICK
Bilirubin, UA: NEGATIVE
Blood, UA: NEGATIVE
Glucose, UA: NEGATIVE
Ketones, UA: NEGATIVE
Leukocytes, UA: NEGATIVE
Nitrite, UA: NEGATIVE
Protein, UA: NEGATIVE
Spec Grav, UA: 1.025
Urobilinogen, UA: 0.2
pH, UA: 5

## 2013-02-16 LAB — POCT CBC
Granulocyte percent: 49.9 %G (ref 37–80)
HCT, POC: 39.8 % (ref 37.7–47.9)
Hemoglobin: 12 g/dL — AB (ref 12.2–16.2)
Lymph, poc: 3.7 — AB (ref 0.6–3.4)
MCH, POC: 25.1 pg — AB (ref 27–31.2)
MCHC: 30.2 g/dL — AB (ref 31.8–35.4)
MCV: 83 fL (ref 80–97)
MID (cbc): 0.7 (ref 0–0.9)
MPV: 9.6 fL (ref 0–99.8)
POC Granulocyte: 4.4 (ref 2–6.9)
POC LYMPH PERCENT: 41.9 %L (ref 10–50)
POC MID %: 8.2 %M (ref 0–12)
Platelet Count, POC: 390 10*3/uL (ref 142–424)
RBC: 4.79 M/uL (ref 4.04–5.48)
RDW, POC: 15.2 %
WBC: 8.9 10*3/uL (ref 4.6–10.2)

## 2013-02-16 LAB — POCT GLYCOSYLATED HEMOGLOBIN (HGB A1C): Hemoglobin A1C: 7.6

## 2013-02-16 LAB — LIPASE: Lipase: 12 U/L (ref 0–75)

## 2013-02-16 MED ORDER — GLUCOSE BLOOD VI STRP: ORAL_STRIP | Status: DC

## 2013-02-16 MED ORDER — LISINOPRIL-HYDROCHLOROTHIAZIDE 20-12.5 MG PO TABS: 1.0000 | ORAL_TABLET | Freq: Every day | ORAL | Status: DC

## 2013-02-16 MED ORDER — IBUPROFEN 600 MG PO TABS: 600.0000 mg | ORAL_TABLET | Freq: Four times a day (QID) | ORAL | Status: DC | PRN

## 2013-02-16 MED ORDER — HYDROCODONE-ACETAMINOPHEN 5-325 MG PO TABS: 1.0000 | ORAL_TABLET | Freq: Three times a day (TID) | ORAL | Status: DC | PRN

## 2013-02-16 MED ORDER — PRAVASTATIN SODIUM 80 MG PO TABS: 80.0000 mg | ORAL_TABLET | Freq: Every day | ORAL | Status: DC

## 2013-02-16 MED ORDER — METFORMIN HCL 1000 MG PO TABS: 1000.0000 mg | ORAL_TABLET | Freq: Two times a day (BID) | ORAL | Status: DC

## 2013-02-16 MED ORDER — PANCRELIPASE (LIP-PROT-AMYL) 5000 UNITS PO CPEP: 1.0000 | ORAL_CAPSULE | Freq: Three times a day (TID) | ORAL | Status: DC

## 2013-02-16 MED ORDER — PANTOPRAZOLE SODIUM 40 MG PO TBEC: 40.0000 mg | DELAYED_RELEASE_TABLET | Freq: Every day | ORAL | Status: DC

## 2013-02-16 MED ORDER — CYCLOBENZAPRINE HCL 5 MG PO TABS: 5.0000 mg | ORAL_TABLET | Freq: Three times a day (TID) | ORAL | Status: DC | PRN

## 2013-02-16 MED ORDER — SITAGLIPTIN PHOSPHATE 50 MG PO TABS: 50.0000 mg | ORAL_TABLET | Freq: Every day | ORAL | Status: DC

## 2013-02-16 NOTE — Progress Notes (Signed)
[address] [place] [address]   Phone [phone]  Subjective:    Patient ID: [name], female    DOB: 1961-03-06, 52 y.o.   MRN: [id number]  HPI Pt presents to clinic with L sided mid back pain that radiates into his L lower rib cage.  She has the pain for 2 days and it is getting worse.  She thought it might be gas but she has had multiple BMs with no relief of the pain.  She is concerned because this is how the pain started when she developed pancreatitis from the cyst that caused her to have the tail of her pancrease removed about 2 years ago.  She has not had an injury to her back that she knows of and this does not feel like a muscle spasm to her.  She did take motrin last pm and that helped with her pain.  She is having no urinary problems and the pain does not change with urination.  The pain is a cramping pain and it does not cause her to be nauseated.  It is a constant pain that is worsened by movement and she can onky get comfortable by laying on her L side.  Moving her arms do not hurt She would also like to have her diabetes checked while she is here today.  Review of Systems  Respiratory: Negative for cough.   Gastrointestinal: Positive for abdominal pain (LUQ area). Negative for nausea, diarrhea and constipation.  Genitourinary: Positive for flank pain. Negative for dysuria, frequency and hematuria.  Musculoskeletal: Positive for back pain.       Objective:   Physical Exam  Vitals reviewed. Constitutional: She is oriented to person, place, and time. She appears well-developed and well-nourished.  HENT:  Head: Normocephalic and atraumatic.  Right Ear: External ear normal.  Left Ear: External ear normal.  Eyes: Conjunctivae are normal.  Neck: Normal range of motion.  Cardiovascular: Normal rate, regular rhythm and normal heart sounds.   No murmur heard. Pulmonary/Chest: Effort normal.  Abdominal: Soft. There is no tenderness. There is no CVA tenderness.    Musculoskeletal:       Thoracic back: She exhibits tenderness. She exhibits no bony tenderness and no spasm.       Back:  No increased pain with arm movement and strength testing.  She has pain with L sided rib palpation and just below the rib line on the L.  Neurological: She is alert and oriented to person, place, and time.  Skin: Skin is warm and dry.  Psychiatric: She has a normal mood and affect. Her behavior is normal. Judgment and thought content normal.      Results for orders placed in visit on 02/16/13  POCT CBC      Result Value Range   WBC 8.9  4.6 - 10.2 K/uL   Lymph, poc 3.7 (*) 0.6 - 3.4   POC LYMPH PERCENT 41.9  10 - 50 %L   MID (cbc) 0.7  0 - 0.9   POC MID % 8.2  0 - 12 %M   POC Granulocyte 4.4  2 - 6.9   Granulocyte percent 49.9  37 - 80 %G   RBC 4.79  4.04 - 5.48 M/uL   Hemoglobin 12.0 (*) 12.2 - 16.2 g/dL   HCT, POC 21.3  08.6 - 47.9 %   MCV 83.0  80 - 97 fL   MCH, POC 25.1 (*) 27 - 31.2 pg   MCHC 30.2 (*)  31.8 - 35.4 g/dL   RDW, POC 47.8     Platelet Count, POC 390  142 - 424 K/uL   MPV 9.6  0 - 99.8 fL  POCT URINALYSIS DIPSTICK      Result Value Range   Color, UA yellow     Clarity, UA clear     Glucose, UA neg     Bilirubin, UA neg     Ketones, UA neg     Spec Grav, UA 1.025     Blood, UA neg     pH, UA 5.0     Protein, UA neg     Urobilinogen, UA 0.2     Nitrite, UA neg     Leukocytes, UA Negative    POCT GLYCOSYLATED HEMOGLOBIN (HGB A1C)      Result Value Range   Hemoglobin A1C 7.6         Assessment & Plan:  LUQ pain - Plan: Lipase, Comprehensive metabolic panel, POCT CBC  Flank pain - Plan: POCT urinalysis dipstick, cyclobenzaprine (FLEXERIL) 5 MG tablet, HYDROcodone-acetaminophen (NORCO/VICODIN) 5-325 MG per tablet  Diabetes mellitus, type 2 - Plan: POCT glycosylated hemoglobin (Hb A1C)  Diabetes mellitus associated with pancreatic disease - Plan: glucose blood (ONE TOUCH ULTRA TEST) test strip  DM type 2 (diabetes mellitus,  type 2) - Plan: glucose blood (ONE TOUCH ULTRA TEST) test strip  Fatigue/genarlized pain - Plan: ibuprofen (ADVIL,MOTRIN) 600 MG tablet  HTN (hypertension) - Plan: lisinopril-hydrochlorothiazide (PRINZIDE,ZESTORETIC) 20-12.5 MG per tablet  Diabetes - Plan: metFORMIN (GLUCOPHAGE) 1000 MG tablet, sitaGLIPtin (JANUVIA) 50 MG tablet  Cyst of pancreas - Plan: Pancrelipase, Lip-Prot-Amyl, 5000 UNITS CPEP  GERD (gastroesophageal reflux disease) - Plan: pantoprazole (PROTONIX) 40 MG tablet  Hypercholesterolemia - Plan: pravastatin (PRAVACHOL) 80 MG tablet  Refilled all of patient medications for 3 months.  Her DM is more controlled on the addition of Januvia - over the next 3 months we will continue to monitor and have patient continue lifetstle modifications and if no better control will increase the dose.  She will continue to monitor her glucose.  I am unsure of the exact etiology of her pain.  She has no blood in her urine which decrease chances of kidney stones.  With her history of pancreatitis we will order labs and wait but she will do a clear liquid diet. We will treat her pain with Motrin and then Norco for additional pain. At the same time we will start musculoskeletal treatment to see if she gets pain relief.  Answered her questions and he voiced understanding and agreement.  Benny Lennert PA-C 02/16/2013 9:52 AM

## 2013-02-18 ENCOUNTER — Telehealth: Payer: Self-pay

## 2013-02-18 NOTE — Telephone Encounter (Signed)
Due to worse with ambulation makes me think this is musculoskeletal and she should continue the NSAIDs and RTC 1 week if no better.

## 2013-02-18 NOTE — Telephone Encounter (Signed)
Called her. She is still having flank pain on the left, worse with ambulation, wants to know if anything else she can do for this. She states it is same not worse, advised if it gets worse to return. She was advised of lab results. Please advise if anything else she can do for this.

## 2013-02-18 NOTE — Telephone Encounter (Signed)
Patient is requesting her test results.     Also, wants sarah weber to call regarding continued pain in her back.   [phone]

## 2013-02-19 NOTE — Telephone Encounter (Signed)
Patient advised.

## 2013-05-13 ENCOUNTER — Telehealth: Payer: Self-pay

## 2013-05-13 ENCOUNTER — Telehealth: Payer: Self-pay | Admitting: Physician Assistant

## 2013-05-13 DIAGNOSIS — E119 Type 2 diabetes mellitus without complications: Secondary | ICD-10-CM

## 2013-05-13 NOTE — Telephone Encounter (Signed)
Must be the Januvia. I have a coupon, if she wants to try. Called, left message for her to call me back.

## 2013-05-13 NOTE — Telephone Encounter (Signed)
Please call patient -I have received something from [facility] that states she would like to change her Januvia due to cost - please see if that is correct and let me know.

## 2013-05-13 NOTE — Telephone Encounter (Signed)
Called her she states coupon was not helpful this time, medication still $245. Please advise if we can change her Januvia to something more cost effective.

## 2013-05-13 NOTE — Telephone Encounter (Signed)
Will forward to Maralyn Sago to advise on another antidiabetic agent

## 2013-05-13 NOTE — Telephone Encounter (Signed)
Yes, there is another message on this was sent to you, I spoke to her x2 this am . She indicates first month with coupon was $5 then next month $40, this month was $245. Needs an alternative. Please advsise.

## 2013-05-13 NOTE — Telephone Encounter (Signed)
She has coupon, worked great for 2 months now not so much. She will call pharmacy and make sure the coupon was applied, the coupon should be good for 12 months. She will call back if she can not get this covered with the coupon, and I will see if it needs to be changed to a more cost effective medicaiton

## 2013-05-13 NOTE — Telephone Encounter (Signed)
Patient would like to talk to amy about her junuvia if possible please call her at [phone]

## 2013-05-13 NOTE — Telephone Encounter (Addendum)
PT STATES THE MEDICINE SHE WAS GIVEN COST OVER $300.00 AND WOULD LIKE TO HAVE SOMETHING ELSE CALLED IN. PLEASE CALL [phone]     [facility] [address]

## 2013-05-17 MED ORDER — SAXAGLIPTIN HCL 2.5 MG PO TABS: 2.5000 mg | ORAL_TABLET | Freq: Every day | ORAL | Status: DC

## 2013-05-17 NOTE — Telephone Encounter (Signed)
We can try Onglyza and i have sent it to the pharmacy.

## 2013-05-18 ENCOUNTER — Other Ambulatory Visit: Payer: Self-pay

## 2013-05-18 DIAGNOSIS — Z1231 Encounter for screening mammogram for malignant neoplasm of breast: Secondary | ICD-10-CM

## 2013-05-18 NOTE — Telephone Encounter (Signed)
Patient advised, coupon provided at front for her to pick up

## 2013-06-07 ENCOUNTER — Ambulatory Visit
Admission: RE | Admit: 2013-06-07 | Discharge: 2013-06-07 | Disposition: A | Discharge status: Home / Self Care | Payer: 59 | Source: Ambulatory Visit

## 2013-06-07 DIAGNOSIS — Z1231 Encounter for screening mammogram for malignant neoplasm of breast: Secondary | ICD-10-CM

## 2013-07-14 ENCOUNTER — Telehealth: Payer: Self-pay

## 2013-07-14 DIAGNOSIS — K219 Gastro-esophageal reflux disease without esophagitis: Secondary | ICD-10-CM

## 2013-07-14 NOTE — Telephone Encounter (Signed)
Is she having trouble with the protonix? Called her. She indicates no longer covered by her insurance. She is not sure of the preferred drug. She will find out and let me know.

## 2013-07-14 NOTE — Telephone Encounter (Signed)
PT WOULD LIKE TO HAVE SOMETHING ELSE CALLED INSTEAD OF THE PROTONIX. PLEASE CALL [phone]   [facility] [address]

## 2013-07-19 MED ORDER — PANTOPRAZOLE SODIUM 40 MG PO TBEC: 40.0000 mg | DELAYED_RELEASE_TABLET | Freq: Every day | ORAL | Status: DC

## 2013-07-19 NOTE — Telephone Encounter (Signed)
Sent protonix to mail order

## 2013-07-19 NOTE — Telephone Encounter (Signed)
Spoke with pt, her insurance will cover Pantoprozale with mail order. I changed pharmacy to [facility].

## 2013-08-07 ENCOUNTER — Ambulatory Visit (INDEPENDENT_AMBULATORY_CARE_PROVIDER_SITE_OTHER): Payer: 59

## 2013-08-08 ENCOUNTER — Ambulatory Visit (INDEPENDENT_AMBULATORY_CARE_PROVIDER_SITE_OTHER): Payer: 59 | Admitting: Emergency Medicine

## 2013-08-08 VITALS — BP 162/100 | HR 78 | Temp 98.3°F | Resp 18 | Ht 66.5 in | Wt 269.2 lb

## 2013-08-08 DIAGNOSIS — I10 Essential (primary) hypertension: Secondary | ICD-10-CM

## 2013-08-08 DIAGNOSIS — E7800 Pure hypercholesterolemia, unspecified: Secondary | ICD-10-CM

## 2013-08-08 DIAGNOSIS — E782 Mixed hyperlipidemia: Secondary | ICD-10-CM

## 2013-08-08 DIAGNOSIS — E119 Type 2 diabetes mellitus without complications: Secondary | ICD-10-CM

## 2013-08-08 DIAGNOSIS — E785 Hyperlipidemia, unspecified: Secondary | ICD-10-CM

## 2013-08-08 DIAGNOSIS — K219 Gastro-esophageal reflux disease without esophagitis: Secondary | ICD-10-CM

## 2013-08-08 LAB — LIPID PANEL
Cholesterol: 192 mg/dL (ref 0–200)
HDL: 59 mg/dL (ref >39)
LDL Cholesterol: 120 mg/dL — ABNORMAL HIGH (ref 0–99)
Total CHOL/HDL Ratio: 3.3 Ratio
Triglycerides: 65 mg/dL (ref <150)
VLDL: 13 mg/dL (ref 0–40)

## 2013-08-08 LAB — COMPREHENSIVE METABOLIC PANEL
ALT: 37 U/L — ABNORMAL HIGH (ref 0–35)
AST: 19 U/L (ref 0–37)
Albumin: 4.4 g/dL (ref 3.5–5.2)
Alkaline Phosphatase: 64 U/L (ref 39–117)
BUN: 9 mg/dL (ref 6–23)
CO2: 31 mEq/L (ref 19–32)
Calcium: 9.6 mg/dL (ref 8.4–10.5)
Chloride: 98 mEq/L (ref 96–112)
Creat: 0.65 mg/dL (ref 0.50–1.10)
Glucose, Bld: 213 mg/dL — ABNORMAL HIGH (ref 70–99)
Potassium: 4.1 mEq/L (ref 3.5–5.3)
Sodium: 136 mEq/L (ref 135–145)
Total Bilirubin: 0.6 mg/dL (ref 0.3–1.2)
Total Protein: 7.6 g/dL (ref 6.0–8.3)

## 2013-08-08 LAB — POCT UA - MICROSCOPIC ONLY
Casts, Ur, LPF, POC: NEGATIVE
Crystals, Ur, HPF, POC: NEGATIVE
Epithelial cells, urine per micros: NEGATIVE
Mucus, UA: NEGATIVE
RBC, urine, microscopic: NEGATIVE
Yeast, UA: NEGATIVE

## 2013-08-08 LAB — POCT CBC
Granulocyte percent: 43 %G (ref 37–80)
HCT, POC: 41.8 % (ref 37.7–47.9)
Hemoglobin: 12.7 g/dL (ref 12.2–16.2)
Lymph, poc: 6.2 — AB (ref 0.6–3.4)
MCH, POC: 25.1 pg — AB (ref 27–31.2)
MCHC: 30.4 g/dL — AB (ref 31.8–35.4)
MCV: 82.8 fL (ref 80–97)
MID (cbc): 1.1 — AB (ref 0–0.9)
MPV: 9.8 fL (ref 0–99.8)
POC Granulocyte: 5.5 (ref 2–6.9)
POC LYMPH PERCENT: 48.6 %L (ref 10–50)
POC MID %: 8.4 %M (ref 0–12)
Platelet Count, POC: 251 10*3/uL (ref 142–424)
RBC: 5.05 M/uL (ref 4.04–5.48)
RDW, POC: 15 %
WBC: 12.7 10*3/uL — AB (ref 4.6–10.2)

## 2013-08-08 LAB — POCT URINALYSIS DIPSTICK
Bilirubin, UA: NEGATIVE
Blood, UA: NEGATIVE
Glucose, UA: 100
Ketones, UA: NEGATIVE
Leukocytes, UA: NEGATIVE
Nitrite, UA: NEGATIVE
Spec Grav, UA: 1.025
Urobilinogen, UA: 0.2
pH, UA: 6

## 2013-08-08 LAB — TSH: TSH: 2.974 u[IU]/mL (ref 0.350–4.500)

## 2013-08-08 LAB — POCT GLYCOSYLATED HEMOGLOBIN (HGB A1C): Hemoglobin A1C: 8.1

## 2013-08-08 MED ORDER — METFORMIN HCL 1000 MG PO TABS: 1000.0000 mg | ORAL_TABLET | Freq: Two times a day (BID) | ORAL | Status: DC

## 2013-08-08 NOTE — Progress Notes (Signed)
[facility] [address], [place] [address] [phone]  Date:  08/08/2013   Name:  [name]   DOB:  [birth year]   MRN:  [id number]  PCP:  Rod Mae    Chief Complaint: Medication Refill   History of Present Illness:  [name] is a 53 y.o. very pleasant female patient who presents with the following:  Patient has need of refills on her medications.  Has NIDDM FBS running 40-180, HBP and HLD.  Overweight.  Non smoker.  Fasting.  Needs colonoscopy  Patient Active Problem List   Diagnosis Date Noted  . HTN (hypertension) 06/13/2012  . Diabetes mellitus type II, uncontrolled 11/14/2011  . Diabetic peripheral neuropathy associated with type 2 diabetes mellitus 11/14/2011  . Obesity, Class III, BMI 40-49.9 (morbid obesity) 11/14/2011  . Menopause 11/14/2011  . Mass on back 10/28/2011  . Pleural effusion 03/20/2011  . Cholecystitis chronic 01/24/2011  . Serous cystadenoma of pancreas 01/24/2011    Past Medical History  Diagnosis Date  . Hypertension   . Diabetes mellitus   . Numbness of fingers   . Hyperlipidemia   . Abdominal pain     pain around incision area   . Costochondritis   . Allergy   . Cancer   . GERD (gastroesophageal reflux disease)   . Anemia     Past Surgical History  Procedure Laterality Date  . Cholecystectomy    . Splenectomy, partial  01/09/2011  . Pancreatectomy  01/09/2011  . Cesarean section    . Cystectomy    . Breast biopsy      left  . Abdominal hysterectomy      partial    History  Substance Use Topics  . Smoking status: Former Smoker    Quit date: 12/19/1981  . Smokeless tobacco: Never Used  . Alcohol Use: 0.5 oz/week    1 drink(s) per week    Family History  Problem Relation Age of Onset  . Heart disease Father     died at age 94  . Stroke Mother 45    per pt stroke from high doses of iron injections  . Cancer Mother 17    cervical  . Diabetes Mother     Allergies  Allergen  Reactions  . Norvasc [Amlodipine] Nausea Only    Medication list has been reviewed and updated.  Current Outpatient Prescriptions on File Prior to Visit  Medication Sig Dispense Refill  . albuterol (PROAIR HFA) 108 (90 BASE) MCG/ACT inhaler Inhale 2 puffs into the lungs every 6 (six) hours as needed.  1 Inhaler  3  . glucose blood (ONE TOUCH ULTRA TEST) test strip Use as directed  100 each  3  . ibuprofen (ADVIL,MOTRIN) 600 MG tablet Take 1 tablet (600 mg total) by mouth every 6 (six) hours as needed.  120 tablet  3  . lisinopril-hydrochlorothiazide (PRINZIDE,ZESTORETIC) 20-12.5 MG per tablet Take 1 tablet by mouth daily.  90 tablet  0  . metFORMIN (GLUCOPHAGE) 1000 MG tablet Take 1 tablet (1,000 mg total) by mouth 2 (two) times daily with a meal. Need office visit and labs for additional refills.  180 tablet  0  . Pancrelipase, Lip-Prot-Amyl, 5000 UNITS CPEP Take 1 capsule (5,000 Units total) by mouth 3 (three) times daily before meals.  270 capsule  0  . pantoprazole (PROTONIX) 40 MG tablet Take 1 tablet (40 mg total) by mouth daily.  90 tablet  1  . pravastatin (PRAVACHOL) 80  MG tablet Take 1 tablet (80 mg total) by mouth daily.  90 tablet  0  . pseudoephedrine-guaifenesin (MUCINEX D) 60-600 MG per tablet Take 1 tablet by mouth every 12 (twelve) hours.  18 tablet  0  . saxagliptin HCl (ONGLYZA) 2.5 MG TABS tablet Take 1 tablet (2.5 mg total) by mouth daily.  30 tablet  2  . cyclobenzaprine (FLEXERIL) 5 MG tablet Take 1 tablet (5 mg total) by mouth 3 (three) times daily as needed for muscle spasms.  30 tablet  0  . HYDROcodone-acetaminophen (NORCO/VICODIN) 5-325 MG per tablet Take 1 tablet by mouth every 8 (eight) hours as needed for pain.  30 tablet  0   No current facility-administered medications on file prior to visit.    Review of Systems:  As per HPI, otherwise negative.    Physical Examination: Filed Vitals:   08/08/13 0836  BP: 162/100  Pulse: 78  Temp: 98.3 F (36.8 C)   Resp: 18   Filed Vitals:   08/08/13 0836  Height: 5' 6.5" (1.689 m)  Weight: 269 lb 3.2 oz (122.108 kg)   Body mass index is 42.8 kg/(m^2). Ideal Body Weight: Weight in (lb) to have BMI = 25: 156.9  GEN: Obesity, NAD, Non-toxic, A & O x 3 HEENT: Atraumatic, Normocephalic. Neck supple. No masses, No LAD. Ears and Nose: No external deformity. CV: RRR, No M/G/R. No JVD. No thrill. No extra heart sounds. PULM: CTA B, no wheezes, crackles, rhonchi. No retractions. No resp. distress. No accessory muscle use. ABD: S, NT, ND, +BS. No rebound. No HSM. EXTR: No c/c/e NEURO Normal gait.  PSYCH: Normally interactive. Conversant. Not depressed or anxious appearing.  Calm demeanor.    Assessment and Plan: NIDDM with FBS 140-180. HTN BP yesterday in office 142.80 Hyperlipidemia Obesity Labs refills Needs colonoscopy  Signed,  Ellison Carwin, MD   Results for orders placed in visit on 08/08/13  POCT CBC      Result Value Range   WBC 12.7 (*) 4.6 - 10.2 K/uL   Lymph, poc 6.2 (*) 0.6 - 3.4   POC LYMPH PERCENT 48.6  10 - 50 %L   MID (cbc) 1.1 (*) 0 - 0.9   POC MID % 8.4  0 - 12 %M   POC Granulocyte 5.5  2 - 6.9   Granulocyte percent 43.0  37 - 80 %G   RBC 5.05  4.04 - 5.48 M/uL   Hemoglobin 12.7  12.2 - 16.2 g/dL   HCT, POC 41.8  37.7 - 47.9 %   MCV 82.8  80 - 97 fL   MCH, POC 25.1 (*) 27 - 31.2 pg   MCHC 30.4 (*) 31.8 - 35.4 g/dL   RDW, POC 15.0     Platelet Count, POC 251  142 - 424 K/uL   MPV 9.8  0 - 99.8 fL  POCT URINALYSIS DIPSTICK      Result Value Range   Color, UA yellow     Clarity, UA clear     Glucose, UA 100     Bilirubin, UA neg     Ketones, UA neg     Spec Grav, UA 1.025     Blood, UA neg     pH, UA 6.0     Protein, UA trace     Urobilinogen, UA 0.2     Nitrite, UA neg     Leukocytes, UA Negative    POCT UA - MICROSCOPIC ONLY      Result Value  Range   WBC, Ur, HPF, POC 1-2     RBC, urine, microscopic neg     Bacteria, U Microscopic 1-2      Mucus, UA neg     Epithelial cells, urine per micros neg     Crystals, Ur, HPF, POC neg     Casts, Ur, LPF, POC neg     Yeast, UA neg    POCT GLYCOSYLATED HEMOGLOBIN (HGB A1C)      Result Value Range   Hemoglobin A1C 8.1

## 2013-08-08 NOTE — Patient Instructions (Signed)
Type 2 Diabetes Mellitus, Adult Type 2 diabetes mellitus, often simply referred to as type 2 diabetes, is a long-lasting (chronic) disease. In type 2 diabetes, the pancreas does not make enough insulin (a hormone), the cells are less responsive to the insulin that is made (insulin resistance), or both. Normally, insulin moves sugars from food into the tissue cells. The tissue cells use the sugars for energy. The lack of insulin or the lack of normal response to insulin causes excess sugars to build up in the blood instead of going into the tissue cells. As a result, high blood sugar (hyperglycemia) develops. The effect of high sugar (glucose) levels can cause many complications. Type 2 diabetes was also previously called adult-onset diabetes but it can occur at any age.  RISK FACTORS  A person is predisposed to developing type 2 diabetes if someone in the family has the disease and also has one or more of the following primary risk factors:  Overweight.  An inactive lifestyle.  A history of consistently eating high-calorie foods. Maintaining a normal weight and regular physical activity can reduce the chance of developing type 2 diabetes. SYMPTOMS  A person with type 2 diabetes may not show symptoms initially. The symptoms of type 2 diabetes appear slowly. The symptoms include:  Increased thirst (polydipsia).  Increased urination (polyuria).  Increased urination during the night (nocturia).  Weight loss. This weight loss may be rapid.  Frequent, recurring infections.  Tiredness (fatigue).  Weakness.  Vision changes, such as blurred vision.  Fruity smell to your breath.  Abdominal pain.  Nausea or vomiting.  Cuts or bruises which are slow to heal.  Tingling or numbness in the hands or feet. DIAGNOSIS Type 2 diabetes is frequently not diagnosed until complications of diabetes are present. Type 2 diabetes is diagnosed when symptoms or complications are present and when blood  glucose levels are increased. Your blood glucose level may be checked by one or more of the following blood tests:  A fasting blood glucose test. You will not be allowed to eat for at least 8 hours before a blood sample is taken.  A random blood glucose test. Your blood glucose is checked at any time of the day regardless of when you ate.  A hemoglobin A1c blood glucose test. A hemoglobin A1c test provides information about blood glucose control over the previous 3 months.  An oral glucose tolerance test (OGTT). Your blood glucose is measured after you have not eaten (fasted) for 2 hours and then after you drink a glucose-containing beverage. TREATMENT   You may need to take insulin or diabetes medicine daily to keep blood glucose levels in the desired range.  You will need to match insulin dosing with exercise and healthy food choices. The treatment goal is to maintain the before meal blood sugar (preprandial glucose) level at 70 130 mg/dL. HOME CARE INSTRUCTIONS   Have your hemoglobin A1c level checked twice a year.  Perform daily blood glucose monitoring as directed by your caregiver.  Monitor urine ketones when you are ill and as directed by your caregiver.  Take your diabetes medicine or insulin as directed by your caregiver to maintain your blood glucose levels in the desired range.  Never run out of diabetes medicine or insulin. It is needed every day.  Adjust insulin based on your intake of carbohydrates. Carbohydrates can raise blood glucose levels but need to be included in your diet. Carbohydrates provide vitamins, minerals, and fiber which are an essential part of   a healthy diet. Carbohydrates are found in fruits, vegetables, whole grains, dairy products, legumes, and foods containing added sugars.    Eat healthy foods. Alternate 3 meals with 3 snacks.  Lose weight if overweight.  Carry a medical alert card or wear your medical alert jewelry.  Carry a 15 gram  carbohydrate snack with you at all times to treat low blood glucose (hypoglycemia). Some examples of 15 gram carbohydrate snacks include:  Glucose tablets, 3 or 4   Glucose gel, 15 gram tube  Raisins, 2 tablespoons (24 grams)  Jelly beans, 6  Animal crackers, 8  Regular pop, 4 ounces (120 mL)  Gummy treats, 9  Recognize hypoglycemia. Hypoglycemia occurs with blood glucose levels of 70 mg/dL and below. The risk for hypoglycemia increases when fasting or skipping meals, during or after intense exercise, and during sleep. Hypoglycemia symptoms can include:  Tremors or shakes.  Decreased ability to concentrate.  Sweating.  Increased heart rate.  Headache.  Dry mouth.  Hunger.  Irritability.  Anxiety.  Restless sleep.  Altered speech or coordination.  Confusion.  Treat hypoglycemia promptly. If you are alert and able to safely swallow, follow the 15:15 rule:  Take 15 20 grams of rapid-acting glucose or carbohydrate. Rapid-acting options include glucose gel, glucose tablets, or 4 ounces (120 mL) of fruit juice, regular soda, or low fat milk.  Check your blood glucose level 15 minutes after taking the glucose.  Take 15 20 grams more of glucose if the repeat blood glucose level is still 70 mg/dL or below.  Eat a meal or snack within 1 hour once blood glucose levels return to normal.    Be alert to polyuria and polydipsia which are early signs of hyperglycemia. An early awareness of hyperglycemia allows for prompt treatment. Treat hyperglycemia as directed by your caregiver.  Engage in at least 150 minutes of moderate-intensity physical activity a week, spread over at least 3 days of the week or as directed by your caregiver. In addition, you should engage in resistance exercise at least 2 times a week or as directed by your caregiver.  Adjust your medicine and food intake as needed if you start a new exercise or sport.  Follow your sick day plan at any time you  are unable to eat or drink as usual.  Avoid tobacco use.  Limit alcohol intake to no more than 1 drink per day for nonpregnant women and 2 drinks per day for men. You should drink alcohol only when you are also eating food. Talk with your caregiver whether alcohol is safe for you. Tell your caregiver if you drink alcohol several times a week.  Follow up with your caregiver regularly.  Schedule an eye exam soon after the diagnosis of type 2 diabetes and then annually.  Perform daily skin and foot care. Examine your skin and feet daily for cuts, bruises, redness, nail problems, bleeding, blisters, or sores. A foot exam by a caregiver should be done annually.  Brush your teeth and gums at least twice a day and floss at least once a day. Follow up with your dentist regularly.  Share your diabetes management plan with your workplace or school.  Stay up-to-date with immunizations.  Learn to manage stress.  Obtain ongoing diabetes education and support as needed.  Participate in, or seek rehabilitation as needed to maintain or improve independence and quality of life. Request a physical or occupational therapy referral if you are having foot or hand numbness or difficulties with grooming,   dressing, eating, or physical activity. SEEK MEDICAL CARE IF:   You are unable to eat food or drink fluids for more than 6 hours.  You have nausea and vomiting for more than 6 hours.  Your blood glucose level is over 240 mg/dL.  There is a change in mental status.  You develop an additional serious illness.  You have diarrhea for more than 6 hours.  You have been sick or have had a fever for a couple of days and are not getting better.  You have pain during any physical activity.  SEEK IMMEDIATE MEDICAL CARE IF:  You have difficulty breathing.  You have moderate to large ketone levels. MAKE SURE YOU:  Understand these instructions.  Will watch your condition.  Will get help right away if  you are not doing well or get worse. Document Released: 07/01/2005 Document Revised: 03/25/2012 Document Reviewed: 01/28/2012 Exit[employer] Patient Information 2014 [employer].  

## 2013-08-09 MED ORDER — PRAVASTATIN SODIUM 80 MG PO TABS: 80.0000 mg | ORAL_TABLET | Freq: Every day | ORAL | Status: DC

## 2013-08-09 NOTE — Addendum Note (Signed)
Addended by: Roselee Culver on: 08/09/2013 09:20 AM   Modules accepted: Orders

## 2013-08-12 ENCOUNTER — Other Ambulatory Visit: Payer: Self-pay | Admitting: Physician Assistant

## 2013-08-12 ENCOUNTER — Encounter: Payer: Self-pay | Admitting: *Deleted

## 2013-08-12 ENCOUNTER — Encounter: Payer: 59 | Attending: Emergency Medicine | Admitting: *Deleted

## 2013-08-12 VITALS — Ht 66.0 in | Wt 265.8 lb

## 2013-08-12 DIAGNOSIS — E1165 Type 2 diabetes mellitus with hyperglycemia: Secondary | ICD-10-CM

## 2013-08-12 DIAGNOSIS — Z713 Dietary counseling and surveillance: Secondary | ICD-10-CM | POA: Insufficient documentation

## 2013-08-12 DIAGNOSIS — IMO0002 Reserved for concepts with insufficient information to code with codable children: Secondary | ICD-10-CM

## 2013-08-12 DIAGNOSIS — E1142 Type 2 diabetes mellitus with diabetic polyneuropathy: Secondary | ICD-10-CM

## 2013-08-12 DIAGNOSIS — E119 Type 2 diabetes mellitus without complications: Secondary | ICD-10-CM | POA: Insufficient documentation

## 2013-08-12 NOTE — Progress Notes (Signed)
Appt start time: 0800 end time:  0900.  Assessment:  Patient was seen on  08/12/13 for individual diabetes education. Lives with husband, son and daughter in law. She shops and they share the cooking of the food. 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  Just got her Master's MBA degree last year. She states she SMBG and her range 138 - 200, except when she ran out of her medicine for a few days it went up to 318 mg/dl.   Current HbA1c: 8.1% on 08/08/13  Preferred Learning Style:   No preference indicated   Learning Readiness:   Ready  Change in progress  MEDICATIONS: see list. Diabetes medications are Metformin and Onglyza  DIETARY INTAKE: She uses [employer] APP to keep track of her food 24-hr recall:  B (8-10 AM): Smoothie with soy protein, nonfat greek yogurt, frozen fruit, with ice and water. Sips on this at work for a couple of hours, hot tea Snk (10 AM): raw vegetables with cheese stick and almonds, ranch dressing  L ( PM): chicken with 15 bean soup at home often OR take out with salad occasionally with potato salad and cheese pizza, water  Snk ( PM): fresh fruit D ( PM): meat, starch, vegetables sometimes with cheese sauce on it, water or diet soda Snk ( PM): popcorn OR fresh fruit Beverages: water, hot tea, diet soda  Usual physical activity: She has resumed mall walking this past week with her husband before she goes to work in the AM. Her goal is 10,000 steps a day and she has gotten up to 7000 - 10,000 so far. She is also going to the [facility] where she walks the inside track. She has enjoyed water aerobics and plans to resume those classes with her friend in the near future.  Estimated energy needs: 1400 calories 158 g carbohydrates 105 g protein 39 g fat    Intervention:  Nutrition counseling provided.  Discussed diabetes disease process and treatment options.  Discussed physiology of diabetes and role of obesity  on insulin resistance.  Encouraged moderate weight reduction to improve glucose levels.  Discussed role of medications and diet in glucose control  Provided education on macronutrients on glucose levels.  Provided education on carb counting, importance of regularly scheduled meals/snacks, and meal planning  Discussed effects of physical activity on glucose levels and long-term glucose control.  Recommended 150 minutes of physical activity/week.  Reviewed patient medications.  Discussed role of medication on blood glucose and possible side effects  Discussed blood glucose monitoring and interpretation.  Discussed recommended target ranges and individual ranges.    Described short-term complications: hyper- and hypo-glycemia.  Discussed causes,symptoms, and treatment options.  Discussed role of stress on blood glucose levels and discussed strategies to manage psychosocial issues.  Provided information on:  Prevention, detection, and treatment of long-term complications.  Discussed the role of prolonged elevated glucose levels on body systems.  Discussed recommendations for long-term diabetes self-care.  Established checklist for medical, dental, and emotional self-care.  Plan:  Aim for 3 Carb Choices per meal (45 grams) +/- 1 either way  Aim for 0-2 Carbs per snack if hungry  Consider reading food labels for Total Carbohydrate of foods Continue with your activity level mall walking with your husband daily  as tolerated Consider checking BG at alternate times per day   Teaching Method Utilized: Visual, Auditory, Hands on  Handouts given during visit include: Living  Well with Diabetes Carb Counting and Food Label handouts Meal Plan Card  Barriers to learning/adherence to lifestyle change: none  Diabetes self-care support plan:   [facility] support group  Demonstrated degree of understanding via:  Teach Back   Monitoring/Evaluation:  Dietary intake, exercise, reading food labels, and  body weight in 1 month.

## 2013-08-12 NOTE — Patient Instructions (Signed)
Plan:  Aim for 3 Carb Choices per meal (45 grams) +/- 1 either way  Aim for 0-2 Carbs per snack if hungry  Consider reading food labels for Total Carbohydrate of foods Continue with your activity level mall walking with your husband daily  as tolerated Consider checking BG at alternate times per day

## 2013-09-04 ENCOUNTER — Telehealth: Payer: Self-pay

## 2013-09-04 DIAGNOSIS — E119 Type 2 diabetes mellitus without complications: Secondary | ICD-10-CM

## 2013-09-04 NOTE — Telephone Encounter (Signed)
Patient called stated she is waiting for a refill request on Onglyza, She called the pharmacy today and yesterday and stated [facility] is waiting on authorization from [place].

## 2013-09-05 MED ORDER — SAXAGLIPTIN HCL 2.5 MG PO TABS: 2.5000 mg | ORAL_TABLET | Freq: Every day | ORAL | Status: DC

## 2013-09-05 NOTE — Telephone Encounter (Signed)
Please notify patient. No request received from her pharmacy.  Meds ordered this encounter  Medications  . saxagliptin HCl (ONGLYZA) 2.5 MG TABS tablet    Sig: Take 1 tablet (2.5 mg total) by mouth daily.    Dispense:  30 tablet    Refill:  2    Order Specific Question:  Supervising Provider    Answer:  DOOLITTLE, ROBERT P [[id number]]

## 2013-09-05 NOTE — Telephone Encounter (Signed)
SPOKE TO PATIENT ABOUT HER SCRIPT BEING CALLED.

## 2013-09-28 ENCOUNTER — Ambulatory Visit: Payer: 59 | Admitting: *Deleted

## 2013-10-20 ENCOUNTER — Ambulatory Visit: Payer: 59 | Admitting: *Deleted

## 2013-11-15 ENCOUNTER — Other Ambulatory Visit: Payer: Self-pay | Admitting: Emergency Medicine

## 2013-11-16 ENCOUNTER — Telehealth: Payer: Self-pay

## 2013-11-16 NOTE — Telephone Encounter (Signed)
PA needed for Onglyza. Completed form and faxed.

## 2013-11-17 NOTE — Telephone Encounter (Signed)
Pt calling to get a RF of her Onglyza. Let her know that a PA was required. She is going to try calling the Pharm and the ins company to try to expedite things because she is going out of town.

## 2013-12-20 ENCOUNTER — Other Ambulatory Visit: Payer: Self-pay

## 2013-12-20 DIAGNOSIS — E119 Type 2 diabetes mellitus without complications: Secondary | ICD-10-CM

## 2013-12-20 MED ORDER — SAXAGLIPTIN HCL 2.5 MG PO TABS: 2.5000 mg | ORAL_TABLET | Freq: Every day | ORAL | Status: DC

## 2013-12-20 NOTE — Telephone Encounter (Signed)
Called pharm bc never heard back from ins. PA was approved and pt p/up on 11/18/13 last month.

## 2013-12-31 ENCOUNTER — Ambulatory Visit (INDEPENDENT_AMBULATORY_CARE_PROVIDER_SITE_OTHER): Payer: 59 | Admitting: Family Medicine

## 2013-12-31 VITALS — BP 142/98 | HR 75 | Temp 98.3°F | Resp 18 | Ht 66.5 in | Wt 271.2 lb

## 2013-12-31 DIAGNOSIS — E1142 Type 2 diabetes mellitus with diabetic polyneuropathy: Secondary | ICD-10-CM

## 2013-12-31 DIAGNOSIS — E7800 Pure hypercholesterolemia, unspecified: Secondary | ICD-10-CM

## 2013-12-31 DIAGNOSIS — J449 Chronic obstructive pulmonary disease, unspecified: Secondary | ICD-10-CM

## 2013-12-31 DIAGNOSIS — E119 Type 2 diabetes mellitus without complications: Secondary | ICD-10-CM

## 2013-12-31 DIAGNOSIS — IMO0001 Reserved for inherently not codable concepts without codable children: Secondary | ICD-10-CM

## 2013-12-31 DIAGNOSIS — E1129 Type 2 diabetes mellitus with other diabetic kidney complication: Secondary | ICD-10-CM

## 2013-12-31 DIAGNOSIS — E1121 Type 2 diabetes mellitus with diabetic nephropathy: Secondary | ICD-10-CM

## 2013-12-31 DIAGNOSIS — I10 Essential (primary) hypertension: Secondary | ICD-10-CM

## 2013-12-31 DIAGNOSIS — E1165 Type 2 diabetes mellitus with hyperglycemia: Secondary | ICD-10-CM

## 2013-12-31 DIAGNOSIS — N058 Unspecified nephritic syndrome with other morphologic changes: Secondary | ICD-10-CM

## 2013-12-31 DIAGNOSIS — E1149 Type 2 diabetes mellitus with other diabetic neurological complication: Secondary | ICD-10-CM

## 2013-12-31 DIAGNOSIS — K219 Gastro-esophageal reflux disease without esophagitis: Secondary | ICD-10-CM

## 2013-12-31 DIAGNOSIS — IMO0002 Reserved for concepts with insufficient information to code with codable children: Secondary | ICD-10-CM

## 2013-12-31 LAB — POCT GLYCOSYLATED HEMOGLOBIN (HGB A1C): Hemoglobin A1C: 8.3

## 2013-12-31 LAB — GLUCOSE, POCT (MANUAL RESULT ENTRY): POC Glucose: 147 mg/dl — AB (ref 70–99)

## 2013-12-31 MED ORDER — PRAVASTATIN SODIUM 80 MG PO TABS: 80.0000 mg | ORAL_TABLET | Freq: Every day | ORAL | Status: DC

## 2013-12-31 MED ORDER — METOPROLOL SUCCINATE ER 25 MG PO TB24: 25.0000 mg | ORAL_TABLET | Freq: Every day | ORAL | Status: DC

## 2013-12-31 MED ORDER — LISINOPRIL-HYDROCHLOROTHIAZIDE 20-12.5 MG PO TABS: 1.0000 | ORAL_TABLET | Freq: Every day | ORAL | Status: DC

## 2013-12-31 MED ORDER — PANTOPRAZOLE SODIUM 40 MG PO TBEC: 40.0000 mg | DELAYED_RELEASE_TABLET | Freq: Every day | ORAL | Status: DC

## 2013-12-31 MED ORDER — METFORMIN HCL 1000 MG PO TABS: 1000.0000 mg | ORAL_TABLET | Freq: Two times a day (BID) | ORAL | Status: DC

## 2013-12-31 MED ORDER — SAXAGLIPTIN HCL 5 MG PO TABS: 5.0000 mg | ORAL_TABLET | Freq: Every day | ORAL | Status: DC

## 2013-12-31 MED ORDER — ALBUTEROL SULFATE HFA 108 (90 BASE) MCG/ACT IN AERS: 2.0000 | INHALATION_SPRAY | Freq: Four times a day (QID) | RESPIRATORY_TRACT | Status: DC | PRN

## 2013-12-31 NOTE — Patient Instructions (Addendum)
Increase the on +5 mg daily  Continue taking metformin 1000 mg twice daily  Try hard to increase exercise.  Continued to be very honest with yourself on what you eat to try and decrease her oral intake. Especially cut back on the breads, pasta, rice, potatoes, and other starches.  Add metoprolol 25 mg one daily for blood pressure  Return in 6 weeks for recheck.

## 2013-12-31 NOTE — Progress Notes (Signed)
Subjective: 53 year old lady with diabetes who is here for recheck. She's had more problem with her sugars. She is busy working all day, going to help care for her mother before work and again after work, and has little time for exercise and other activities. She is fast food consequently. Does try and get regular exercise she can try and watch her diet, but has had her wait a little bit. She says it fluctuates within about 10 range which appears to be correct I review her old chart. She has a little bit of headache and lightheadedness. No major acute complaints. Some mild nonspecific chest discomfort since he thinks his heartburn related. She doesn't get any pain with using stairs, which she has to do at work. Not presently having much in the way of diabetic neuropathy symptoms.  Objective: Obese lady in no acute distress. Throat clear. Neck supple without nodes or thyromegaly. Chest clear. Heart regular without murmurs. And soft the mass or tenderness.  Results for orders placed in visit on 12/31/13  GLUCOSE, POCT (MANUAL RESULT ENTRY)      Result Value Ref Range   POC Glucose 147 (*) 70 - 99 mg/dl  POCT GLYCOSYLATED HEMOGLOBIN (HGB A1C)      Result Value Ref Range   Hemoglobin A1C 8.3       Assessment: Diabetes unsatisfactory control Hypertension unsatisfactory control -Hypertension unsatisfactory control Hyperlipidemia  Obesity Stress in caring for mother  Plan: Increasing Onglyza to 5 mg daily

## 2014-01-01 LAB — LIPID PANEL
Cholesterol: 190 mg/dL (ref 0–200)
HDL: 58 mg/dL (ref >39)
LDL Cholesterol: 122 mg/dL — ABNORMAL HIGH (ref 0–99)
Total CHOL/HDL Ratio: 3.3 Ratio
Triglycerides: 52 mg/dL (ref <150)
VLDL: 10 mg/dL (ref 0–40)

## 2014-01-01 LAB — COMPLETE METABOLIC PANEL WITH GFR
ALT: 42 U/L — ABNORMAL HIGH (ref 0–35)
AST: 22 U/L (ref 0–37)
Albumin: 4.3 g/dL (ref 3.5–5.2)
Alkaline Phosphatase: 55 U/L (ref 39–117)
BUN: 10 mg/dL (ref 6–23)
CO2: 26 mEq/L (ref 19–32)
Calcium: 9.5 mg/dL (ref 8.4–10.5)
Chloride: 102 mEq/L (ref 96–112)
Creat: 0.63 mg/dL (ref 0.50–1.10)
GFR, Est African American: >89 mL/min
GFR, Est Non African American: >89 mL/min
Glucose, Bld: 147 mg/dL — ABNORMAL HIGH (ref 70–99)
Potassium: 4.2 mEq/L (ref 3.5–5.3)
Sodium: 138 mEq/L (ref 135–145)
Total Bilirubin: 0.5 mg/dL (ref 0.2–1.2)
Total Protein: 7.3 g/dL (ref 6.0–8.3)

## 2014-01-03 ENCOUNTER — Encounter: Payer: Self-pay | Admitting: Family Medicine

## 2014-03-08 ENCOUNTER — Other Ambulatory Visit: Payer: Self-pay

## 2014-03-08 MED ORDER — GLUCOSE BLOOD VI STRP: ORAL_STRIP | Status: DC

## 2014-05-19 ENCOUNTER — Other Ambulatory Visit: Payer: Self-pay

## 2014-05-19 NOTE — Telephone Encounter (Signed)
Pharm reqs RF of pancrelipase. Dr Linna Darner, you saw pt for check up in June, and this was on her med list, but wanted to make sure you want to RF for pt?

## 2014-05-23 ENCOUNTER — Telehealth: Payer: Self-pay | Admitting: *Deleted

## 2014-05-23 MED ORDER — PANCRELIPASE (LIP-PROT-AMYL) 5000 UNITS PO CPEP: ORAL_CAPSULE | ORAL | Status: DC

## 2014-05-23 NOTE — Telephone Encounter (Deleted)
Spoke with [name] she is going to call an make an appointment. Explained she needs a follow up for any additional refills. Patient understood.

## 2014-05-23 NOTE — Telephone Encounter (Signed)
Call: She was to return in 6-8 weeks and did not.    Give her one month refill, but need to be seen.  It doesn't have to be by me.

## 2014-05-31 ENCOUNTER — Telehealth: Payer: Self-pay

## 2014-05-31 MED ORDER — PANCRELIPASE (LIP-PROT-AMYL) 5000 UNITS PO CPEP: 1.0000 | ORAL_CAPSULE | Freq: Three times a day (TID) | ORAL | Status: DC

## 2014-05-31 NOTE — Telephone Encounter (Signed)
[facility] faxed req to change Pancrelipase Rx to Zenpep 5000 DR cap (Lip-Prot-Amyl  [phone]), bc pancrealipawse is unavailable from their wholesaler. Sarah, please advise.

## 2014-06-01 ENCOUNTER — Telehealth: Payer: Self-pay

## 2014-06-01 NOTE — Telephone Encounter (Signed)
Got fax from [facility] asking for order for DM supplies. Called pt who stated that she did NOT want supplies from this company and she always denies the orders when she receives phone calls about it. I faxed back denial w/note stating this.

## 2014-06-06 ENCOUNTER — Other Ambulatory Visit: Payer: Self-pay | Admitting: Family Medicine

## 2014-06-17 ENCOUNTER — Ambulatory Visit (INDEPENDENT_AMBULATORY_CARE_PROVIDER_SITE_OTHER): Payer: PRIVATE HEALTH INSURANCE | Admitting: Emergency Medicine

## 2014-06-17 VITALS — BP 164/94 | HR 70 | Temp 98.6°F | Resp 16 | Ht 66.0 in | Wt 264.0 lb

## 2014-06-17 DIAGNOSIS — R635 Abnormal weight gain: Secondary | ICD-10-CM

## 2014-06-17 DIAGNOSIS — R030 Elevated blood-pressure reading, without diagnosis of hypertension: Secondary | ICD-10-CM

## 2014-06-17 DIAGNOSIS — R109 Unspecified abdominal pain: Secondary | ICD-10-CM

## 2014-06-17 DIAGNOSIS — Z23 Encounter for immunization: Secondary | ICD-10-CM

## 2014-06-17 DIAGNOSIS — IMO0002 Reserved for concepts with insufficient information to code with codable children: Secondary | ICD-10-CM

## 2014-06-17 DIAGNOSIS — IMO0001 Reserved for inherently not codable concepts without codable children: Secondary | ICD-10-CM

## 2014-06-17 DIAGNOSIS — E1165 Type 2 diabetes mellitus with hyperglycemia: Secondary | ICD-10-CM

## 2014-06-17 LAB — BASIC METABOLIC PANEL
BUN: 13 mg/dL (ref 6–23)
CO2: 27 mEq/L (ref 19–32)
Calcium: 9.6 mg/dL (ref 8.4–10.5)
Chloride: 102 mEq/L (ref 96–112)
Creat: 0.72 mg/dL (ref 0.50–1.10)
Glucose, Bld: 124 mg/dL — ABNORMAL HIGH (ref 70–99)
Potassium: 4.6 mEq/L (ref 3.5–5.3)
Sodium: 138 mEq/L (ref 135–145)

## 2014-06-17 LAB — POCT CBC
Granulocyte percent: 45.3 %G (ref 37–80)
HCT, POC: 39 % (ref 37.7–47.9)
Hemoglobin: 12 g/dL — AB (ref 12.2–16.2)
Lymph, poc: 5.9 — AB (ref 0.6–3.4)
MCH, POC: 25 pg — AB (ref 27–31.2)
MCHC: 30.9 g/dL — AB (ref 31.8–35.4)
MCV: 80.9 fL (ref 80–97)
MID (cbc): 1 — AB (ref 0–0.9)
MPV: 8.5 fL (ref 0–99.8)
POC Granulocyte: 5.7 (ref 2–6.9)
POC LYMPH PERCENT: 46.5 %L (ref 10–50)
POC MID %: 8.2 %M (ref 0–12)
Platelet Count, POC: 346 10*3/uL (ref 142–424)
RBC: 4.82 M/uL (ref 4.04–5.48)
RDW, POC: 15.1 %
WBC: 12.6 10*3/uL — AB (ref 4.6–10.2)

## 2014-06-17 LAB — POCT GLYCOSYLATED HEMOGLOBIN (HGB A1C): Hemoglobin A1C: 9.3

## 2014-06-17 LAB — LIPASE: Lipase: <10 U/L (ref 0–75)

## 2014-06-17 NOTE — Progress Notes (Addendum)
Subjective:  This chart was scribed for Diana Jordan, MD by Dellis Filbert, ED Scribe at [facility].The patient was seen in exam room 08 and the patient's care was started at 3:11 PM.   Patient ID: [name], female    DOB: 1960/11/18, 53 y.o.   MRN: [id number] Chief Complaint  Patient presents with  . Medication Refill   HPI HPI Comments: [name] is a 53 y.o. female with a history of DM who presents to [facility] her for a medication refill for metformin and onglyza. Pt states her blood sugar is not under control, she has not been exercising, eating healthy and states she has been very stressed because she has been taking care of her mother.  Pt typically sees Dr. Huey Bienenstock.  Pt has not gotten the flu shot and does want one. She has gotten the pneumovax in 2012 but has not gotten the new one.  Patient Active Problem List   Diagnosis Date Noted  . HTN (hypertension) 06/13/2012  . Diabetes mellitus type II, uncontrolled 11/14/2011  . Diabetic peripheral neuropathy associated with type 2 diabetes mellitus 11/14/2011  . Obesity, Class III, BMI 40-49.9 (morbid obesity) 11/14/2011  . Menopause 11/14/2011  . Mass on back 10/28/2011  . Pleural effusion 03/20/2011  . Cholecystitis chronic 01/24/2011  . Serous cystadenoma of pancreas 01/24/2011   Past Medical History  Diagnosis Date  . Hypertension   . Diabetes mellitus   . Numbness of fingers   . Hyperlipidemia   . Abdominal pain     pain around incision area   . Costochondritis   . Allergy   . Cancer   . GERD (gastroesophageal reflux disease)   . Anemia    Past Surgical History  Procedure Laterality Date  . Cholecystectomy    . Splenectomy, partial  01/09/2011  . Pancreatectomy  01/09/2011  . Cesarean section    . Cystectomy    . Breast biopsy      left  . Abdominal hysterectomy      partial   Allergies  Allergen Reactions  . Norvasc [Amlodipine] Nausea Only   Prior to Admission medications     Medication Sig Start Date End Date Taking? Authorizing Provider  albuterol (PROAIR HFA) 108 (90 BASE) MCG/ACT inhaler Inhale 2 puffs into the lungs every 6 (six) hours as needed. 12/31/13  Yes Posey Boyer, MD  cyclobenzaprine (FLEXERIL) 5 MG tablet Take 1 tablet (5 mg total) by mouth 3 (three) times daily as needed for muscle spasms. 02/16/13  Yes Mancel Bale, PA-C  glucose blood (ONE TOUCH ULTRA TEST) test strip Test blood sugar daily. 03/08/14  Yes Posey Boyer, MD  ibuprofen (ADVIL,MOTRIN) 600 MG tablet Take 1 tablet (600 mg total) by mouth every 6 (six) hours as needed. 02/16/13  Yes Mancel Bale, PA-C  lisinopril-hydrochlorothiazide (PRINZIDE,ZESTORETIC) 20-12.5 MG per tablet TAKE ONE TABLET BY MOUTH ONCE DAILY 06/07/14  Yes Posey Boyer, MD  metFORMIN (GLUCOPHAGE) 1000 MG tablet Take 1 tablet (1,000 mg total) by mouth 2 (two) times daily with a meal. 12/31/13  Yes Posey Boyer, MD  metoprolol succinate (TOPROL-XL) 25 MG 24 hr tablet Take 1 tablet (25 mg total) by mouth daily. 12/31/13  Yes Posey Boyer, MD  Pancrelipase, Lip-Prot-Amyl, 5000 UNITS CPEP TAKE 1 CAPSULE (5,000 UNITS TOTAL)  BY MOUTH (3) THREE TIMES DAILY BEFORE MEALS.  "NO MORE REFILLS UNTIL FOLLOW UP APPOINTMENT 05/23/14  Yes Fara Chute, PA-C  Pancrelipase, Lip-Prot-Amyl, 5000 UNITS CPEP Take 1 capsule (5,000 Units total) by mouth 3 (three) times daily. 05/31/14  Yes Mancel Bale, PA-C  pantoprazole (PROTONIX) 40 MG tablet Take 1 tablet (40 mg total) by mouth daily. 12/31/13  Yes Posey Boyer, MD  pravastatin (PRAVACHOL) 80 MG tablet Take 1 tablet (80 mg total) by mouth daily. 12/31/13  Yes Posey Boyer, MD  saxagliptin HCl (ONGLYZA) 5 MG TABS tablet Take 1 tablet (5 mg total) by mouth daily. 12/31/13  Yes Posey Boyer, MD   History   Social History  . Marital Status: Married    Spouse Name: N/A    Number of Children: N/A  . Years of Education: N/A   Occupational History  . Not on file.   Social History  Main Topics  . Smoking status: Former Smoker    Quit date: 12/19/1981  . Smokeless tobacco: Never Used  . Alcohol Use: 0.5 oz/week    1 drink(s) per week  . Drug Use: No  . Sexual Activity:    Partners: Male   Other Topics Concern  . Not on file   Social History Narrative   Review of Systems  Psychiatric/Behavioral:       Pt is stressed and has not been eating well or exercising.      Objective:  BP 164/94 mmHg  Pulse 70  Temp(Src) 98.6 F (37 C)  Resp 16  Ht 5\' 6"  (1.676 m)  Wt 264 lb (119.75 kg)  BMI 42.63 kg/m2  SpO2 99%  Physical Exam  Constitutional: She is oriented to person, place, and time. She appears well-developed and well-nourished. No distress.  HENT:  Head: Normocephalic and atraumatic.  Eyes: EOM are normal.  Neck: Normal range of motion.  Cardiovascular: Normal rate.   Pulmonary/Chest: Effort normal.  Neurological: She is alert and oriented to person, place, and time.  Skin: Skin is warm and dry.  Psychiatric: She has a normal mood and affect. Her behavior is normal.  Nursing note and vitals reviewed.  Results for orders placed or performed in visit on 06/17/14  POCT CBC  Result Value Ref Range   WBC 12.6 (A) 4.6 - 10.2 K/uL   Lymph, poc 5.9 (A) 0.6 - 3.4   POC LYMPH PERCENT 46.5 10 - 50 %L   MID (cbc) 1.0 (A) 0 - 0.9   POC MID % 8.2 0 - 12 %M   POC Granulocyte 5.7 2 - 6.9   Granulocyte percent 45.3 37 - 80 %G   RBC 4.82 4.04 - 5.48 M/uL   Hemoglobin 12.0 (A) 12.2 - 16.2 g/dL   HCT, POC 39.0 37.7 - 47.9 %   MCV 80.9 80 - 97 fL   MCH, POC 25.0 (A) 27 - 31.2 pg   MCHC 30.9 (A) 31.8 - 35.4 g/dL   RDW, POC 15.1 %   Platelet Count, POC 346 142 - 424 K/uL   MPV 8.5 0 - 99.8 fL  POCT glycosylated hemoglobin (Hb A1C)  Result Value Ref Range   Hemoglobin A1C 9.3       Assessment & Plan:  I talked to the patient and told her she needed to be more compliant with the medication. Her blood pressures up her A1c is up a  point. She needs to be  compliant with her medication. Recheck in 3 months. I personally performed the services described in this documentation, which was scribed in my presence. The recorded information has been reviewed and is accurate.

## 2014-07-17 ENCOUNTER — Other Ambulatory Visit: Payer: Self-pay | Admitting: Family Medicine

## 2014-08-11 ENCOUNTER — Encounter: Payer: PRIVATE HEALTH INSURANCE | Attending: Emergency Medicine | Admitting: *Deleted

## 2014-08-11 ENCOUNTER — Encounter: Payer: Self-pay | Admitting: *Deleted

## 2014-08-11 VITALS — Ht 66.0 in | Wt 261.9 lb

## 2014-08-11 DIAGNOSIS — IMO0002 Reserved for concepts with insufficient information to code with codable children: Secondary | ICD-10-CM

## 2014-08-11 DIAGNOSIS — E1165 Type 2 diabetes mellitus with hyperglycemia: Secondary | ICD-10-CM

## 2014-08-11 DIAGNOSIS — Z6841 Body Mass Index (BMI) 40.0 and over, adult: Secondary | ICD-10-CM | POA: Diagnosis not present

## 2014-08-11 DIAGNOSIS — Z713 Dietary counseling and surveillance: Secondary | ICD-10-CM | POA: Insufficient documentation

## 2014-08-11 DIAGNOSIS — E669 Obesity, unspecified: Secondary | ICD-10-CM | POA: Insufficient documentation

## 2014-08-11 DIAGNOSIS — E119 Type 2 diabetes mellitus without complications: Secondary | ICD-10-CM | POA: Diagnosis present

## 2014-08-11 DIAGNOSIS — Z79899 Other long term (current) drug therapy: Secondary | ICD-10-CM | POA: Insufficient documentation

## 2014-08-11 NOTE — Progress Notes (Signed)
  Medical Nutrition Therapy:  Appt start time: 1500 end time:  1630.  Assessment:  Primary concerns today: 08/11/2014. She reminded me that we met one year ago. In the meantime, her mother fell and is bed ridden, so she requires a lot of care. 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 She has done 10,000 Steps a day in the past and continues to work towards that on most days. She states she SMBG once a day in AM with reported range of 154-197, and occasionally after supper.    Preferred Learning Style:   No preference indicated   Learning Readiness:   Ready  Change in progress  MEDICATIONS: see list. Diabetes meds are Metformin and Onglyza   DIETARY INTAKE:  24-hr recall:  B ( AM): oatmeal with cranberries and raisins OR Smoothie with protein powder, greek yogurt and ice OR 6" [employer] with fresh fruit or a bag of chips, unsweetened tea with Splenda or Lemontime powder Snk ( AM): occasionally, not usually  L ( PM): left overs from restaurant meal   Snk ( PM):  D ( PM): meat, vegetable and salad, occasionally a starch Snk ( PM):  Beverages: unsweet tea with Splenda or no calorie sweetener, water  Usual physical activity: not lately due to caring for her Mom. 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  Estimated energy needs: 1400 calories 158 g carbohydrates 105 g protein 39 g fat  Progress Towards Goal(s):  In progress.   Nutritional Diagnosis:  NB-1.1 Food and nutrition-related knowledge deficit As related to Diabetes and Obesity.  As evidenced by A1c of 9.3% and BMI of 42.4    Intervention:  Nutrition counseling and diabetes education re-initiated. reviewed Carb Counting by food group as method of portion control, reading food labels, and benefits of increased activity. Also discussed basic physiology of Diabetes, target BG ranges pre and post meals, and A1c. .  Teaching Method Utilized:  Visual, Auditory and Hands on  Handouts given during visit include: Living Well with Diabetes Carb Counting and Food Label handouts Meal Plan Card Arm Chair Exercises Support Group Flyer  Barriers to learning/adherence to lifestyle change: caring for elderly parent and full time job  Demonstrated degree of understanding via:  Teach Back   Monitoring/Evaluation:  Dietary intake, exercise, reading food labels, and body weight prn.

## 2014-08-11 NOTE — Patient Instructions (Signed)
Plan:  Aim for 4 Carb Choices per meal (60 grams) +/- 1 either way  Aim for 0-2 Carbs per snack if hungry  Continue reading food labels for Total Carbohydrate of foods Continue with your activity level with Arm Chair Exercises, personal trainer, and eventually water aerobics classes Continue checking BG at alternate times per day

## 2014-08-18 ENCOUNTER — Other Ambulatory Visit: Payer: Self-pay | Admitting: Emergency Medicine

## 2014-09-02 ENCOUNTER — Other Ambulatory Visit: Payer: Self-pay

## 2014-09-02 DIAGNOSIS — Z1231 Encounter for screening mammogram for malignant neoplasm of breast: Secondary | ICD-10-CM

## 2014-09-07 ENCOUNTER — Ambulatory Visit
Admission: RE | Admit: 2014-09-07 | Discharge: 2014-09-07 | Disposition: A | Discharge status: Home / Self Care | Payer: 59 | Source: Ambulatory Visit

## 2014-09-07 DIAGNOSIS — Z1231 Encounter for screening mammogram for malignant neoplasm of breast: Secondary | ICD-10-CM

## 2014-09-19 ENCOUNTER — Other Ambulatory Visit: Payer: Self-pay | Admitting: Emergency Medicine

## 2014-10-29 ENCOUNTER — Other Ambulatory Visit: Payer: Self-pay | Admitting: Family Medicine

## 2014-10-29 ENCOUNTER — Other Ambulatory Visit: Payer: Self-pay | Admitting: Emergency Medicine

## 2014-12-21 ENCOUNTER — Ambulatory Visit (INDEPENDENT_AMBULATORY_CARE_PROVIDER_SITE_OTHER): Payer: BLUE CROSS/BLUE SHIELD | Admitting: Physician Assistant

## 2014-12-21 VITALS — BP 160/90 | HR 83 | Temp 98.3°F | Resp 16 | Ht 66.0 in | Wt 260.0 lb

## 2014-12-21 DIAGNOSIS — K219 Gastro-esophageal reflux disease without esophagitis: Secondary | ICD-10-CM | POA: Diagnosis not present

## 2014-12-21 DIAGNOSIS — E1165 Type 2 diabetes mellitus with hyperglycemia: Secondary | ICD-10-CM

## 2014-12-21 DIAGNOSIS — E1142 Type 2 diabetes mellitus with diabetic polyneuropathy: Secondary | ICD-10-CM | POA: Diagnosis not present

## 2014-12-21 DIAGNOSIS — E7800 Pure hypercholesterolemia, unspecified: Secondary | ICD-10-CM | POA: Insufficient documentation

## 2014-12-21 DIAGNOSIS — L989 Disorder of the skin and subcutaneous tissue, unspecified: Secondary | ICD-10-CM | POA: Diagnosis not present

## 2014-12-21 DIAGNOSIS — R21 Rash and other nonspecific skin eruption: Secondary | ICD-10-CM | POA: Diagnosis not present

## 2014-12-21 DIAGNOSIS — G629 Polyneuropathy, unspecified: Secondary | ICD-10-CM

## 2014-12-21 DIAGNOSIS — I10 Essential (primary) hypertension: Secondary | ICD-10-CM

## 2014-12-21 DIAGNOSIS — D279 Benign neoplasm of unspecified ovary: Secondary | ICD-10-CM

## 2014-12-21 DIAGNOSIS — IMO0002 Reserved for concepts with insufficient information to code with codable children: Secondary | ICD-10-CM

## 2014-12-21 LAB — POCT GLYCOSYLATED HEMOGLOBIN (HGB A1C): Hemoglobin A1C: 9.1

## 2014-12-21 MED ORDER — METOPROLOL SUCCINATE ER 50 MG PO TB24: 50.0000 mg | ORAL_TABLET | Freq: Every day | ORAL | Status: DC

## 2014-12-21 MED ORDER — LISINOPRIL-HYDROCHLOROTHIAZIDE 20-12.5 MG PO TABS: 1.0000 | ORAL_TABLET | Freq: Every day | ORAL | Status: DC

## 2014-12-21 MED ORDER — METFORMIN HCL 1000 MG PO TABS: 1000.0000 mg | ORAL_TABLET | Freq: Two times a day (BID) | ORAL | Status: DC

## 2014-12-21 MED ORDER — PANCRELIPASE (LIP-PROT-AMYL) 5000 UNITS PO CPEP: 1.0000 | ORAL_CAPSULE | Freq: Three times a day (TID) | ORAL | Status: DC

## 2014-12-21 MED ORDER — GLUCOSE BLOOD VI STRP: ORAL_STRIP | Status: DC

## 2014-12-21 MED ORDER — PRAVASTATIN SODIUM 80 MG PO TABS: 80.0000 mg | ORAL_TABLET | Freq: Every day | ORAL | Status: DC

## 2014-12-21 MED ORDER — TRIAMCINOLONE ACETONIDE 0.1 % EX CREA: 1.0000 "application " | TOPICAL_CREAM | Freq: Two times a day (BID) | CUTANEOUS | Status: DC

## 2014-12-21 MED ORDER — SITAGLIPTIN PHOSPHATE 100 MG PO TABS: 100.0000 mg | ORAL_TABLET | Freq: Every day | ORAL | Status: DC

## 2014-12-21 MED ORDER — PANTOPRAZOLE SODIUM 40 MG PO TBEC: 40.0000 mg | DELAYED_RELEASE_TABLET | Freq: Every day | ORAL | Status: DC

## 2014-12-21 NOTE — Progress Notes (Signed)
[name]  MRN: [id number] DOB: 12-Nov-1960  Subjective:  Pt presents to clinic for recheck and medication refills and she has a spot on her face and a rash on her arms that she would like to get checked out.  Her mom recently died of septic shock after a sore throat.  She is still really sad about that but she feels like she is dealing ok with it esp with how fast it happened. She has not been on her Onglyza for a about 2 months because it made her feel bad, she felt fine on the Januvia but she insurance stopped paying for it and now she has different insurance and she would like to try it again.  She has overall not felt well the last several weeks within increased heartburn and LUQ pain but she thought it was related to poor eating habits and her moms death.  She has a mole on her R cheek that she had not noticed but a friend pointed it out to her and she would like it checked out esp since she has had a skin cancer removed from her eye lid.  She also has a small patch of itchy skin on her right cheek that is different from the mole and a similar rash on her left arm.  She has done nothing for the rash or mole.  She still gets some sharp burning pains in her feet but her sensation is good.  Patient Active Problem List   Diagnosis Date Noted  . Pure hypercholesterolemia 12/21/2014  . HTN (hypertension) 06/13/2012  . Diabetes mellitus type II, uncontrolled 11/14/2011  . Diabetic peripheral neuropathy associated with type 2 diabetes mellitus 11/14/2011  . Obesity, Class III, BMI 40-49.9 (morbid obesity) 11/14/2011  . Menopause 11/14/2011  . Mass on back 10/28/2011  . Pleural effusion 03/20/2011  . Cholecystitis chronic 01/24/2011  . Serous cystadenoma of pancreas 01/24/2011    Current Outpatient Prescriptions on File Prior to Visit  Medication Sig Dispense Refill  . ibuprofen (ADVIL,MOTRIN) 600 MG tablet Take 1 tablet (600 mg total) by mouth every 6 (six) hours as needed. 120 tablet 3    No current facility-administered medications on file prior to visit.    Allergies  Allergen Reactions  . Norvasc [Amlodipine] Nausea Only    Review of Systems  Constitutional: Negative for fever and chills.  Eyes: Negative for visual disturbance.  Respiratory: Negative for cough.   Cardiovascular: Positive for leg swelling. Negative for chest pain and palpitations.  Gastrointestinal: Positive for abdominal pain (LUQ - similar to her pain with her pancreas problems). Negative for nausea, vomiting, diarrhea and constipation.       Increased recent heartburn  Psychiatric/Behavioral: Positive for dysphoric mood (2nd to moms recent death).   Objective:  BP 160/90 mmHg  Pulse 83  Temp(Src) 98.3 F (36.8 C) (Oral)  Resp 16  Ht 5\' 6"  (1.676 m)  Wt 260 lb (117.935 kg)  BMI 41.99 kg/m2  SpO2 98%  Physical Exam  Constitutional: She is oriented to person, place, and time and well-developed, well-nourished, and in no distress.  HENT:  Head: Normocephalic and atraumatic.  Right Ear: External ear normal.  Left Ear: External ear normal.  Eyes: Conjunctivae are normal.  Cardiovascular: Normal rate, regular rhythm and normal heart sounds.   No murmur heard. Pulmonary/Chest: Effort normal and breath sounds normal.  Musculoskeletal:  Diabetic Foot Exam - Simple   Simple Foot Form  Diabetic Foot exam was performed with the following  findings:  Yes  12/21/2014  9:14 PM  Visual Inspection  No deformities, no ulcerations, no other skin breakdown bilaterally:  Yes  Sensation Testing  Intact to touch and monofilament testing bilaterally:  Yes  Pulse Check  Posterior Tibialis and Dorsalis pulse intact bilaterally:  Yes  Comments  none   Neurological: She is alert and oriented to person, place, and time. Gait normal.  Skin: Skin is warm and dry. Rash (on face and left arm - circular patches of erythematous dry skin without signs of infection) noted.  Right cheek - shiny hyperpigmented area  with no ulcerations or surrounding erythema  Psychiatric: Mood, memory, affect and judgment normal.  Vitals reviewed.  Results for orders placed or performed in visit on 12/21/14  POCT glycosylated hemoglobin (Hb A1C)  Result Value Ref Range   Hemoglobin A1C 9.1     Assessment and Plan :  Essential hypertension - Plan: COMPLETE METABOLIC PANEL WITH GFR, lisinopril-hydrochlorothiazide (PRINZIDE,ZESTORETIC) 20-12.5 MG per tablet, metoprolol succinate (TOPROL-XL) 50 MG 24 hr tablet - we are going to increase her Toprol XL to better control her BP.  Diabetic peripheral neuropathy associated with type 2 diabetes mellitus - Plan: HM DIABETES FOOT EXAM  Diabetes mellitus type II, uncontrolled - Plan: POCT glycosylated hemoglobin (Hb A1C), HM DIABETES FOOT EXAM, sitaGLIPtin (JANUVIA) 100 MG tablet, glucose blood (ONE TOUCH ULTRA TEST) test strip, metFORMIN (GLUCOPHAGE) 1000 MG tablet - we will go back to Januvia because she is not at goal but she has also not been on her Onglyza for about 2 months and has not been eating great with her mother's illness and death.  Serous cystadenoma, unspecified laterality - Plan: Lipase, Pancrelipase, Lip-Prot-Amyl, 5000 UNITS CPEP  Gastroesophageal reflux disease without esophagitis - Plan: pantoprazole (PROTONIX) 40 MG tablet - restart her medications  Hypercholesterolemia - Plan: pravastatin (PRAVACHOL) 80 MG tablet  Skin lesion of face - Plan: Ambulatory referral to Dermatology - this is most likely a nevus but with a friend noticing it (pt has not ever noticed this mole before) I would like it evaluated esp with her h/o previous skin cancer (she does not know the type)  Skin rash - appears to be eczema or dry skin - Plan: triamcinolone cream (KENALOG) 0.1 % - use this on the face rash once a day and vaseline at night and she can use on the rash on her arms several times a day.  Windell Hummingbird PA-C  [facility] 12/21/2014 10:11 PM

## 2014-12-22 ENCOUNTER — Encounter: Payer: Self-pay | Admitting: Physician Assistant

## 2014-12-22 LAB — COMPLETE METABOLIC PANEL WITH GFR
ALT: 24 U/L (ref 0–35)
AST: 13 U/L (ref 0–37)
Albumin: 4.5 g/dL (ref 3.5–5.2)
Alkaline Phosphatase: 77 U/L (ref 39–117)
BUN: 16 mg/dL (ref 6–23)
CO2: 26 mEq/L (ref 19–32)
Calcium: 10 mg/dL (ref 8.4–10.5)
Chloride: 101 mEq/L (ref 96–112)
Creat: 0.79 mg/dL (ref 0.50–1.10)
GFR, Est African American: >89 mL/min
GFR, Est Non African American: 86 mL/min
Glucose, Bld: 310 mg/dL — ABNORMAL HIGH (ref 70–99)
Potassium: 3.8 mEq/L (ref 3.5–5.3)
Sodium: 137 mEq/L (ref 135–145)
Total Bilirubin: 0.4 mg/dL (ref 0.2–1.2)
Total Protein: 7.3 g/dL (ref 6.0–8.3)

## 2014-12-22 LAB — LIPASE: Lipase: 17 U/L (ref 0–75)

## 2014-12-22 LAB — LIPID PANEL
Cholesterol: 184 mg/dL (ref 0–200)
HDL: 54 mg/dL (ref >=46)
LDL Cholesterol: 115 mg/dL — ABNORMAL HIGH (ref 0–99)
Total CHOL/HDL Ratio: 3.4 Ratio
Triglycerides: 76 mg/dL (ref <150)
VLDL: 15 mg/dL (ref 0–40)

## 2014-12-23 ENCOUNTER — Encounter: Payer: Self-pay | Admitting: Physician Assistant

## 2015-01-27 ENCOUNTER — Ambulatory Visit (INDEPENDENT_AMBULATORY_CARE_PROVIDER_SITE_OTHER): Payer: BLUE CROSS/BLUE SHIELD | Admitting: Emergency Medicine

## 2015-01-27 VITALS — BP 168/94 | HR 71 | Temp 98.5°F | Resp 14 | Ht 66.5 in | Wt 263.4 lb

## 2015-01-27 DIAGNOSIS — H109 Unspecified conjunctivitis: Secondary | ICD-10-CM | POA: Diagnosis not present

## 2015-01-27 MED ORDER — POLYMYXIN B-TRIMETHOPRIM 10000-0.1 UNIT/ML-% OP SOLN: 2.0000 [drp] | OPHTHALMIC | Status: DC

## 2015-01-27 NOTE — Progress Notes (Signed)
Subjective:  Patient ID: [name], female    DOB: May 21, 1961  Age: 54 y.o. MRN: [id number]  CC: Conjunctivitis   HPI [name] presents  week long history of red right eye. He said that the eye is glued shut in the mornings. She has no visual complaints. No cough, runny nose fever or chills. No ill contacts. She has no pain She does not wear contact lenses. She has no fever or chills. No discomfort. She noted that at times her left eye as well.  History Boni has a past medical history of Hypertension; Diabetes mellitus; Numbness of fingers; Hyperlipidemia; Abdominal pain; Costochondritis; Allergy; Cancer; GERD (gastroesophageal reflux disease); and Anemia.   She has past surgical history that includes Cholecystectomy; Splenectomy, partial (01/09/2011); Pancreatectomy (01/09/2011); Cesarean section; Cystectomy; Breast biopsy; and Abdominal hysterectomy.   Her  family history includes Cancer (age of onset: 67) in her mother; Diabetes in her mother; Heart disease in her father; Stroke (age of onset: 26) in her mother.  She   reports that she quit smoking about 33 years ago. She has never used smokeless tobacco. She reports that she drinks about 0.5 oz of alcohol per week. She reports that she does not use illicit drugs.  Outpatient Prescriptions Prior to Visit  Medication Sig Dispense Refill  . glucose blood (ONE TOUCH ULTRA TEST) test strip Test blood sugar daily. 100 each 2  . ibuprofen (ADVIL,MOTRIN) 600 MG tablet Take 1 tablet (600 mg total) by mouth every 6 (six) hours as needed. 120 tablet 3  . lisinopril-hydrochlorothiazide (PRINZIDE,ZESTORETIC) 20-12.5 MG per tablet Take 1 tablet by mouth daily. 90 tablet 0  . metFORMIN (GLUCOPHAGE) 1000 MG tablet Take 1 tablet (1,000 mg total) by mouth 2 (two) times daily with a meal. 180 tablet 3  . metoprolol succinate (TOPROL-XL) 50 MG 24 hr tablet Take 1 tablet (50 mg total) by mouth daily. 90 tablet 0  . Pancrelipase, Lip-Prot-Amyl,  5000 UNITS CPEP Take 1 capsule (5,000 Units total) by mouth 3 (three) times daily. 90 capsule 4  . pantoprazole (PROTONIX) 40 MG tablet Take 1 tablet (40 mg total) by mouth daily. 90 tablet 4  . sitaGLIPtin (JANUVIA) 100 MG tablet Take 1 tablet (100 mg total) by mouth daily. 90 tablet 0  . triamcinolone cream (KENALOG) 0.1 % Apply 1 application topically 2 (two) times daily. 30 g 0  . pravastatin (PRAVACHOL) 80 MG tablet Take 1 tablet (80 mg total) by mouth daily. (Patient not taking: Reported on 01/27/2015) 90 tablet 0   No facility-administered medications prior to visit.    History   Social History  . Marital Status: Married    Spouse Name: N/A  . Number of Children: N/A  . Years of Education: N/A   Social History Main Topics  . Smoking status: Former Smoker    Quit date: 12/19/1981  . Smokeless tobacco: Never Used  . Alcohol Use: 0.5 oz/week    1 drink(s) per week  . Drug Use: No  . Sexual Activity:    Partners: Male   Other Topics Concern  . None   Social History Narrative     Review of Systems  Constitutional: Negative for fever, chills and appetite change.  HENT: Negative for congestion, ear pain, postnasal drip, sinus pressure and sore throat.   Eyes: Positive for discharge and redness. Negative for pain.  Respiratory: Negative for cough, shortness of breath and wheezing.   Cardiovascular: Negative for leg swelling.  Gastrointestinal: Negative for nausea,  vomiting, abdominal pain, diarrhea, constipation and blood in stool.  Endocrine: Negative for polyuria.  Genitourinary: Negative for dysuria, urgency, frequency and flank pain.  Musculoskeletal: Negative for gait problem.  Skin: Negative for rash.  Neurological: Negative for weakness and headaches.  Psychiatric/Behavioral: Negative for confusion and decreased concentration. The patient is not nervous/anxious.     Objective:  BP 168/94 mmHg  Pulse 71  Temp(Src) 98.5 F (36.9 C) (Oral)  Resp 14  Ht 5' 6.5"  (1.689 m)  Wt 263 lb 6.4 oz (119.477 kg)  BMI 41.88 kg/m2  SpO2 97%  Physical Exam  Constitutional: She is oriented to person, place, and time. She appears well-developed and well-nourished.  HENT:  Head: Normocephalic and atraumatic.  Eyes: EOM are normal. Pupils are equal, round, and reactive to light. Right eye exhibits no discharge. Left eye exhibits discharge. No foreign body present in the left eye. Left conjunctiva is injected. Left conjunctiva has no hemorrhage.  Pulmonary/Chest: Effort normal.  Musculoskeletal: She exhibits no edema.  Neurological: She is alert and oriented to person, place, and time.  Skin: Skin is dry.  Psychiatric: She has a normal mood and affect. Her behavior is normal. Thought content normal.      Assessment & Plan:   [name] was seen today for conjunctivitis.  Diagnoses and all orders for this visit:  Bilateral conjunctivitis  Other orders -     trimethoprim-polymyxin b (POLYTRIM) ophthalmic solution; Place 2 drops into both eyes every 4 (four) hours.   I am having [name] start on trimethoprim-polymyxin b. I am also having her maintain her ibuprofen, sitaGLIPtin, glucose blood, lisinopril-hydrochlorothiazide, metFORMIN, Pancrelipase (Lip-Prot-Amyl), pantoprazole, pravastatin, metoprolol succinate, and triamcinolone cream.  Meds ordered this encounter  Medications  . trimethoprim-polymyxin b (POLYTRIM) ophthalmic solution    Sig: Place 2 drops into both eyes every 4 (four) hours.    Dispense:  10 mL    Refill:  0    Appropriate red flag conditions were discussed with the patient as well as actions that should be taken.  Patient expressed his understanding.  Follow-up: Return if symptoms worsen or fail to improve.  Roselee Culver, MD

## 2015-01-27 NOTE — Patient Instructions (Signed)

## 2015-03-30 ENCOUNTER — Ambulatory Visit (INDEPENDENT_AMBULATORY_CARE_PROVIDER_SITE_OTHER): Payer: BLUE CROSS/BLUE SHIELD

## 2015-03-30 ENCOUNTER — Ambulatory Visit (INDEPENDENT_AMBULATORY_CARE_PROVIDER_SITE_OTHER): Payer: BLUE CROSS/BLUE SHIELD | Admitting: Physician Assistant

## 2015-03-30 VITALS — BP 146/88 | HR 82 | Temp 99.1°F | Resp 18 | Ht 66.5 in | Wt 261.0 lb

## 2015-03-30 DIAGNOSIS — J189 Pneumonia, unspecified organism: Secondary | ICD-10-CM

## 2015-03-30 DIAGNOSIS — E7800 Pure hypercholesterolemia, unspecified: Secondary | ICD-10-CM

## 2015-03-30 DIAGNOSIS — R079 Chest pain, unspecified: Secondary | ICD-10-CM

## 2015-03-30 DIAGNOSIS — D279 Benign neoplasm of unspecified ovary: Secondary | ICD-10-CM | POA: Diagnosis not present

## 2015-03-30 DIAGNOSIS — IMO0002 Reserved for concepts with insufficient information to code with codable children: Secondary | ICD-10-CM

## 2015-03-30 DIAGNOSIS — K219 Gastro-esophageal reflux disease without esophagitis: Secondary | ICD-10-CM

## 2015-03-30 DIAGNOSIS — E1165 Type 2 diabetes mellitus with hyperglycemia: Secondary | ICD-10-CM

## 2015-03-30 DIAGNOSIS — I10 Essential (primary) hypertension: Secondary | ICD-10-CM

## 2015-03-30 LAB — POCT CBC
Granulocyte percent: 60.6 %G (ref 37–80)
HCT, POC: 43 % (ref 37.7–47.9)
Hemoglobin: 12.7 g/dL (ref 12.2–16.2)
Lymph, poc: 3.9 — AB (ref 0.6–3.4)
MCH, POC: 23.6 pg — AB (ref 27–31.2)
MCHC: 29.6 g/dL — AB (ref 31.8–35.4)
MCV: 79.5 fL — AB (ref 80–97)
MID (cbc): 1.2 — AB (ref 0–0.9)
MPV: 9.1 fL (ref 0–99.8)
POC Granulocyte: 7.9 — AB (ref 2–6.9)
POC LYMPH PERCENT: 30.1 %L (ref 10–50)
POC MID %: 9.3 %M (ref 0–12)
Platelet Count, POC: 395 10*3/uL (ref 142–424)
RBC: 5.41 M/uL (ref 4.04–5.48)
RDW, POC: 15.8 %
WBC: 13.1 10*3/uL — AB (ref 4.6–10.2)

## 2015-03-30 LAB — GLUCOSE, POCT (MANUAL RESULT ENTRY): POC Glucose: 152 mg/dl — AB (ref 70–99)

## 2015-03-30 LAB — POCT GLYCOSYLATED HEMOGLOBIN (HGB A1C): Hemoglobin A1C: 8.7

## 2015-03-30 MED ORDER — PANTOPRAZOLE SODIUM 40 MG PO TBEC: 40.0000 mg | DELAYED_RELEASE_TABLET | Freq: Every day | ORAL | Status: DC

## 2015-03-30 MED ORDER — LEVOFLOXACIN 750 MG PO TABS: 750.0000 mg | ORAL_TABLET | Freq: Every day | ORAL | Status: DC

## 2015-03-30 MED ORDER — METFORMIN HCL 1000 MG PO TABS: 1000.0000 mg | ORAL_TABLET | Freq: Two times a day (BID) | ORAL | Status: DC

## 2015-03-30 MED ORDER — PRAVASTATIN SODIUM 80 MG PO TABS: 80.0000 mg | ORAL_TABLET | Freq: Every day | ORAL | Status: DC

## 2015-03-30 MED ORDER — LISINOPRIL-HYDROCHLOROTHIAZIDE 20-12.5 MG PO TABS: 1.0000 | ORAL_TABLET | Freq: Every day | ORAL | Status: DC

## 2015-03-30 MED ORDER — SITAGLIPTIN PHOSPHATE 100 MG PO TABS: 100.0000 mg | ORAL_TABLET | Freq: Every day | ORAL | Status: DC

## 2015-03-30 MED ORDER — PANCRELIPASE (LIP-PROT-AMYL) 5000 UNITS PO CPEP: 1.0000 | ORAL_CAPSULE | Freq: Three times a day (TID) | ORAL | Status: DC

## 2015-03-30 MED ORDER — METOPROLOL SUCCINATE ER 50 MG PO TB24: 50.0000 mg | ORAL_TABLET | Freq: Every day | ORAL | Status: DC

## 2015-03-30 NOTE — Patient Instructions (Signed)

## 2015-03-30 NOTE — Progress Notes (Signed)
Subjective:    Patient ID: [name], female    DOB: 1961/05/06, 54 y.o.   MRN: [id number]  HPI Patient presents for med refill and SOB and CP.  States that she has not been feeling well over the past 3 days and has had a productive cough, congestion, felt hot, HA and had fatigue. However, today having chest pain in the center of chest that radiates up to neck and endorses SOB at rest and DOE with daily activities. Using 2 pillows to sleep. Denies edema, palpitations, sweating, or weakness, but adds she has felt nauseous. H/o HTN, pericarditis, and DM. Denies MI or CHF.   States that she has been compliant with all medications and denies side effects. States that she has been working on her diet and has noticed that potatos and potato products tend to make her glucose spike more than other foods. Was walking for exercise up until she started to feel poorly 3 days ago. Says that she could barely breath when she walked. Fasting glucose has been in the 150s, however, over past 2 days has been 180s.  Med allergy: Norvasc.  Review of Systems As noted above.    Objective:   Physical Exam  Constitutional: She is oriented to person, place, and time. She appears well-developed and well-nourished. No distress.  Blood pressure 146/88, pulse 82, temperature 99.1 F (37.3 C), temperature source Oral, resp. rate 18, height 5' 6.5" (1.689 m), weight 261 lb (118.389 kg), SpO2 97 %.  HENT:  Head: Normocephalic and atraumatic.  Right Ear: Tympanic membrane, external ear and ear canal normal.  Left Ear: Tympanic membrane, external ear and ear canal normal.  Nose: Rhinorrhea (with erythema) present. Right sinus exhibits no maxillary sinus tenderness and no frontal sinus tenderness. Left sinus exhibits no maxillary sinus tenderness and no frontal sinus tenderness.  Mouth/Throat: Uvula is midline, oropharynx is clear and moist and mucous membranes are normal. No oropharyngeal exudate, posterior  oropharyngeal edema or posterior oropharyngeal erythema.  Eyes: Conjunctivae are normal. Pupils are equal, round, and reactive to light. Right eye exhibits no discharge. Left eye exhibits no discharge. No scleral icterus.  Neck: Normal range of motion. Neck supple. No thyromegaly present.  Cardiovascular: Normal rate, regular rhythm and normal heart sounds.  Exam reveals no gallop and no friction rub.   No murmur heard. Pulmonary/Chest: Effort normal. No respiratory distress. She has no decreased breath sounds. She has no wheezes. She has no rhonchi. She has rales in the right middle field. She exhibits no tenderness.  Abdominal: Soft. Bowel sounds are normal. She exhibits no distension. There is no tenderness. There is no rebound and no guarding.  Lymphadenopathy:    She has no cervical adenopathy.  Neurological: She is alert and oriented to person, place, and time.  Skin: Skin is warm and dry. No rash noted. She is not diaphoretic. No erythema.   EKG read by Dr. Carlota Raspberry: Non-specific ST in V1& 2 and flattening of T in lead III. No acute findings  [facility] reading (PRIMARY) by  Dr. Carlota Raspberry. Cardiomegaly. Increased markings bilaterally. Possible right lower lobe infiltrate.  Results for orders placed or performed in visit on 03/30/15  POCT glycosylated hemoglobin (Hb A1C)  Result Value Ref Range   Hemoglobin A1C 8.7   POCT glucose (manual entry)  Result Value Ref Range   POC Glucose 152 (A) 70 - 99 mg/dl  POCT CBC  Result Value Ref Range   WBC 13.1 (A) 4.6 - 10.2 K/uL  Lymph, poc 3.9 (A) 0.6 - 3.4   POC LYMPH PERCENT 30.1 10 - 50 %L   MID (cbc) 1.2 (A) 0 - 0.9   POC MID % 9.3 0 - 12 %M   POC Granulocyte 7.9 (A) 2 - 6.9   Granulocyte percent 60.6 37 - 80 %G   RBC 5.41 4.04 - 5.48 M/uL   Hemoglobin 12.7 12.2 - 16.2 g/dL   HCT, POC 43.0 37.7 - 47.9 %   MCV 79.5 (A) 80 - 97 fL   MCH, POC 23.6 (A) 27 - 31.2 pg   MCHC 29.6 (A) 31.8 - 35.4 g/dL   RDW, POC 15.8 %   Platelet Count, POC 395.0  142 - 424 K/uL   MPV 9.1 0 - 99.8 fL       Assessment & Plan:  1. CAP (community acquired pneumonia) RTC 04/01/15 for recheck. - levofloxacin (LEVAQUIN) 750 MG tablet; Take 1 tablet (750 mg total) by mouth daily.  Dispense: 5 tablet; Refill: 0  2. Essential hypertension - lisinopril-hydrochlorothiazide (PRINZIDE,ZESTORETIC) 20-12.5 MG per tablet; Take 1 tablet by mouth daily.  Dispense: 90 tablet; Refill: 0 - metoprolol succinate (TOPROL-XL) 50 MG 24 hr tablet; Take 1 tablet (50 mg total) by mouth daily.  Dispense: 90 tablet; Refill: 0 - Brain natriuretic peptide  3. Diabetes mellitus type II, uncontrolled - POCT glycosylated hemoglobin (Hb A1C) - POCT glucose (manual entry) - metFORMIN (GLUCOPHAGE) 1000 MG tablet; Take 1 tablet (1,000 mg total) by mouth 2 (two) times daily with a meal.  Dispense: 180 tablet; Refill: 3 - sitaGLIPtin (JANUVIA) 100 MG tablet; Take 1 tablet (100 mg total) by mouth daily.  Dispense: 90 tablet; Refill: 0  4. Chest pain, unspecified chest pain type - DG Chest 2 View; Future - EKG 12-Lead - POCT CBC  5. Serous cystadenoma, unspecified laterality - Pancrelipase, Lip-Prot-Amyl, 5000 UNITS CPEP; Take 1 capsule (5,000 Units total) by mouth 3 (three) times daily.  Dispense: 90 capsule; Refill: 4  6. Gastroesophageal reflux disease without esophagitis - pantoprazole (PROTONIX) 40 MG tablet; Take 1 tablet (40 mg total) by mouth daily.  Dispense: 90 tablet; Refill: 4  7. Hypercholesterolemia - pravastatin (PRAVACHOL) 80 MG tablet; Take 1 tablet (80 mg total) by mouth daily.  Dispense: 90 tablet; Refill: 0   Dashon Mcintire PA-C  [facility] 03/30/2015 5:32 PM

## 2015-03-31 ENCOUNTER — Telehealth: Payer: Self-pay

## 2015-03-31 LAB — BRAIN NATRIURETIC PEPTIDE: Brain Natriuretic Peptide: 2.7 pg/mL (ref 0.0–100.0)

## 2015-03-31 MED ORDER — PANCRELIPASE (LIP-PROT-AMYL) 5000 UNITS PO CPEP: 1.0000 | ORAL_CAPSULE | Freq: Three times a day (TID) | ORAL | Status: DC

## 2015-03-31 NOTE — Telephone Encounter (Signed)
Pharm faxed report that pancrelipase is no longer available, they now have to dispense name brand Zenpep. I will change in system so correct.

## 2015-04-01 ENCOUNTER — Ambulatory Visit (INDEPENDENT_AMBULATORY_CARE_PROVIDER_SITE_OTHER): Payer: BLUE CROSS/BLUE SHIELD | Admitting: Physician Assistant

## 2015-04-01 VITALS — BP 144/94 | HR 82 | Temp 97.7°F | Resp 18 | Ht 66.5 in | Wt 261.4 lb

## 2015-04-01 DIAGNOSIS — R05 Cough: Secondary | ICD-10-CM | POA: Diagnosis not present

## 2015-04-01 DIAGNOSIS — J189 Pneumonia, unspecified organism: Secondary | ICD-10-CM | POA: Diagnosis not present

## 2015-04-01 LAB — POCT CBC
Granulocyte percent: 60.2 %G (ref 37–80)
HCT, POC: 41.2 % (ref 37.7–47.9)
Hemoglobin: 12.3 g/dL (ref 12.2–16.2)
Lymph, poc: 4.1 — AB (ref 0.6–3.4)
MCH, POC: 23.6 pg — AB (ref 27–31.2)
MCHC: 29.9 g/dL — AB (ref 31.8–35.4)
MCV: 78.8 fL — AB (ref 80–97)
MID (cbc): 0.7 (ref 0–0.9)
MPV: 8.2 fL (ref 0–99.8)
POC Granulocyte: 7.3 — AB (ref 2–6.9)
POC LYMPH PERCENT: 33.7 %L (ref 10–50)
POC MID %: 6.1 %M (ref 0–12)
Platelet Count, POC: 372 10*3/uL (ref 142–424)
RBC: 5.22 M/uL (ref 4.04–5.48)
RDW, POC: 14.6 %
WBC: 12.1 10*3/uL — AB (ref 4.6–10.2)

## 2015-04-01 MED ORDER — HYDROCOD POLST-CPM POLST ER 10-8 MG/5ML PO SUER: 5.0000 mL | Freq: Every evening | ORAL | Status: DC | PRN

## 2015-04-01 MED ORDER — CEFTRIAXONE SODIUM 1 G IJ SOLR
1.0000 g | Freq: Once | INTRAMUSCULAR | Status: AC
  Administered 2015-04-01: 1 g via INTRAMUSCULAR

## 2015-04-01 MED ORDER — BENZONATATE 100 MG PO CAPS: 100.0000 mg | ORAL_CAPSULE | Freq: Three times a day (TID) | ORAL | Status: DC | PRN

## 2015-04-01 NOTE — Progress Notes (Signed)
Subjective:    Patient ID: [name], female    DOB: November 05, 1960, 54 y.o.   MRN: [id number]  HPI Patient present for pneumonia follow up and states that she feels a little better. Cough is more productive now and is having coughing spells. States that cough is keeping her up at night and is so sleepy. No longer having chest pain. Endorses congestion and rhinorrhea that are the same as previous. Denies fever, N/V, HA, dizziness. Taking the Levaquin and has had isolated episode of nausea that passed. Med allergy: Norvasc.   Review of Systems  Constitutional: Positive for fatigue. Negative for fever, chills, diaphoresis and appetite change.  HENT: Positive for congestion and rhinorrhea. Negative for ear discharge, ear pain, postnasal drip, sinus pressure, sneezing and sore throat.   Respiratory: Positive for cough. Negative for shortness of breath and wheezing.   Cardiovascular: Negative.  Negative for chest pain, palpitations and leg swelling.  Gastrointestinal: Negative.   Neurological: Negative for dizziness and headaches.       Objective:   Physical Exam  Constitutional: She is oriented to person, place, and time. She appears well-developed and well-nourished. No distress.  Blood pressure 144/94, pulse 82, temperature 97.7 F (36.5 C), temperature source Oral, resp. rate 18, height 5' 6.5" (1.689 m), weight 261 lb 6.4 oz (118.57 kg), SpO2 97 %.   HENT:  Head: Normocephalic and atraumatic.  Right Ear: Tympanic membrane, external ear and ear canal normal.  Left Ear: Tympanic membrane, external ear and ear canal normal.  Nose: Rhinorrhea (with erythema) present. Right sinus exhibits no maxillary sinus tenderness and no frontal sinus tenderness. Left sinus exhibits no maxillary sinus tenderness and no frontal sinus tenderness.  Mouth/Throat: Uvula is midline, oropharynx is clear and moist and mucous membranes are normal. No oropharyngeal exudate, posterior oropharyngeal edema or  posterior oropharyngeal erythema.  Eyes: Conjunctivae are normal. Pupils are equal, round, and reactive to light. Right eye exhibits no discharge. Left eye exhibits no discharge. No scleral icterus.  Neck: Normal range of motion. Neck supple. No thyromegaly present.  Cardiovascular: Normal rate, regular rhythm and normal heart sounds.  Exam reveals no gallop and no friction rub.   No murmur heard. Pulmonary/Chest: Effort normal. No respiratory distress. She has no decreased breath sounds. She has wheezes (improvement from 2 days ago. previously was more of a distant rales) in the right middle field. She has no rhonchi. She has no rales. She exhibits no tenderness.  Abdominal: Soft. Bowel sounds are normal. She exhibits no distension. There is no tenderness. There is no rebound and no guarding.  Lymphadenopathy:    She has no cervical adenopathy.  Neurological: She is alert and oriented to person, place, and time.  Skin: Skin is warm and dry. No rash noted. She is not diaphoretic. No erythema.   CBC Latest Ref Rng 04/01/2015 03/30/2015 06/17/2014  WBC 4.6 - 10.2 K/uL 12.1(A) 13.1(A) 12.6(A)  Hemoglobin 12.2 - 16.2 g/dL 12.3 12.7 12.0(A)  Hematocrit 37.7 - 47.9 % 41.2 43.0 39.0  Platelets 150 - 400 K/uL - - -      Assessment & Plan:  1. CAP (community acquired pneumonia) Continue Levaquin. RTC for recheck 04/03/15.  - POCT CBC - cefTRIAXone (ROCEPHIN) injection 1 g; Inject 1 g into the muscle once. - benzonatate (TESSALON) 100 MG capsule; Take 1-2 capsules (100-200 mg total) by mouth 3 (three) times daily as needed for cough.  Dispense: 40 capsule; Refill: 0 - chlorpheniramine-HYDROcodone (TUSSIONEX PENNKINETIC ER) 10-8 MG/5ML  SUER; Take 5 mLs by mouth at bedtime as needed for cough.  Dispense: 75 mL; Refill: 0   Sarahmarie Leavey PA-C  [facility] 04/01/2015 3:51 PM

## 2015-04-03 ENCOUNTER — Encounter: Payer: Self-pay | Admitting: Physician Assistant

## 2015-04-03 ENCOUNTER — Ambulatory Visit (INDEPENDENT_AMBULATORY_CARE_PROVIDER_SITE_OTHER): Payer: BLUE CROSS/BLUE SHIELD

## 2015-04-03 ENCOUNTER — Ambulatory Visit (INDEPENDENT_AMBULATORY_CARE_PROVIDER_SITE_OTHER): Payer: BLUE CROSS/BLUE SHIELD | Admitting: Family Medicine

## 2015-04-03 VITALS — BP 159/98 | HR 82 | Temp 98.5°F | Resp 16 | Wt 257.0 lb

## 2015-04-03 DIAGNOSIS — J189 Pneumonia, unspecified organism: Secondary | ICD-10-CM

## 2015-04-03 DIAGNOSIS — I10 Essential (primary) hypertension: Secondary | ICD-10-CM | POA: Diagnosis not present

## 2015-04-03 LAB — POCT CBC
Granulocyte percent: 50.7 %G (ref 37–80)
HCT, POC: 42.3 % (ref 37.7–47.9)
Hemoglobin: 12.7 g/dL (ref 12.2–16.2)
Lymph, poc: 4.7 — AB (ref 0.6–3.4)
MCH, POC: 23.4 pg — AB (ref 27–31.2)
MCHC: 30.1 g/dL — AB (ref 31.8–35.4)
MCV: 77.8 fL — AB (ref 80–97)
MID (cbc): 0.7 (ref 0–0.9)
MPV: 8.5 fL (ref 0–99.8)
POC Granulocyte: 5.6 (ref 2–6.9)
POC LYMPH PERCENT: 42.8 %L (ref 10–50)
POC MID %: 6.5 %M (ref 0–12)
Platelet Count, POC: 418 10*3/uL (ref 142–424)
RBC: 5.44 M/uL (ref 4.04–5.48)
RDW, POC: 14.6 %
WBC: 11 10*3/uL — AB (ref 4.6–10.2)

## 2015-04-03 NOTE — Progress Notes (Signed)
Subjective:    Patient ID: [name], female    DOB: [birth year], 54 y.o.   MRN: [id number]  HPI Patient presents for pneumonia follow up. States that she is starting to feel better today. Cough is productive and feels like it is moving better today. No longer having any SOB, however, DOE present if she overdoes it. Is move comfortable when she is laying down now, however, still not sleeping like usual. Endorse rhinorrhea, nasal congestion, and fatigue today. Denies weakness, CP, fever, chills, N/V, HA/dizziness. Finished Levaquin today and denies side effects. Tessalon works better for cough. Med allergy: Norvasc.    Review of Systems As noted above.    Objective:   Physical Exam  Constitutional: She is oriented to person, place, and time. She appears well-developed and well-nourished. No distress.  Blood pressure 159/98, pulse 82, temperature 98.5 F (36.9 C), temperature source Oral, resp. rate 16, weight 257 lb (116.574 kg).   HENT:  Head: Normocephalic and atraumatic.  Right Ear: External ear normal.  Left Ear: External ear normal.  Eyes: Conjunctivae are normal. Right eye exhibits no discharge. Left eye exhibits no discharge. No scleral icterus.  Neck: Neck supple.  Cardiovascular: Normal rate, regular rhythm, normal heart sounds and intact distal pulses.  Exam reveals no gallop and no friction rub.   No murmur heard. Pulmonary/Chest: Effort normal. No respiratory distress. She has no wheezes. She has rales in the right middle field. She exhibits no tenderness.  Although, rales present, not as bad as when seen 4 days ago  Lymphadenopathy:    She has no cervical adenopathy.  Neurological: She is alert and oriented to person, place, and time.  Skin: Skin is warm and dry. No rash noted. She is not diaphoretic. No erythema. No pallor.  Psychiatric: She has a normal mood and affect. Her behavior is normal. Judgment and thought content normal.   Results for orders placed or  performed in visit on 04/03/15  POCT CBC  Result Value Ref Range   WBC 11.0 (A) 4.6 - 10.2 K/uL   Lymph, poc 4.7 (A) 0.6 - 3.4   POC LYMPH PERCENT 42.8 10 - 50 %L   MID (cbc) 0.7 0 - 0.9   POC MID % 6.5 0 - 12 %M   POC Granulocyte 5.6 2 - 6.9   Granulocyte percent 50.7 37 - 80 %G   RBC 5.44 4.04 - 5.48 M/uL   Hemoglobin 12.7 12.2 - 16.2 g/dL   HCT, POC 42.3 37.7 - 47.9 %   MCV 77.8 (A) 80 - 97 fL   MCH, POC 23.4 (A) 27 - 31.2 pg   MCHC 30.1 (A) 31.8 - 35.4 g/dL   RDW, POC 14.6 %   Platelet Count, POC 418.0 142 - 424 K/uL   MPV 8.5 0 - 99.8 fL   CBC Latest Ref Rng 04/03/2015 04/01/2015 03/30/2015  WBC 4.6 - 10.2 K/uL 11.0(A) 12.1(A) 13.1(A)  Hemoglobin 12.2 - 16.2 g/dL 12.7 12.3 12.7  Hematocrit 37.7 - 47.9 % 42.3 41.2 43.0  Platelets 150 - 400 K/uL - - -   [facility] reading (PRIMARY) by  Dr. Tamala Julian. Cardiomegaly. Lower right lobe infiltrate improved from previous CXR 03/30/15.     Assessment & Plan:  1. CAP (community acquired pneumonia) Both labs and CXR improved. Patient feeling better. May continue Tussionex and tessalon as needed. Anticipatory guidance and warning signs discussed.  - POCT CBC - DG Chest 2 View; Future  2. Essential hypertension Advised to take  BP medications. When feeling better should resume walking and eating.  Alveta Heimlich PA-C  [facility] 04/03/2015 1:40 PM

## 2015-04-06 ENCOUNTER — Telehealth: Payer: Self-pay

## 2015-04-06 MED ORDER — ALBUTEROL SULFATE HFA 108 (90 BASE) MCG/ACT IN AERS: 2.0000 | INHALATION_SPRAY | Freq: Four times a day (QID) | RESPIRATORY_TRACT | Status: DC | PRN

## 2015-04-06 NOTE — Telephone Encounter (Signed)
Spoke with patient. Inhaler refilled. SOB when talking for extended time.

## 2015-04-06 NOTE — Telephone Encounter (Signed)
Pt would like an inhaler called into the pharmacy. Pt states she does not know if she over-exerted herself but she feels a little SOB. Please advise.

## 2015-05-04 ENCOUNTER — Other Ambulatory Visit: Payer: Self-pay | Admitting: Physician Assistant

## 2015-05-07 NOTE — Progress Notes (Signed)
History and physical examinations reviewed in detail with PA [place].  CXR reviewed during visit.  Agree with assessment and plan. Gaines Cartmell Elayne Guerin, M.D. [facility] [address] [place], [place]  [id number] [phone] phone [phone] fax

## 2015-07-04 ENCOUNTER — Other Ambulatory Visit: Payer: Self-pay | Admitting: *Deleted

## 2015-07-04 ENCOUNTER — Other Ambulatory Visit: Payer: Self-pay | Admitting: Physician Assistant

## 2015-07-04 DIAGNOSIS — I10 Essential (primary) hypertension: Secondary | ICD-10-CM

## 2015-07-04 MED ORDER — LISINOPRIL-HYDROCHLOROTHIAZIDE 20-12.5 MG PO TABS: 1.0000 | ORAL_TABLET | Freq: Every day | ORAL | Status: DC

## 2015-07-04 NOTE — Telephone Encounter (Signed)
Needs office visit.

## 2015-07-07 ENCOUNTER — Ambulatory Visit (INDEPENDENT_AMBULATORY_CARE_PROVIDER_SITE_OTHER): Payer: BLUE CROSS/BLUE SHIELD | Admitting: Physician Assistant

## 2015-07-07 VITALS — BP 142/102 | HR 78 | Temp 98.0°F | Resp 18 | Ht 66.0 in | Wt 274.6 lb

## 2015-07-07 DIAGNOSIS — R319 Hematuria, unspecified: Secondary | ICD-10-CM | POA: Diagnosis not present

## 2015-07-07 DIAGNOSIS — I10 Essential (primary) hypertension: Secondary | ICD-10-CM

## 2015-07-07 DIAGNOSIS — R300 Dysuria: Secondary | ICD-10-CM

## 2015-07-07 DIAGNOSIS — E7800 Pure hypercholesterolemia, unspecified: Secondary | ICD-10-CM

## 2015-07-07 DIAGNOSIS — N3001 Acute cystitis with hematuria: Secondary | ICD-10-CM

## 2015-07-07 LAB — POCT URINALYSIS DIP (MANUAL ENTRY)
Bilirubin, UA: NEGATIVE
Glucose, UA: NEGATIVE
Ketones, POC UA: NEGATIVE
Nitrite, UA: POSITIVE — AB
Protein Ur, POC: 30 — AB
Spec Grav, UA: 1.015
Urobilinogen, UA: 0.2
pH, UA: 5.5

## 2015-07-07 LAB — POC MICROSCOPIC URINALYSIS (UMFC): Mucus: ABSENT

## 2015-07-07 MED ORDER — PHENAZOPYRIDINE HCL 200 MG PO TABS: 200.0000 mg | ORAL_TABLET | Freq: Three times a day (TID) | ORAL | Status: DC | PRN

## 2015-07-07 MED ORDER — METOPROLOL SUCCINATE ER 100 MG PO TB24: 100.0000 mg | ORAL_TABLET | Freq: Every day | ORAL | Status: DC

## 2015-07-07 MED ORDER — NITROFURANTOIN MONOHYD MACRO 100 MG PO CAPS: 100.0000 mg | ORAL_CAPSULE | Freq: Two times a day (BID) | ORAL | Status: DC

## 2015-07-07 MED ORDER — ROSUVASTATIN CALCIUM 10 MG PO TABS: 10.0000 mg | ORAL_TABLET | Freq: Every day | ORAL | Status: DC

## 2015-07-07 NOTE — Progress Notes (Signed)
[name]  MRN: [id number] DOB: 24-Jan-1961  Subjective:  Pt presents to clinic with UTI symptoms that started last night and seem to be getting worse as the day goes on.  She is having some nausea but she has not eaten today.  She is having some left sided back pain and suprapubic pressure. She is having no F/C. She has tried nothing to help with her symptoms.  Her glucose has been elevated recently. She is taking her medication daily.  She did stop her pravastatin because she was having myalgias - she has not taken it for about 3 months.  She checks her BP at home and it has been running high.   Patient Active Problem List   Diagnosis Date Noted  . Pure hypercholesterolemia 12/21/2014  . HTN (hypertension) 06/13/2012  . Diabetes mellitus type II, uncontrolled ([place]) 11/14/2011  . Diabetic peripheral neuropathy associated with type 2 diabetes mellitus ([place]) 11/14/2011  . Obesity, Class III, BMI 40-49.9 (morbid obesity) ([place]) 11/14/2011  . Menopause 11/14/2011  . Mass on back 10/28/2011  . Pleural effusion 03/20/2011  . Cholecystitis chronic 01/24/2011  . Serous cystadenoma of pancreas 01/24/2011    Current Outpatient Prescriptions on File Prior to Visit  Medication Sig Dispense Refill  . albuterol (PROVENTIL HFA;VENTOLIN HFA) 108 (90 BASE) MCG/ACT inhaler Inhale 2 puffs into the lungs every 6 (six) hours as needed for wheezing or shortness of breath (cough, shortness of breath or wheezing.). 1 Inhaler 1  . glucose blood (ONE TOUCH ULTRA TEST) test strip Test blood sugar daily. 100 each 2  . ibuprofen (ADVIL,MOTRIN) 600 MG tablet Take 1 tablet (600 mg total) by mouth every 6 (six) hours as needed. 120 tablet 3  . lisinopril-hydrochlorothiazide (PRINZIDE,ZESTORETIC) 20-12.5 MG tablet Take 1 tablet by mouth daily. 30 tablet 0  . metFORMIN (GLUCOPHAGE) 1000 MG tablet Take 1 tablet (1,000 mg total) by mouth 2 (two) times daily with a meal. 180 tablet 3  . pantoprazole (PROTONIX) 40 MG  tablet Take 1 tablet (40 mg total) by mouth daily. 90 tablet 4  . sitaGLIPtin (JANUVIA) 100 MG tablet TAKE ONE TABLET BY MOUTH ONCE DAILY.   "NO MORE REFILLS WITHOUT OFFICE VISIT" 90 tablet 0  . Pancrelipase, Lip-Prot-Amyl, (ZENPEP) 5000 UNITS CPEP Take 1 capsule (5,000 Units total) by mouth 3 (three) times daily. (Patient not taking: Reported on 07/07/2015) 90 capsule 4  . triamcinolone cream (KENALOG) 0.1 % Apply 1 application topically 2 (two) times daily. (Patient not taking: Reported on 07/07/2015) 30 g 0   No current facility-administered medications on file prior to visit.    Allergies  Allergen Reactions  . Norvasc [Amlodipine] Nausea Only  . Pravastatin     myalgia    Review of Systems  Constitutional: Negative for fever and chills.  Gastrointestinal: Positive for nausea (mild now). Negative for abdominal pain and diarrhea.  Genitourinary: Positive for dysuria, urgency, frequency and hematuria. Negative for vaginal discharge.  Musculoskeletal: Positive for back pain (left side).   Objective:  BP 142/102 mmHg  Pulse 78  Temp(Src) 98 F (36.7 C) (Oral)  Resp 18  Ht 5\' 6"  (1.676 m)  Wt 274 lb 9.6 oz (124.558 kg)  BMI 44.34 kg/m2  SpO2 96%  Physical Exam  Constitutional: She is oriented to person, place, and time and well-developed, well-nourished, and in no distress.  HENT:  Head: Normocephalic and atraumatic.  Right Ear: External ear normal.  Left Ear: External ear normal.  Eyes: Conjunctivae are normal.  Cardiovascular:  Normal rate, regular rhythm and normal heart sounds.   No murmur heard. Pulmonary/Chest: Effort normal and breath sounds normal.  Abdominal: Soft. There is tenderness in the suprapubic area. There is no CVA tenderness.  Neurological: She is alert and oriented to person, place, and time. Gait normal.  Skin: Skin is warm and dry.  Psychiatric: Mood, memory, affect and judgment normal.  Vitals reviewed.   Assessment and Plan :  Hematuria - Plan:  POCT Microscopic Urinalysis ([facility]), POCT urinalysis dipstick, Care order/instruction  Dysuria - Plan: POCT Microscopic Urinalysis ([facility]), POCT urinalysis dipstick, Urine culture  Essential hypertension - Plan: metoprolol succinate (TOPROL-XL) 100 MG 24 hr tablet  Acute cystitis with hematuria - Plan: nitrofurantoin, macrocrystal-monohydrate, (MACROBID) 100 MG capsule, phenazopyridine (PYRIDIUM) 200 MG tablet  Elevated cholesterol - Plan: rosuvastatin (CRESTOR) 10 MG tablet   Treat patient for her UTI and she will push fluids.  We will increase her HTN medication because her BP is not well controlled.  We discussed that she has gained some weight and she plans to get into the gym with her trainer again. We need to change and increase her DM medications and we talked about medication that we will add when she see me in a month.  We added another statin today because she needs to be on one.  She will monitor her BP and we talked about once daily blood glucose checking either fasting am and check 2h after a meal and she will record this and bring it to me for review.  Windell Hummingbird PA-C  [facility] 07/07/2015 10:04 AM

## 2015-07-07 NOTE — Patient Instructions (Signed)
Recheck with me in a month  Increase your Toprol to 100mg  a day  Check your glucose once a day - rotate between fasting and 2 hours after meals

## 2015-07-09 LAB — URINE CULTURE: Colony Count: >=100000

## 2015-07-31 ENCOUNTER — Other Ambulatory Visit: Payer: Self-pay | Admitting: Physician Assistant

## 2015-08-01 ENCOUNTER — Telehealth: Payer: Self-pay

## 2015-08-01 NOTE — Telephone Encounter (Signed)
Pt is checking on her refill request of her blood pressure medication   Best number [phone]

## 2015-08-01 NOTE — Telephone Encounter (Signed)
Rx sent 

## 2015-08-17 ENCOUNTER — Ambulatory Visit: Payer: BLUE CROSS/BLUE SHIELD | Admitting: Physician Assistant

## 2015-08-17 ENCOUNTER — Ambulatory Visit (INDEPENDENT_AMBULATORY_CARE_PROVIDER_SITE_OTHER): Payer: BLUE CROSS/BLUE SHIELD | Admitting: Physician Assistant

## 2015-08-17 ENCOUNTER — Encounter: Payer: Self-pay | Admitting: Physician Assistant

## 2015-08-17 VITALS — BP 158/92 | HR 85 | Temp 98.7°F | Resp 16 | Ht 66.0 in | Wt 269.8 lb

## 2015-08-17 DIAGNOSIS — E1165 Type 2 diabetes mellitus with hyperglycemia: Secondary | ICD-10-CM | POA: Diagnosis not present

## 2015-08-17 DIAGNOSIS — D279 Benign neoplasm of unspecified ovary: Secondary | ICD-10-CM | POA: Diagnosis not present

## 2015-08-17 DIAGNOSIS — B9689 Other specified bacterial agents as the cause of diseases classified elsewhere: Secondary | ICD-10-CM

## 2015-08-17 DIAGNOSIS — I10 Essential (primary) hypertension: Secondary | ICD-10-CM | POA: Diagnosis not present

## 2015-08-17 DIAGNOSIS — A499 Bacterial infection, unspecified: Secondary | ICD-10-CM | POA: Diagnosis not present

## 2015-08-17 DIAGNOSIS — K219 Gastro-esophageal reflux disease without esophagitis: Secondary | ICD-10-CM | POA: Diagnosis not present

## 2015-08-17 DIAGNOSIS — Z1321 Encounter for screening for nutritional disorder: Secondary | ICD-10-CM

## 2015-08-17 DIAGNOSIS — E1142 Type 2 diabetes mellitus with diabetic polyneuropathy: Secondary | ICD-10-CM

## 2015-08-17 DIAGNOSIS — R300 Dysuria: Secondary | ICD-10-CM | POA: Diagnosis not present

## 2015-08-17 DIAGNOSIS — N760 Acute vaginitis: Secondary | ICD-10-CM

## 2015-08-17 DIAGNOSIS — IMO0002 Reserved for concepts with insufficient information to code with codable children: Secondary | ICD-10-CM

## 2015-08-17 DIAGNOSIS — E7800 Pure hypercholesterolemia, unspecified: Secondary | ICD-10-CM

## 2015-08-17 DIAGNOSIS — E559 Vitamin D deficiency, unspecified: Secondary | ICD-10-CM

## 2015-08-17 LAB — POCT URINALYSIS DIP (MANUAL ENTRY)
Bilirubin, UA: NEGATIVE
Blood, UA: NEGATIVE
Glucose, UA: >=1000 — AB
Ketones, POC UA: NEGATIVE
Leukocytes, UA: NEGATIVE
Nitrite, UA: NEGATIVE
Protein Ur, POC: NEGATIVE
Spec Grav, UA: 1.02
Urobilinogen, UA: 0.2
pH, UA: 5

## 2015-08-17 LAB — COMPLETE METABOLIC PANEL WITH GFR
ALT: 41 U/L — ABNORMAL HIGH (ref 6–29)
AST: 19 U/L (ref 10–35)
Albumin: 4.4 g/dL (ref 3.6–5.1)
Alkaline Phosphatase: 71 U/L (ref 33–130)
BUN: 11 mg/dL (ref 7–25)
CO2: 27 mmol/L (ref 20–31)
Calcium: 9.5 mg/dL (ref 8.6–10.4)
Chloride: 99 mmol/L (ref 98–110)
Creat: 0.73 mg/dL (ref 0.50–1.05)
GFR, Est African American: >89 mL/min (ref >=60)
GFR, Est Non African American: >89 mL/min (ref >=60)
Glucose, Bld: 334 mg/dL — ABNORMAL HIGH (ref 65–99)
Potassium: 4.2 mmol/L (ref 3.5–5.3)
Sodium: 137 mmol/L (ref 135–146)
Total Bilirubin: 0.4 mg/dL (ref 0.2–1.2)
Total Protein: 7.1 g/dL (ref 6.1–8.1)

## 2015-08-17 LAB — LIPID PANEL
Cholesterol: 194 mg/dL (ref 125–200)
HDL: 61 mg/dL (ref >=46)
LDL Cholesterol: 119 mg/dL (ref <130)
Total CHOL/HDL Ratio: 3.2 Ratio (ref <=5.0)
Triglycerides: 72 mg/dL (ref <150)
VLDL: 14 mg/dL (ref <30)

## 2015-08-17 LAB — POCT WET + KOH PREP
Trich by wet prep: ABSENT
Yeast by KOH: ABSENT
Yeast by wet prep: ABSENT

## 2015-08-17 LAB — POC MICROSCOPIC URINALYSIS (UMFC): Mucus: ABSENT

## 2015-08-17 LAB — LIPASE: Lipase: 13 U/L (ref 7–60)

## 2015-08-17 LAB — POCT GLYCOSYLATED HEMOGLOBIN (HGB A1C): Hemoglobin A1C: 10.2

## 2015-08-17 MED ORDER — PANCRELIPASE (LIP-PROT-AMYL) 5000 UNITS PO CPEP: 1.0000 | ORAL_CAPSULE | Freq: Three times a day (TID) | ORAL | Status: DC

## 2015-08-17 MED ORDER — DULAGLUTIDE 0.75 MG/0.5ML ~~LOC~~ SOAJ: 0.7500 mg | SUBCUTANEOUS | Status: DC

## 2015-08-17 MED ORDER — PANTOPRAZOLE SODIUM 40 MG PO TBEC: 40.0000 mg | DELAYED_RELEASE_TABLET | Freq: Every day | ORAL | Status: DC

## 2015-08-17 MED ORDER — LISINOPRIL-HYDROCHLOROTHIAZIDE 20-12.5 MG PO TABS: 1.0000 | ORAL_TABLET | Freq: Every day | ORAL | Status: DC

## 2015-08-17 MED ORDER — METOPROLOL SUCCINATE ER 200 MG PO TB24: 200.0000 mg | ORAL_TABLET | Freq: Every day | ORAL | Status: DC

## 2015-08-17 MED ORDER — METRONIDAZOLE 500 MG PO TABS: 500.0000 mg | ORAL_TABLET | Freq: Two times a day (BID) | ORAL | Status: AC

## 2015-08-17 MED ORDER — METFORMIN HCL 1000 MG PO TABS: 1000.0000 mg | ORAL_TABLET | Freq: Two times a day (BID) | ORAL | Status: DC

## 2015-08-17 NOTE — Progress Notes (Signed)
[name]  MRN: [id number] DOB: Mar 10, 1961  Subjective:  Pt presents to clinic for her recheck.  She was unable to tolerate the Crestor - it caused her to ache all over - she stopped it and the symptoms resolved - she stated that she does not think that she can function and she knows that she cannot exercise with the discomfort.  She has recently signed back up with a trainer at the gym and plans to start eating healthy and lose some of her weight.  She is trying to figure out how she wants to change her diet.  BP at home - good control Glucose 2h post prandial - low 300s  Exercise - walking (even on vacation) - 1 mile a day  Patient Active Problem List   Diagnosis Date Noted  . Pure hypercholesterolemia 12/21/2014  . HTN (hypertension) 06/13/2012  . Diabetes mellitus type II, uncontrolled ([place]) 11/14/2011  . Diabetic peripheral neuropathy associated with type 2 diabetes mellitus ([place]) 11/14/2011  . Obesity, Class III, BMI 40-49.9 (morbid obesity) ([place]) 11/14/2011  . Menopause 11/14/2011  . Mass on back 10/28/2011  . Pleural effusion 03/20/2011  . Cholecystitis chronic 01/24/2011  . Serous cystadenoma of pancreas 01/24/2011    Current Outpatient Prescriptions on File Prior to Visit  Medication Sig Dispense Refill  . albuterol (PROVENTIL HFA;VENTOLIN HFA) 108 (90 BASE) MCG/ACT inhaler Inhale 2 puffs into the lungs every 6 (six) hours as needed for wheezing or shortness of breath (cough, shortness of breath or wheezing.). 1 Inhaler 1  . glucose blood (ONE TOUCH ULTRA TEST) test strip Test blood sugar daily. 100 each 2  . ibuprofen (ADVIL,MOTRIN) 600 MG tablet Take 1 tablet (600 mg total) by mouth every 6 (six) hours as needed. 120 tablet 3  . triamcinolone cream (KENALOG) 0.1 % Apply 1 application topically 2 (two) times daily. 30 g 0   No current facility-administered medications on file prior to visit.    Allergies  Allergen Reactions  . Norvasc [Amlodipine] Nausea Only    . Pravastatin     myalgia    Review of Systems  Genitourinary: Positive for dysuria (intermittent ).  Musculoskeletal: Positive for myalgias (with crestor use).   Objective:  BP 158/92 mmHg  Pulse 85  Temp(Src) 98.7 F (37.1 C) (Oral)  Resp 16  Ht 5\' 6"  (1.676 m)  Wt 269 lb 12.8 oz (122.38 kg)  BMI 43.57 kg/m2  SpO2 97%  Physical Exam  Constitutional: She is oriented to person, place, and time and well-developed, well-nourished, and in no distress.  HENT:  Head: Normocephalic and atraumatic.  Right Ear: External ear normal.  Left Ear: External ear normal.  Eyes: Conjunctivae are normal.  Cardiovascular: Normal rate, regular rhythm and normal heart sounds.   No murmur heard. Pulmonary/Chest: Effort normal and breath sounds normal.  Neurological: She is alert and oriented to person, place, and time. Gait normal.  Skin: Skin is warm and dry.  Psychiatric: Mood, memory, affect and judgment normal.  Vitals reviewed.   Assessment and Plan :  Essential hypertension - Plan: Lipase, lisinopril-hydrochlorothiazide (PRINZIDE,ZESTORETIC) 20-12.5 MG tablet, metoprolol succinate (TOPROL-XL) 200 MG 24 hr tablet - not controlled - I hope weight loss will help with this but for now we will increase her Toprol dose  Uncontrolled type 2 diabetes mellitus with diabetic polyneuropathy, without long-term current use of insulin ([place]) - Plan: HM Diabetes Foot Exam, POCT glycosylated hemoglobin (Hb A1C), COMPLETE METABOLIC PANEL WITH GFR, Dulaglutide (TRULICITY) [id number] SOPN,  metFORMIN (GLUCOPHAGE) 1000 MG tablet - uncontrolled - we will change medications and see if we get better control - she will really monitor her diet as this will help  Obesity, Class III, BMI 40-49.9 (morbid obesity) ([place]) - continue to work with trainer - pt's goal is 1-2 lbs a week - she will recheck in 4 weeks and after 2 weeks of exercise she will commit to eating changes  Diabetic peripheral neuropathy associated  with type 2 diabetes mellitus ([place])  Elevated cholesterol - Plan: Lipid panel - pt is not able to tolerate statins - she has tried 2 with mylagias with both - we will see what her labs show and then treat if indicated - her labs today will demonstrate result on crestor so we will have to monitor  Dysuria - Plan: POCT urinalysis dipstick, POCT Microscopic Urinalysis ([facility]), POCT Wet + KOH Prep - ? Related to BV vs glucose in the urine -   Serous cystadenoma, unspecified laterality - Plan: Pancrelipase, Lip-Prot-Amyl, (ZENPEP) 5000 units CPEP  Encounter for vitamin deficiency screening - Plan: Vitamin D, 25-hydroxy  Gastroesophageal reflux disease without esophagitis - Plan: pantoprazole (PROTONIX) 40 MG tablet  BV (bacterial vaginosis) - Plan: metroNIDAZOLE (FLAGYL) 500 MG tablet  Windell Hummingbird PA-C  [facility] 08/18/2015 10:25 AM

## 2015-08-18 LAB — VITAMIN D 25 HYDROXY (VIT D DEFICIENCY, FRACTURES): Vit D, 25-Hydroxy: 13 ng/mL — ABNORMAL LOW (ref 30–100)

## 2015-08-22 MED ORDER — VITAMIN D (ERGOCALCIFEROL) 1.25 MG (50000 UNIT) PO CAPS: 50000.0000 [IU] | ORAL_CAPSULE | ORAL | Status: DC

## 2015-08-22 NOTE — Addendum Note (Signed)
Addended by: Mancel Bale on: 08/22/2015 05:48 PM   Modules accepted: Orders, SmartSet

## 2015-09-04 ENCOUNTER — Other Ambulatory Visit: Payer: Self-pay

## 2015-09-04 DIAGNOSIS — Z1231 Encounter for screening mammogram for malignant neoplasm of breast: Secondary | ICD-10-CM

## 2015-09-19 ENCOUNTER — Ambulatory Visit
Admission: RE | Admit: 2015-09-19 | Discharge: 2015-09-19 | Disposition: A | Discharge status: Home / Self Care | Payer: BLUE CROSS/BLUE SHIELD | Source: Ambulatory Visit

## 2015-09-19 DIAGNOSIS — Z1231 Encounter for screening mammogram for malignant neoplasm of breast: Secondary | ICD-10-CM

## 2015-09-21 ENCOUNTER — Ambulatory Visit (INDEPENDENT_AMBULATORY_CARE_PROVIDER_SITE_OTHER): Payer: BLUE CROSS/BLUE SHIELD | Admitting: Physician Assistant

## 2015-09-21 ENCOUNTER — Encounter: Payer: Self-pay | Admitting: Physician Assistant

## 2015-09-21 VITALS — BP 143/87 | HR 75 | Temp 98.4°F | Resp 16 | Ht 66.5 in | Wt 264.8 lb

## 2015-09-21 DIAGNOSIS — M25551 Pain in right hip: Secondary | ICD-10-CM

## 2015-09-21 DIAGNOSIS — E1142 Type 2 diabetes mellitus with diabetic polyneuropathy: Secondary | ICD-10-CM

## 2015-09-21 DIAGNOSIS — I10 Essential (primary) hypertension: Secondary | ICD-10-CM

## 2015-09-21 DIAGNOSIS — E7800 Pure hypercholesterolemia, unspecified: Secondary | ICD-10-CM | POA: Diagnosis not present

## 2015-09-21 DIAGNOSIS — E1165 Type 2 diabetes mellitus with hyperglycemia: Secondary | ICD-10-CM

## 2015-09-21 DIAGNOSIS — IMO0002 Reserved for concepts with insufficient information to code with codable children: Secondary | ICD-10-CM

## 2015-09-21 DIAGNOSIS — D279 Benign neoplasm of unspecified ovary: Secondary | ICD-10-CM | POA: Diagnosis not present

## 2015-09-21 NOTE — Patient Instructions (Signed)
Piriformis Psoas   Get some stretches

## 2015-09-21 NOTE — Progress Notes (Signed)
[name]  MRN: [id number] DOB: [birth year]  Subjective:  Pt presents to clinic for a recheck since she started her trulicity and her weight loss plan.  She has been doing really well with her eating habits - she is cooking more at home, baking more of her meats and she is exercising with a trainer.  She has tolerated the trulicity ok.  Her numbers are now 150-170 after meals vs [id number] before the trulicity.  She is still having some left sided back pain that is similar to the pain she had prior to her partial pancreectomy - she would to make sure her pancreas is ok.  She is having some right hip pain since she started to work out.  Patient Active Problem List   Diagnosis Date Noted  . Pure hypercholesterolemia 12/21/2014  . HTN (hypertension) 06/13/2012  . Diabetes mellitus type II, uncontrolled ([place]) 11/14/2011  . Diabetic peripheral neuropathy associated with type 2 diabetes mellitus ([place]) 11/14/2011  . Obesity, Class III, BMI 40-49.9 (morbid obesity) ([place]) 11/14/2011  . Menopause 11/14/2011  . Mass on back 10/28/2011  . Pleural effusion 03/20/2011  . Cholecystitis chronic 01/24/2011  . Serous cystadenoma of pancreas 01/24/2011    Current Outpatient Prescriptions on File Prior to Visit  Medication Sig Dispense Refill  . albuterol (PROVENTIL HFA;VENTOLIN HFA) 108 (90 BASE) MCG/ACT inhaler Inhale 2 puffs into the lungs every 6 (six) hours as needed for wheezing or shortness of breath (cough, shortness of breath or wheezing.). 1 Inhaler 1  . Dulaglutide (TRULICITY) A999333 0000000 SOPN Inject 0.75 mg into the skin once a week. 12 pen 0  . glucose blood (ONE TOUCH ULTRA TEST) test strip Test blood sugar daily. 100 each 2  . ibuprofen (ADVIL,MOTRIN) 600 MG tablet Take 1 tablet (600 mg total) by mouth every 6 (six) hours as needed. 120 tablet 3  . lisinopril-hydrochlorothiazide (PRINZIDE,ZESTORETIC) 20-12.5 MG tablet Take 1 tablet by mouth daily. 90 tablet 0  . metFORMIN (GLUCOPHAGE)  1000 MG tablet Take 1 tablet (1,000 mg total) by mouth 2 (two) times daily with a meal. 180 tablet 3  . metoprolol succinate (TOPROL-XL) 200 MG 24 hr tablet Take 1 tablet (200 mg total) by mouth daily. Take with or immediately following a meal. 90 tablet 0  . pantoprazole (PROTONIX) 40 MG tablet Take 1 tablet (40 mg total) by mouth daily. 90 tablet 4  . triamcinolone cream (KENALOG) 0.1 % Apply 1 application topically 2 (two) times daily. 30 g 0  . Vitamin D, Ergocalciferol, (DRISDOL) 50000 units CAPS capsule Take 1 capsule (50,000 Units total) by mouth every 7 (seven) days. 12 capsule 0  . Pancrelipase, Lip-Prot-Amyl, (ZENPEP) 5000 units CPEP Take 1 capsule (5,000 Units total) by mouth 3 (three) times daily. (Patient not taking: Reported on 09/21/2015) 270 capsule 4   No current facility-administered medications on file prior to visit.    Allergies  Allergen Reactions  . Norvasc [Amlodipine] Nausea Only  . Pravastatin     myalgia    Review of Systems  Constitutional: Negative for fever and chills.   Objective:  BP 143/87 mmHg  Pulse 75  Temp(Src) 98.4 F (36.9 C) (Oral)  Resp 16  Ht 5' 6.5" (1.689 m)  Wt 264 lb 12.8 oz (120.112 kg)  BMI 42.10 kg/m2  SpO2 96%  Physical Exam  Constitutional: She is oriented to person, place, and time and well-developed, well-nourished, and in no distress.  HENT:  Head: Normocephalic and atraumatic.  Right Ear: Hearing and  external ear normal.  Left Ear: Hearing and external ear normal.  Eyes: Conjunctivae are normal.  Neck: Normal range of motion.  Pulmonary/Chest: Effort normal.  Musculoskeletal:       Right hip: Normal.       Left hip: Normal.  Neurological: She is alert and oriented to person, place, and time. Gait normal.  Skin: Skin is warm and dry.  Psychiatric: Mood, memory, affect and judgment normal.  Vitals reviewed.   Assessment and Plan :  Essential hypertension - elevated today  Uncontrolled type 2 diabetes mellitus with  diabetic polyneuropathy, without long-term current use of insulin (HCC) - better daily glucose numbers - she will continue her eating changes and exercise and we will recheck labs in 2 months at her f/u.  Pure hypercholesterolemia - will recheck in 2 months because the patient is unable to tolerate the statins  Obesity - continue good weight loss efforts - 5lbs in 4 weeks!!!  pancreas cyst history - she will f/u with GI due to her similar pain - her insurance will no loger cover pancrealipase medications - she has not been on it in months  Right hip pain - hip exam looks ok today - we discussed her weight as a reason as well as her recent change in exercise - she will start with better hip stretching for the psoas and piriformis as these are likely culprits of pain  Windell Hummingbird PA-C  [facility] 09/21/2015 1:46 PM

## 2015-10-06 LAB — HM DIABETES EYE EXAM

## 2015-10-10 ENCOUNTER — Encounter: Payer: Self-pay | Admitting: Physician Assistant

## 2015-10-10 DIAGNOSIS — H3500 Unspecified background retinopathy: Secondary | ICD-10-CM | POA: Insufficient documentation

## 2015-10-11 ENCOUNTER — Encounter: Payer: Self-pay | Admitting: Family Medicine

## 2015-11-07 ENCOUNTER — Ambulatory Visit (INDEPENDENT_AMBULATORY_CARE_PROVIDER_SITE_OTHER): Payer: BLUE CROSS/BLUE SHIELD

## 2015-11-07 ENCOUNTER — Ambulatory Visit (INDEPENDENT_AMBULATORY_CARE_PROVIDER_SITE_OTHER): Payer: BLUE CROSS/BLUE SHIELD | Admitting: Physician Assistant

## 2015-11-07 VITALS — BP 170/96 | HR 80 | Temp 98.4°F | Resp 18 | Ht 66.0 in | Wt 260.0 lb

## 2015-11-07 DIAGNOSIS — E1165 Type 2 diabetes mellitus with hyperglycemia: Secondary | ICD-10-CM | POA: Diagnosis not present

## 2015-11-07 DIAGNOSIS — E1142 Type 2 diabetes mellitus with diabetic polyneuropathy: Secondary | ICD-10-CM | POA: Diagnosis not present

## 2015-11-07 DIAGNOSIS — R05 Cough: Secondary | ICD-10-CM

## 2015-11-07 DIAGNOSIS — M238X1 Other internal derangements of right knee: Secondary | ICD-10-CM

## 2015-11-07 DIAGNOSIS — M2391 Unspecified internal derangement of right knee: Secondary | ICD-10-CM

## 2015-11-07 DIAGNOSIS — IMO0002 Reserved for concepts with insufficient information to code with codable children: Secondary | ICD-10-CM

## 2015-11-07 DIAGNOSIS — R059 Cough, unspecified: Secondary | ICD-10-CM

## 2015-11-07 MED ORDER — GUAIFENESIN ER 1200 MG PO TB12: 1.0000 | ORAL_TABLET | Freq: Two times a day (BID) | ORAL | Status: AC

## 2015-11-07 MED ORDER — AZITHROMYCIN 250 MG PO TABS: ORAL_TABLET | ORAL | Status: AC

## 2015-11-07 MED ORDER — DULAGLUTIDE 0.75 MG/0.5ML ~~LOC~~ SOAJ: 0.7500 mg | SUBCUTANEOUS | Status: DC

## 2015-11-07 MED ORDER — HYDROCOD POLST-CPM POLST ER 10-8 MG/5ML PO SUER: 5.0000 mL | Freq: Two times a day (BID) | ORAL | Status: DC | PRN

## 2015-11-07 NOTE — Patient Instructions (Signed)
     IF you received an x-ray today, you will receive an invoice from [facility]. Please contact [facility] at [phone] with questions or concerns regarding your invoice.   IF you received labwork today, you will receive an invoice from [facility]. Please contact [facility] at [phone] with questions or concerns regarding your invoice.   Our billing staff will not be able to assist you with questions regarding bills from these companies.  You will be contacted with the lab results as soon as they are available. The fastest way to get your results is to activate your My Chart account. Instructions are located on the last page of this paperwork. If you have not heard from us regarding the results in 2 weeks, please contact this office.      

## 2015-11-07 NOTE — Progress Notes (Signed)
[name]  MRN: [id number] DOB: 08/24/1960  Subjective:  Pt presents to clinic with chest tightness and fullness that felt like she did when she had pneumonia.  She has had a productive cough for the last 7-10 days and she is worried.  She is having no SOB or wheezing.  She has yellow thick white sputum and she is having almost no sinus congestion or rhinorrhea for the last several days.    Glucose at home has been in the 120s-130s - she has continued to work with her trainer and has continued to lose weight. Since she has been working out she notices that her left knee pops a lot - she has no pain and she has not stopped what she has been doing but it does worry her because she wants to make sure she is not making it worse.  Patient Active Problem List   Diagnosis Date Noted  . Retinopathy 10/10/2015  . Pure hypercholesterolemia 12/21/2014  . HTN (hypertension) 06/13/2012  . Diabetes mellitus type II, uncontrolled ([place]) 11/14/2011  . Diabetic peripheral neuropathy associated with type 2 diabetes mellitus ([place]) 11/14/2011  . Obesity, Class III, BMI 40-49.9 (morbid obesity) ([place]) 11/14/2011  . Menopause 11/14/2011  . Mass on back 10/28/2011  . Pleural effusion 03/20/2011  . Cholecystitis chronic 01/24/2011  . Serous cystadenoma of pancreas 01/24/2011    Current Outpatient Prescriptions on File Prior to Visit  Medication Sig Dispense Refill  . albuterol (PROVENTIL HFA;VENTOLIN HFA) 108 (90 BASE) MCG/ACT inhaler Inhale 2 puffs into the lungs every 6 (six) hours as needed for wheezing or shortness of breath (cough, shortness of breath or wheezing.). 1 Inhaler 1  . glucose blood (ONE TOUCH ULTRA TEST) test strip Test blood sugar daily. 100 each 2  . ibuprofen (ADVIL,MOTRIN) 600 MG tablet Take 1 tablet (600 mg total) by mouth every 6 (six) hours as needed. 120 tablet 3  . lisinopril-hydrochlorothiazide (PRINZIDE,ZESTORETIC) 20-12.5 MG tablet Take 1 tablet by mouth daily. 90 tablet 0  .  metFORMIN (GLUCOPHAGE) 1000 MG tablet Take 1 tablet (1,000 mg total) by mouth 2 (two) times daily with a meal. 180 tablet 3  . metoprolol succinate (TOPROL-XL) 200 MG 24 hr tablet Take 1 tablet (200 mg total) by mouth daily. Take with or immediately following a meal. 90 tablet 0  . triamcinolone cream (KENALOG) 0.1 % Apply 1 application topically 2 (two) times daily. 30 g 0  . Vitamin D, Ergocalciferol, (DRISDOL) 50000 units CAPS capsule Take 1 capsule (50,000 Units total) by mouth every 7 (seven) days. 12 capsule 0  . Pancrelipase, Lip-Prot-Amyl, (ZENPEP) 5000 units CPEP Take 1 capsule (5,000 Units total) by mouth 3 (three) times daily. (Patient not taking: Reported on 09/21/2015) 270 capsule 4  . pantoprazole (PROTONIX) 40 MG tablet Take 1 tablet (40 mg total) by mouth daily. 90 tablet 4   No current facility-administered medications on file prior to visit.    Allergies  Allergen Reactions  . Norvasc [Amlodipine] Nausea Only  . Pravastatin     myalgia    Review of Systems  Constitutional: Negative for fever and chills.  HENT: Positive for congestion. Negative for rhinorrhea.   Respiratory: Positive for cough (yellow/white). Negative for shortness of breath and wheezing.   Musculoskeletal: Negative for gait problem.   Objective:  BP 170/96 mmHg  Pulse 80  Temp(Src) 98.4 F (36.9 C)  Resp 18  Ht 5\' 6"  (1.676 m)  Wt 260 lb (117.935 kg)  BMI 41.99 kg/m2  SpO2 95%  Physical Exam  Constitutional: She is oriented to person, place, and time and well-developed, well-nourished, and in no distress.  HENT:  Head: Normocephalic and atraumatic.  Right Ear: Hearing, tympanic membrane, external ear and ear canal normal.  Left Ear: Hearing, tympanic membrane, external ear and ear canal normal.  Nose: Nose normal.  Mouth/Throat: Uvula is midline, oropharynx is clear and moist and mucous membranes are normal.  Eyes: Conjunctivae are normal.  Neck: Normal range of motion.  Cardiovascular:  Normal rate, regular rhythm and normal heart sounds.   No murmur heard. Pulmonary/Chest: Effort normal and breath sounds normal. She has no wheezes.  Musculoskeletal:       Right knee: She exhibits normal range of motion, no swelling and no effusion. No tenderness found.  Crepitus that seems related to her patella  Neurological: She is alert and oriented to person, place, and time. Gait normal.  Skin: Skin is warm and dry.  Psychiatric: Mood, memory, affect and judgment normal.  Vitals reviewed.   Dg Chest 2 View  11/07/2015  CLINICAL DATA:  Chest tightness, productive cough for 1 week EXAM: CHEST  2 VIEW COMPARISON:  04/03/2015 FINDINGS: Heart is borderline in size. Lungs are clear. No effusions or edema. No acute bony abnormality. IMPRESSION: No active cardiopulmonary disease. Electronically Signed   By: Rolm Baptise M.D.   On: 11/07/2015 13:28   Dg Knee Complete 4 Views Right  11/07/2015  CLINICAL DATA:  Knee popping since beginning workouts.  Crepitus. EXAM: RIGHT KNEE - COMPLETE 4+ VIEW COMPARISON:  None. FINDINGS: Mild degenerative changes in the medial and patellofemoral compartments with joint space narrowing and spurring. Stomach, large and small bowel grossly unremarkable. No joint effusion. IMPRESSION: Mild degenerative changes in the medial and patellofemoral compartments. No acute bony abnormality. Electronically Signed   By: Rolm Baptise M.D.   On: 11/07/2015 13:29    Assessment and Plan :  Cough - Plan: DG Chest 2 View, Guaifenesin (MUCINEX MAXIMUM STRENGTH) 1200 MG TB12, azithromycin (ZITHROMAX Z-PAK) 250 MG tablet, chlorpheniramine-HYDROcodone (TUSSIONEX PENNKINETIC ER) 10-8 MG/5ML SURE - treat for an atypical acute bronchitis - we discussed that her glucose may increase slightly with the syrup but she will continue to monitor  Elevated BP - she has been using OTC cold preps and she will start to monitor at home - she plans to see me in 2-3 weeks and we will check at that visit  - she states that it is normal at home and always higher when she is here  Knee crepitus, right - Plan: DG Knee Complete 4 Views Right - likely arthritis - she will continue to lose weight to reduce her aggravation of this   Uncontrolled type 2 diabetes mellitus with diabetic polyneuropathy, without long-term current use of insulin ([place]) - Plan: Dulaglutide (TRULICITY) A999333 0000000 SOPN - refilled medications  Windell Hummingbird PA-C  [facility] 11/07/2015 2:42 PM

## 2015-11-20 ENCOUNTER — Other Ambulatory Visit: Payer: Self-pay | Admitting: Physician Assistant

## 2015-11-21 ENCOUNTER — Other Ambulatory Visit: Payer: Self-pay | Admitting: Physician Assistant

## 2016-01-09 ENCOUNTER — Ambulatory Visit (INDEPENDENT_AMBULATORY_CARE_PROVIDER_SITE_OTHER): Payer: BLUE CROSS/BLUE SHIELD | Admitting: Urgent Care

## 2016-01-09 VITALS — BP 140/80 | HR 75 | Temp 98.7°F | Resp 18 | Ht 66.0 in | Wt 264.0 lb

## 2016-01-09 DIAGNOSIS — E559 Vitamin D deficiency, unspecified: Secondary | ICD-10-CM

## 2016-01-09 DIAGNOSIS — R300 Dysuria: Secondary | ICD-10-CM

## 2016-01-09 DIAGNOSIS — Z90411 Acquired partial absence of pancreas: Secondary | ICD-10-CM | POA: Diagnosis not present

## 2016-01-09 DIAGNOSIS — N3090 Cystitis, unspecified without hematuria: Secondary | ICD-10-CM | POA: Diagnosis not present

## 2016-01-09 DIAGNOSIS — K219 Gastro-esophageal reflux disease without esophagitis: Secondary | ICD-10-CM | POA: Diagnosis not present

## 2016-01-09 LAB — POCT URINALYSIS DIP (MANUAL ENTRY)
Bilirubin, UA: NEGATIVE
Glucose, UA: NEGATIVE
Ketones, POC UA: NEGATIVE
Nitrite, UA: NEGATIVE
Spec Grav, UA: 1.02
Urobilinogen, UA: 0.2
pH, UA: 5

## 2016-01-09 LAB — POC MICROSCOPIC URINALYSIS (UMFC): Mucus: ABSENT

## 2016-01-09 MED ORDER — PANTOPRAZOLE SODIUM 40 MG PO TBEC: 40.0000 mg | DELAYED_RELEASE_TABLET | Freq: Every day | ORAL | Status: DC

## 2016-01-09 MED ORDER — VITAMIN D (ERGOCALCIFEROL) 1.25 MG (50000 UNIT) PO CAPS: 50000.0000 [IU] | ORAL_CAPSULE | ORAL | Status: DC

## 2016-01-09 MED ORDER — CIPROFLOXACIN HCL 500 MG PO TABS: 500.0000 mg | ORAL_TABLET | Freq: Two times a day (BID) | ORAL | Status: DC

## 2016-01-09 NOTE — Patient Instructions (Addendum)
Urinary Tract Infection Urinary tract infections (UTIs) can develop anywhere along your urinary tract. Your urinary tract is your body's drainage system for removing wastes and extra water. Your urinary tract includes two kidneys, two ureters, a bladder, and a urethra. Your kidneys are a pair of bean-shaped organs. Each kidney is about the size of your fist. They are located below your ribs, one on each side of your spine. CAUSES Infections are caused by microbes, which are microscopic organisms, including fungi, viruses, and bacteria. These organisms are so small that they can only be seen through a microscope. Bacteria are the microbes that most commonly cause UTIs. SYMPTOMS  Symptoms of UTIs may vary by age and gender of the patient and by the location of the infection. Symptoms in young women typically include a frequent and intense urge to urinate and a painful, burning feeling in the bladder or urethra during urination. Older women and men are more likely to be tired, shaky, and weak and have muscle aches and abdominal pain. A fever may mean the infection is in your kidneys. Other symptoms of a kidney infection include pain in your back or sides below the ribs, nausea, and vomiting. DIAGNOSIS To diagnose a UTI, your caregiver will ask you about your symptoms. Your caregiver will also ask you to provide a urine sample. The urine sample will be tested for bacteria and white blood cells. White blood cells are made by your body to help fight infection. TREATMENT  Typically, UTIs can be treated with medication. Because most UTIs are caused by a bacterial infection, they usually can be treated with the use of antibiotics. The choice of antibiotic and length of treatment depend on your symptoms and the type of bacteria causing your infection. HOME CARE INSTRUCTIONS  If you were prescribed antibiotics, take them exactly as your caregiver instructs you. Finish the medication even if you feel better after  you have only taken some of the medication.  Drink enough water and fluids to keep your urine clear or pale yellow.  Avoid caffeine, tea, and carbonated beverages. They tend to irritate your bladder.  Empty your bladder often. Avoid holding urine for long periods of time.  Empty your bladder before and after sexual intercourse.  After a bowel movement, women should cleanse from front to back. Use each tissue only once. SEEK MEDICAL CARE IF:   You have back pain.  You develop a fever.  Your symptoms do not begin to resolve within 3 days. SEEK IMMEDIATE MEDICAL CARE IF:   You have severe back pain or lower abdominal pain.  You develop chills.  You have nausea or vomiting.  You have continued burning or discomfort with urination. MAKE SURE YOU:   Understand these instructions.  Will watch your condition.  Will get help right away if you are not doing well or get worse.   This information is not intended to replace advice given to you by your health care provider. Make sure you discuss any questions you have with your health care provider.   Document Released: 04/10/2005 Document Revised: 03/22/2015 Document Reviewed: 08/09/2011 Elsevier Interactive Patient Education 2016 [employer].     IF you received an x-ray today, you will receive an invoice from [facility]. Please contact [facility] at [phone] with questions or concerns regarding your invoice.   IF you received labwork today, you will receive an invoice from [employer]. Please contact [facility] at [phone] with questions or concerns regarding your invoice.  Our billing staff will not be able to assist you with questions regarding bills from these companies.  You will be contacted with the lab results as soon as they are available. The fastest way to get your results is to activate your My Chart account. Instructions are located on the last page of this  paperwork. If you have not heard from [place] regarding the results in 2 weeks, please contact this office.

## 2016-01-09 NOTE — Progress Notes (Signed)
MRN: [id number] DOB: 1960-11-17  Subjective:   [name] is a 55 y.o. female presenting for chief complaint of Dysuria and Medication Refill  Dysuria - Reports 5 day history worsening dysuria, hematuria, burning sensation with urination. She's now having low back and flank pain, pelvic pain, nausea. She has used Azo and natural cranberry juice with minimal relief. Denies fever, vomiting, abdominal pain, genital rashes, vaginal itching or discharge.  Vitamin D - Has not had her medication refilled. Has been out for 1 month.   Protonix - Managed with Protonix, needs a refill. Does very well with this medication.   History of pancreatectomy - Would like a referral back to GI to see if she needs to take pancreatic enzymes again. She has been off of this medication for a year because she couldn't afford them. Has noticed any significant changes, just wants a consult.  [name] has a current medication list which includes the following prescription(s): albuterol, dulaglutide, glucose blood, ibuprofen, lisinopril-hydrochlorothiazide, metformin, metoprolol, pantoprazole, triamcinolone cream, vitamin d (ergocalciferol), chlorpheniramine-hydrocodone, and pancrelipase (lip-prot-amyl). Also is allergic to norvasc and pravastatin.  [name]  has a past medical history of Hypertension; Diabetes mellitus; Numbness of fingers; Hyperlipidemia; Abdominal pain; Costochondritis; Allergy; Cancer ([place]); GERD (gastroesophageal reflux disease); and Anemia. Also  has past surgical history that includes Cholecystectomy; Splenectomy, partial (01/09/2011); Pancreatectomy (01/09/2011); Cesarean section; Cystectomy; Breast biopsy; and Abdominal hysterectomy.  Objective:   Vitals: BP 140/80 mmHg  Pulse 75  Temp(Src) 98.7 F (37.1 C) (Oral)  Resp 18  Ht 5\' 6"  (1.676 m)  Wt 264 lb (119.75 kg)  BMI 42.63 kg/m2  SpO2 99%  Physical Exam  Constitutional: She is oriented to person, place, and time. She appears  well-developed and well-nourished.  HENT:  Mouth/Throat: Oropharynx is clear and moist.  Eyes: No scleral icterus.  Cardiovascular: Normal rate, regular rhythm and intact distal pulses.  Exam reveals no gallop and no friction rub.   No murmur heard. Pulmonary/Chest: No respiratory distress. She has no wheezes. She has no rales.  Abdominal: Soft. Bowel sounds are normal. She exhibits no distension and no mass. There is no tenderness.  Neurological: She is alert and oriented to person, place, and time.  Skin: Skin is warm and dry.   Results for orders placed or performed in visit on 01/09/16 (from the past 24 hour(s))  POCT urinalysis dipstick     Status: Abnormal   Collection Time: 01/09/16 11:28 AM  Result Value Ref Range   Color, UA yellow yellow   Clarity, UA clear clear   Glucose, UA negative negative   Bilirubin, UA negative negative   Ketones, POC UA negative negative   Spec Grav, UA 1.020    Blood, UA moderate (A) negative   pH, UA 5.0    Protein Ur, POC trace (A) negative   Urobilinogen, UA 0.2    Nitrite, UA Negative Negative   Leukocytes, UA Trace (A) Negative  POCT Microscopic Urinalysis ([facility])     Status: Abnormal   Collection Time: 01/09/16 11:34 AM  Result Value Ref Range   WBC,UR,HPF,POC Few (A) None WBC/hpf   RBC,UR,HPF,POC Moderate (A) None RBC/hpf   Bacteria None None, Too numerous to count   Mucus Absent Absent   Epithelial Cells, UR Per Microscopy Few (A) None, Too numerous to count cells/hpf   Assessment and Plan :   1. Vitamin D deficiency - Labs pending, refill provided - VITAMIN D 25 Hydroxy (Vit-D Deficiency, Fractures) - Vitamin D, Ergocalciferol, (DRISDOL) 50000  units CAPS capsule; Take 1 capsule (50,000 Units total) by mouth every 7 (seven) days.  Dispense: 12 capsule; Refill: 3  2. Dysuria 3. Cystitis - Urine culture pending - ciprofloxacin (CIPRO) 500 MG tablet; Take 1 tablet (500 mg total) by mouth 2 (two) times daily.  Dispense: 14 tablet;  Refill: 0 - Urine culture  4. History of partial pancreatectomy - Ambulatory referral to Gastroenterology  5. Gastroesophageal reflux disease without esophagitis - refill provided - pantoprazole (PROTONIX) 40 MG tablet; Take 1 tablet (40 mg total) by mouth daily.  Dispense: 90 tablet; Refill: [place], PA-C [facility] [phone] 01/09/2016 11:21 AM

## 2016-01-10 LAB — VITAMIN D 25 HYDROXY (VIT D DEFICIENCY, FRACTURES): Vit D, 25-Hydroxy: 19 ng/mL — ABNORMAL LOW (ref 30–100)

## 2016-01-12 LAB — URINE CULTURE: Colony Count: 80000

## 2016-01-18 ENCOUNTER — Encounter: Payer: Self-pay | Admitting: Urgent Care

## 2016-03-23 ENCOUNTER — Ambulatory Visit (INDEPENDENT_AMBULATORY_CARE_PROVIDER_SITE_OTHER): Payer: BLUE CROSS/BLUE SHIELD | Admitting: Physician Assistant

## 2016-03-23 VITALS — BP 122/78 | HR 75 | Temp 97.5°F | Resp 18 | Ht 66.0 in | Wt 264.0 lb

## 2016-03-23 DIAGNOSIS — K219 Gastro-esophageal reflux disease without esophagitis: Secondary | ICD-10-CM

## 2016-03-23 DIAGNOSIS — E785 Hyperlipidemia, unspecified: Secondary | ICD-10-CM | POA: Diagnosis not present

## 2016-03-23 DIAGNOSIS — IMO0002 Reserved for concepts with insufficient information to code with codable children: Secondary | ICD-10-CM

## 2016-03-23 DIAGNOSIS — E559 Vitamin D deficiency, unspecified: Secondary | ICD-10-CM

## 2016-03-23 DIAGNOSIS — Z23 Encounter for immunization: Secondary | ICD-10-CM

## 2016-03-23 DIAGNOSIS — E1165 Type 2 diabetes mellitus with hyperglycemia: Secondary | ICD-10-CM | POA: Diagnosis not present

## 2016-03-23 DIAGNOSIS — I10 Essential (primary) hypertension: Secondary | ICD-10-CM

## 2016-03-23 DIAGNOSIS — E1142 Type 2 diabetes mellitus with diabetic polyneuropathy: Secondary | ICD-10-CM | POA: Diagnosis not present

## 2016-03-23 LAB — LIPID PANEL
Cholesterol: 203 mg/dL — ABNORMAL HIGH (ref 125–200)
HDL: 74 mg/dL (ref >=46)
LDL Cholesterol: 115 mg/dL (ref <130)
Total CHOL/HDL Ratio: 2.7 Ratio (ref <=5.0)
Triglycerides: 69 mg/dL (ref <150)
VLDL: 14 mg/dL (ref <30)

## 2016-03-23 LAB — POCT URINALYSIS DIP (MANUAL ENTRY)
Bilirubin, UA: NEGATIVE
Blood, UA: NEGATIVE
Glucose, UA: NEGATIVE
Ketones, POC UA: NEGATIVE
Nitrite, UA: NEGATIVE
Protein Ur, POC: NEGATIVE
Spec Grav, UA: 1.025
Urobilinogen, UA: 0.2
pH, UA: 5

## 2016-03-23 LAB — COMPLETE METABOLIC PANEL WITH GFR
ALT: 29 U/L (ref 6–29)
AST: 18 U/L (ref 10–35)
Albumin: 4.7 g/dL (ref 3.6–5.1)
Alkaline Phosphatase: 54 U/L (ref 33–130)
BUN: 13 mg/dL (ref 7–25)
CO2: 27 mmol/L (ref 20–31)
Calcium: 10 mg/dL (ref 8.6–10.4)
Chloride: 98 mmol/L (ref 98–110)
Creat: 0.7 mg/dL (ref 0.50–1.05)
GFR, Est African American: >89 mL/min (ref >=60)
GFR, Est Non African American: >89 mL/min (ref >=60)
Glucose, Bld: 164 mg/dL — ABNORMAL HIGH (ref 65–99)
Potassium: 4.3 mmol/L (ref 3.5–5.3)
Sodium: 140 mmol/L (ref 135–146)
Total Bilirubin: 0.6 mg/dL (ref 0.2–1.2)
Total Protein: 7.8 g/dL (ref 6.1–8.1)

## 2016-03-23 LAB — POCT GLYCOSYLATED HEMOGLOBIN (HGB A1C): Hemoglobin A1C: 8.2

## 2016-03-23 MED ORDER — PANTOPRAZOLE SODIUM 40 MG PO TBEC: 40.0000 mg | DELAYED_RELEASE_TABLET | Freq: Every day | ORAL | 3 refills | Status: DC

## 2016-03-23 MED ORDER — METOPROLOL SUCCINATE ER 200 MG PO TB24: ORAL_TABLET | ORAL | 0 refills | Status: DC

## 2016-03-23 MED ORDER — LISINOPRIL-HYDROCHLOROTHIAZIDE 20-12.5 MG PO TABS: 1.0000 | ORAL_TABLET | Freq: Every day | ORAL | 0 refills | Status: DC

## 2016-03-23 MED ORDER — VITAMIN D (ERGOCALCIFEROL) 1.25 MG (50000 UNIT) PO CAPS: 50000.0000 [IU] | ORAL_CAPSULE | ORAL | 3 refills | Status: DC

## 2016-03-23 MED ORDER — DULAGLUTIDE 0.75 MG/0.5ML ~~LOC~~ SOAJ
0.7500 mg | SUBCUTANEOUS | 1 refills | Status: DC
Start: 2016-03-23 — End: 2016-10-15

## 2016-03-23 NOTE — Patient Instructions (Addendum)
IF you received an x-ray today, you will receive an invoice from [facility]. Please contact [facility] at [phone] with questions or concerns regarding your invoice.   IF you received labwork today, you will receive an invoice from [employer]. Please contact [facility] at [phone] with questions or concerns regarding your invoice.   Our billing staff will not be able to assist you with questions regarding bills from these companies.  You will be contacted with the lab results as soon as they are available. The fastest way to get your results is to activate your My Chart account. Instructions are located on the last page of this paperwork. If you have not heard from [place] regarding the results in 2 weeks, please contact this office.      What You Need to Know About Vitamin D Vitamin D is a nutrient that helps you to absorb calcium from food. It plays a key role in building and maintaining healthy bones and teeth, muscle function, and immunity from infections. Getting plenty of vitamin D can help to keep you from developing conditions that cause bones to be brittle, such as rickets or osteoporosis. If you are over age 30, not having enough vitamin D may weaken your muscles and bones and increase your risk for falls and fractures. Our bodies make vitamin D when our skin is exposed to direct sunlight. However, for many people, this may not be enough vitamin D to meet the body's needs. When you get too little vitamin D, it is called a deficiency. You may be at risk for a vitamin D deficiency if you:  Are pregnant.  Are obese.  Are over 17 years old.  Have dark skin.  Spend most of your day indoors.  Live in [place].  Cover your skin all of the time when you are outdoors.  Have a condition that limits your ability to absorb fat, such as inflammatory bowel disease.  Have had gastric bypass surgery. Breastfed infants are  also at risk for vitamin D deficiency.  If you are at risk for vitamin D deficiency, your health care provider may recommend that you take a vitamin D supplement. If you have certain diseases, your health care provider may recommend that you take a vitamin D supplement as part of your treatment. WHAT IS MY PLAN? General recommendations for daily vitamin D intake vary by these categories:  Infants: 400 International Units.  Children over 28-year-old: 600 International Units.  Adults: 600 International Units.  Adults over 31 years old: 48 International Units.  Pregnant and breastfeeding women: 600 International Units. Your health care provider may recommend a different amount of vitamin D intake based on your specific needs and your overall health. WHAT DO I NEED TO KNOW ABOUT VITAMIN D INTAKE? You can meet your daily vitamin D needs from:  Direct exposure to sunlight.  Food.  Dietary supplements. Talk with your health care provider before you start taking any vitamin D supplements. You may be more sensitive to the side effects of vitamin D supplements if you are on certain medicines or have certain medical conditions. Be sure to tell your health care provider about all medicines you are taking, including vitamin, mineral, and herbal supplements. Take medicines and supplements only as told by your health care provider. WHAT FOODS CONTAIN VITAMIN D? Grains Fortified cereals. Meats and Other Protein Sources Beef liver. Egg yolk. [place]. Salmon. Swordfish. Shrimp. Sardines. [place]. Dairy Fortified milk. Cheese. Fortified yogurt. Beverages Infant formula.  Fortified orange juice. Fortified soy milk. WHAT ARE SOME TIPS FOR GETTING ENOUGH VITAMIN D?  Expose your skin to direct sunlight for at least 15 minutes every day. If you have dark skin, you may need to expose your skin for a longer period of time. Make sure you protect your skin from too much sun exposure. This helps to prevents skin  cancer.  Eat a balanced diet each day that includes both of these:  2 -3 servings of meat and meat alternatives.  2 servings of dairy.  Talk with your health care provider about taking a vitamin D supplement if:  You live in far [place].  You live in a location that has cloudy days for half of the year.  You regularly avoid exposure to sunlight.  You completely cover your body with clothes or sunscreen when you are outdoors.  You are over 72 years old. Taking a supplement may help to strengthen your bones and prevent fractures.  Infants who consume less than 32 oz of infant formula each day should receive a daily vitamin D supplement.  If your doctor has recommended a vitamin D supplement to help treat a disease, you should have your blood levels of vitamin D checked every 3 months.   This information is not intended to replace advice given to you by your health care provider. Make sure you discuss any questions you have with your health care provider.   Document Released: 03/22/2015 Document Reviewed: 10/26/2014 Elsevier Interactive Patient Education [employer].  Diabetes and Exercise Exercising regularly is important. It is not just about losing weight. It has many health benefits, such as:  Improving your overall fitness, flexibility, and endurance.  Increasing your bone density.  Helping with weight control.  Decreasing your body fat.  Increasing your muscle strength.  Reducing stress and tension.  Improving your overall health. People with diabetes who exercise gain additional benefits because exercise:  Reduces appetite.  Improves the body's use of blood sugar (glucose).  Helps lower or control blood glucose.  Decreases blood pressure.  Helps control blood lipids (such as cholesterol and triglycerides).  Improves the body's use of the hormone insulin by:  Increasing the body's insulin sensitivity.  Reducing the body's insulin  needs.  Decreases the risk for heart disease because exercising:  Lowers cholesterol and triglycerides levels.  Increases the levels of good cholesterol (such as high-density lipoproteins [HDL]) in the body.  Lowers blood glucose levels. YOUR ACTIVITY PLAN  Choose an activity that you enjoy, and set realistic goals. To exercise safely, you should begin practicing any new physical activity slowly, and gradually increase the intensity of the exercise over time. Your health care provider or diabetes educator can help create an activity plan that works for you. General recommendations include:  Encouraging children to engage in at least 60 minutes of physical activity each day.  Stretching and performing strength training exercises, such as yoga or weight lifting, at least 2 times per week.  Performing a total of at least 150 minutes of moderate-intensity exercise each week, such as brisk walking or water aerobics.  Exercising at least 3 days per week, making sure you allow no more than 2 consecutive days to pass without exercising.  Avoiding long periods of inactivity (90 minutes or more). When you have to spend an extended period of time sitting down, take frequent breaks to walk or stretch. RECOMMENDATIONS FOR EXERCISING WITH TYPE 1 OR TYPE 2 DIABETES   Check your blood glucose  before exercising. If blood glucose levels are greater than 240 mg/dL, check for urine ketones. Do not exercise if ketones are present.  Avoid injecting insulin into areas of the body that are going to be exercised. For example, avoid injecting insulin into:  The arms when playing tennis.  The legs when jogging.  Keep a record of:  Food intake before and after you exercise.  Expected peak times of insulin action.  Blood glucose levels before and after you exercise.  The type and amount of exercise you have done.  Review your records with your health care provider. Your health care provider will help you  to develop guidelines for adjusting food intake and insulin amounts before and after exercising.  If you take insulin or oral hypoglycemic agents, watch for signs and symptoms of hypoglycemia. They include:  Dizziness.  Shaking.  Sweating.  Chills.  Confusion.  Drink plenty of water while you exercise to prevent dehydration or heat stroke. Body water is lost during exercise and must be replaced.  Talk to your health care provider before starting an exercise program to make sure it is safe for you. Remember, almost any type of activity is better than none.   This information is not intended to replace advice given to you by your health care provider. Make sure you discuss any questions you have with your health care provider.   Document Released: 09/21/2003 Document Revised: 11/15/2014 Document Reviewed: 12/08/2012 Elsevier Interactive Patient Education [employer].

## 2016-03-23 NOTE — Progress Notes (Signed)
[name]  MRN: [id number] DOB: [birth year]  PCP: Andrell Bergeson Sauer  Subjective:  Pt is a pleasant 55 year old female, history of hypertension, diabetes, pancreatic cystadenoma, obesity, HLD, GERD presenting to clinic for medication refill. She says she has been out of metoprolol and trulicity for a month.  Feeling well. No complaints.   Hypertension -  Lisinopril-HCTZ and metoprolol. Blood pressure today is 122/78. Last appointment 143/87   Diabetes x 6 years. Metformin and started Trulicity seven months ago by [facility] [place]. Has been out of her Trulicity for one month. Checks blood sugars at home: "around 149". Tries to eat a healthy diet. States when she eats green smoothies and healthy foods her sugars are in the 90's. She's trying to get back on track with her diet. Plans to start this month with a trainer at [employer].   Hyperlipidemia - Cannot tolerate statin therapy. Previous lipid panel drawn seven months ago.   Pancreatic cystadenoma - Sees Gi, Dr. Benson [place].  She stopped taking medication due to price. Was given free samples of her medication, now taking. Keeps Annual appointments.   GERD - Needs refill of Protonix.    Review of Systems  Constitutional: Negative.   Cardiovascular: Negative.   Gastrointestinal: Positive for abdominal pain. Negative for constipation, diarrhea and nausea.  Endocrine: Negative.   Neurological: Negative for dizziness, syncope, weakness, light-headedness, numbness and headaches.    Patient Active Problem List   Diagnosis Date Noted  . Retinopathy 10/10/2015  . Pure hypercholesterolemia 12/21/2014  . HTN (hypertension) 06/13/2012  . Diabetes mellitus type II, uncontrolled ([place]) 11/14/2011  . Diabetic peripheral neuropathy associated with type 2 diabetes mellitus ([place]) 11/14/2011  . Obesity, Class III, BMI 40-49.9 (morbid obesity) ([place]) 11/14/2011  . Menopause 11/14/2011  . Mass on back 10/28/2011  . Pleural effusion 03/20/2011  .  Cholecystitis chronic 01/24/2011  . Serous cystadenoma of pancreas 01/24/2011    Current Outpatient Prescriptions on File Prior to Visit  Medication Sig Dispense Refill  . albuterol (PROVENTIL HFA;VENTOLIN HFA) 108 (90 BASE) MCG/ACT inhaler Inhale 2 puffs into the lungs every 6 (six) hours as needed for wheezing or shortness of breath (cough, shortness of breath or wheezing.). 1 Inhaler 1  . Dulaglutide (TRULICITY) A999333 0000000 SOPN Inject 0.75 mg into the skin once a week. 12 pen 1  . glucose blood (ONE TOUCH ULTRA TEST) test strip Test blood sugar daily. 100 each 2  . ibuprofen (ADVIL,MOTRIN) 600 MG tablet Take 1 tablet (600 mg total) by mouth every 6 (six) hours as needed. 120 tablet 3  . lisinopril-hydrochlorothiazide (PRINZIDE,ZESTORETIC) 20-12.5 MG tablet TAKE ONE TABLET BY MOUTH ONCE DAILY 90 tablet 0  . metFORMIN (GLUCOPHAGE) 1000 MG tablet Take 1 tablet (1,000 mg total) by mouth 2 (two) times daily with a meal. 180 tablet 3  . metoprolol (TOPROL-XL) 200 MG 24 hr tablet TAKE ONE TABLET BY MOUTH ONCE DAILY. TAKE WITH OR IMMEDIATELY FOLLOWING A MEAL 90 tablet 0  . Pancrelipase, Lip-Prot-Amyl, (ZENPEP) 5000 units CPEP Take 1 capsule (5,000 Units total) by mouth 3 (three) times daily. 270 capsule 4  . pantoprazole (PROTONIX) 40 MG tablet Take 1 tablet (40 mg total) by mouth daily. 90 tablet 3  . triamcinolone cream (KENALOG) 0.1 % Apply 1 application topically 2 (two) times daily. 30 g 0  . Vitamin D, Ergocalciferol, (DRISDOL) 50000 units CAPS capsule Take 1 capsule (50,000 Units total) by mouth every 7 (seven) days. 12 capsule 3   No current facility-administered  medications on file prior to visit.     Allergies  Allergen Reactions  . Norvasc [Amlodipine] Nausea Only  . Pravastatin     myalgia    Objective:  BP 122/78   Pulse 75   Temp 97.5 F (36.4 C) (Oral)   Resp 18   Ht 5\' 6"  (1.676 m)   Wt 264 lb (119.7 kg)   SpO2 97%   BMI 42.61 kg/m   Physical Exam    Constitutional: She is oriented to person, place, and time and well-developed, well-nourished, and in no distress. No distress.  Cardiovascular: Normal rate, regular rhythm and normal heart sounds.   Pulmonary/Chest: No respiratory distress.  Neurological: She is alert and oriented to person, place, and time. GCS score is 15.  Skin: Skin is warm and dry.  Psychiatric: Mood, memory, affect and judgment normal.  Vitals reviewed.  Results for orders placed or performed in visit on 03/23/16  Lipid panel  Result Value Ref Range   Cholesterol 203 (H) 125 - 200 mg/dL   Triglycerides 69 <150 mg/dL   HDL 74 >=46 mg/dL   Total CHOL/HDL Ratio 2.7 <=5.0 Ratio   VLDL 14 <30 mg/dL   LDL Cholesterol 115 <130 mg/dL  COMPLETE METABOLIC PANEL WITH GFR  Result Value Ref Range   Sodium 140 135 - 146 mmol/L   Potassium 4.3 3.5 - 5.3 mmol/L   Chloride 98 98 - 110 mmol/L   CO2 27 20 - 31 mmol/L   Glucose, Bld 164 (H) 65 - 99 mg/dL   BUN 13 7 - 25 mg/dL   Creat 0.70 0.50 - 1.05 mg/dL   Total Bilirubin 0.6 0.2 - 1.2 mg/dL   Alkaline Phosphatase 54 33 - 130 U/L   AST 18 10 - 35 U/L   ALT 29 6 - 29 U/L   Total Protein 7.8 6.1 - 8.1 g/dL   Albumin 4.7 3.6 - 5.1 g/dL   Calcium 10.0 8.6 - 10.4 mg/dL   GFR, Est African American >89 >=60 mL/min   GFR, Est Non African American >89 >=60 mL/min  POCT glycosylated hemoglobin (Hb A1C)  Result Value Ref Range   Hemoglobin A1C 8.2   POCT urinalysis dipstick  Result Value Ref Range   Color, UA yellow yellow   Clarity, UA clear clear   Glucose, UA negative negative   Bilirubin, UA negative negative   Ketones, POC UA negative negative   Spec Grav, UA 1.025    Blood, UA negative negative   pH, UA 5.0    Protein Ur, POC negative negative   Urobilinogen, UA 0.2    Nitrite, UA Negative Negative   Leukocytes, UA Trace (A) Negative    Assessment and Plan :  Pt is a pleasant 55 year old female, history of hypertension, diabetes, pancreatic cystadenoma,  obesity, HLD, presenting to clinic for medication refill.  Doing well. Has been out of her Trulicity and metoprolol for one month.   Discussed with patient importance of diet and exercise. She plans to start training with a trainer at [employer] and eat a more healthy diet. Encouraged medication compliance and regular Ovs. RTC in 3 months   1. Essential hypertension - not elevated - metoprolol (TOPROL-XL) 200 MG 24 hr tablet; TAKE ONE TABLET BY MOUTH ONCE DAILY. TAKE WITH OR IMMEDIATELY FOLLOWING A MEAL  Dispense: 90 tablet; Refill: 0 - lisinopril-hydrochlorothiazide (PRINZIDE,ZESTORETIC) 20-12.5 MG tablet; Take 1 tablet by mouth daily.  Dispense: 90 tablet; Refill: 0  2. Hyperlipidemia - cholesterol  slightly elevated (203) - Lipid panel 3. Uncontrolled type 2 diabetes mellitus with diabetic polyneuropathy, without long-term current use of insulin ([facility]) - POCT glycosylated hemoglobin (Hb A1C) - COMPLETE METABOLIC PANEL WITH GFR - Dulaglutide (TRULICITY) A999333 0000000 SOPN; Inject 0.75 mg into the skin once a week.  Dispense: 12 pen; Refill: 1 - POCT urinalysis dipstick  4. Flu vaccine need - Flu Vaccine QUAD 36+ mos IM 5. Vitamin D deficiency - Vitamin D, Ergocalciferol, (DRISDOL) 50000 units CAPS capsule; Take 1 capsule (50,000 Units total) by mouth every 7 (seven) days.  Dispense: 12 capsule; Refill: 3 6. Gastroesophageal reflux disease without esophagitis - pantoprazole (PROTONIX) 40 MG tablet; Take 1 tablet (40 mg total) by mouth daily.  Dispense: 90 tablet; Refill: 3   Mercer Pod, PA-C  [facility] 03/23/2016 9:02 AM

## 2016-03-24 ENCOUNTER — Encounter: Payer: Self-pay | Admitting: Physician Assistant

## 2016-03-24 NOTE — Progress Notes (Signed)
Letter sent to patient. Cholesterol is slightly elevated, but nothing of concern. I believe this will correct itself as you are on a good track for eating a healthy diet and headed to [employer]. Your blood sugars and A1C are still high, however they are trending in the right direction! Keep up the good work.

## 2016-03-26 ENCOUNTER — Other Ambulatory Visit: Payer: Self-pay | Admitting: Physician Assistant

## 2016-03-26 DIAGNOSIS — I10 Essential (primary) hypertension: Secondary | ICD-10-CM

## 2016-03-28 ENCOUNTER — Other Ambulatory Visit: Payer: Self-pay

## 2016-03-28 DIAGNOSIS — I10 Essential (primary) hypertension: Secondary | ICD-10-CM

## 2016-03-28 MED ORDER — METOPROLOL SUCCINATE ER 200 MG PO TB24: ORAL_TABLET | ORAL | 0 refills | Status: DC

## 2016-06-30 ENCOUNTER — Other Ambulatory Visit: Payer: Self-pay | Admitting: Physician Assistant

## 2016-06-30 DIAGNOSIS — I10 Essential (primary) hypertension: Secondary | ICD-10-CM

## 2016-07-04 ENCOUNTER — Other Ambulatory Visit: Payer: Self-pay | Admitting: Physician Assistant

## 2016-07-04 DIAGNOSIS — I10 Essential (primary) hypertension: Secondary | ICD-10-CM

## 2016-08-23 ENCOUNTER — Other Ambulatory Visit: Payer: Self-pay | Admitting: Physician Assistant

## 2016-08-23 DIAGNOSIS — E1142 Type 2 diabetes mellitus with diabetic polyneuropathy: Secondary | ICD-10-CM

## 2016-08-23 DIAGNOSIS — IMO0002 Reserved for concepts with insufficient information to code with codable children: Secondary | ICD-10-CM

## 2016-08-23 DIAGNOSIS — E1165 Type 2 diabetes mellitus with hyperglycemia: Principal | ICD-10-CM

## 2016-10-15 ENCOUNTER — Ambulatory Visit (INDEPENDENT_AMBULATORY_CARE_PROVIDER_SITE_OTHER): Payer: BLUE CROSS/BLUE SHIELD | Admitting: Physician Assistant

## 2016-10-15 VITALS — BP 149/87 | HR 68 | Temp 98.4°F | Resp 18 | Ht 66.0 in | Wt 263.0 lb

## 2016-10-15 DIAGNOSIS — IMO0002 Reserved for concepts with insufficient information to code with codable children: Secondary | ICD-10-CM

## 2016-10-15 DIAGNOSIS — Z0000 Encounter for general adult medical examination without abnormal findings: Secondary | ICD-10-CM

## 2016-10-15 DIAGNOSIS — E1142 Type 2 diabetes mellitus with diabetic polyneuropathy: Secondary | ICD-10-CM | POA: Diagnosis not present

## 2016-10-15 DIAGNOSIS — I10 Essential (primary) hypertension: Secondary | ICD-10-CM | POA: Diagnosis not present

## 2016-10-15 DIAGNOSIS — E1165 Type 2 diabetes mellitus with hyperglycemia: Secondary | ICD-10-CM

## 2016-10-15 MED ORDER — LISINOPRIL-HYDROCHLOROTHIAZIDE 20-25 MG PO TABS: 1.0000 | ORAL_TABLET | Freq: Every day | ORAL | 1 refills | Status: DC

## 2016-10-15 MED ORDER — DULAGLUTIDE 0.75 MG/0.5ML ~~LOC~~ SOAJ: 0.7500 mg | SUBCUTANEOUS | 1 refills | Status: DC

## 2016-10-15 MED ORDER — METOPROLOL SUCCINATE ER 200 MG PO TB24: ORAL_TABLET | ORAL | 1 refills | Status: DC

## 2016-10-15 MED ORDER — METFORMIN HCL 1000 MG PO TABS: 1000.0000 mg | ORAL_TABLET | Freq: Two times a day (BID) | ORAL | 3 refills | Status: DC

## 2016-10-15 NOTE — Patient Instructions (Signed)
     IF you received an x-ray today, you will receive an invoice from [facility]. Please contact [facility] at [phone] with questions or concerns regarding your invoice.   IF you received labwork today, you will receive an invoice from [facility]. Please contact [facility] at [phone] with questions or concerns regarding your invoice.   Our billing staff will not be able to assist you with questions regarding bills from these companies.  You will be contacted with the lab results as soon as they are available. The fastest way to get your results is to activate your My Chart account. Instructions are located on the last page of this paperwork. If you have not heard from us regarding the results in 2 weeks, please contact this office.     

## 2016-10-15 NOTE — Progress Notes (Signed)
10/15/2016 3:39 PM   DOB: Dec 03, 1960 / MRN: [id number]  SUBJECTIVE:  [name] is a 56 y.o. female presenting for BP medication refills.  She takes metoprolol and lisinopril-HCTZ.  She has been taking these daily and has not missed any doses in the last two weeks. She does check her pressure at home and tells me it will run 150/60-80.   She has an 7 year history of diabetes. Does have some sharp pain in her feet from time that can be "stabbing." This pain is not associated with any activities. Denies any glove paresthesia. She is missing a few doses here and there of her metformin, last dose was 8-9 days ago. Tells me her sugar is averaging about 290 at home. She is not missing doses of her GLP-1.    She is taking pantoprazole every other day because she heard it caused osteoporosis. Her GERD is largely controlled symptomatically. Denies persistent cough, sore throat, brash. Denies dysphagia and odynophagia.   Health Maintenance Due  Topic  . Hepatitis C Screening   . HIV Screening   . PAP SMEAR   . PNEUMOCOCCAL POLYSACCHARIDE VACCINE (2)  . FOOT EXAM   . HEMOGLOBIN A1C   . OPHTHALMOLOGY EXAM    Immunization History  Administered Date(s) Administered  . HiB (PRP-OMP) 01/01/2011  . Influenza, Seasonal, Injecte, Preservative Fre 06/13/2012  . Influenza,inj,Quad PF,36+ Mos 06/17/2014, 03/23/2016  . Meningococcal Conjugate 01/01/2011  . Pneumococcal Polysaccharide-23 01/01/2011  . Tdap 09/20/2009      She is allergic to norvasc [amlodipine] and pravastatin.   She  has a past medical history of Abdominal pain; Allergy; Anemia; Cancer ([place]); Costochondritis; Diabetes mellitus; GERD (gastroesophageal reflux disease); Hyperlipidemia; Hypertension; and Numbness of fingers.    She  reports that she quit smoking about 34 years ago. She has never used smokeless tobacco. She reports that she drinks about 0.5 oz of alcohol per week . She reports that she does not use drugs. She  reports  that she currently engages in sexual activity and has had female partners. The patient  has a past surgical history that includes Cholecystectomy; Splenectomy, partial (01/09/2011); Pancreatectomy (01/09/2011); Cesarean section; Cystectomy; Breast biopsy; and Abdominal hysterectomy.  Her family history includes Cancer (age of onset: 73) in her mother; Diabetes in her mother; Heart disease in her father; Stroke (age of onset: 53) in her mother.  Review of Systems  Constitutional: Negative for diaphoresis.  Respiratory: Negative for cough, hemoptysis, sputum production, shortness of breath and wheezing.   Cardiovascular: Negative for chest pain, orthopnea and leg swelling.  Gastrointestinal: Negative for nausea.  Neurological: Negative for dizziness.    The problem list and medications were reviewed and updated by myself where necessary and exist elsewhere in the encounter.   OBJECTIVE:  BP (!) 149/87   Pulse 68   Temp 98.4 F (36.9 C) (Oral)   Resp 18   Ht _0  (1.676 m)   Wt 263 lb (119.3 kg)   SpO2 96%   BMI 42.45 kg/m   Wt Readings from Last 3 Encounters:  10/15/16 263 lb (119.3 kg)  03/23/16 264 lb (119.7 kg)  01/09/16 264 lb (119.7 kg)     Physical Exam  Constitutional: She is oriented to person, place, and time. She is active.  Cardiovascular: Normal rate, regular rhythm, S1 normal, S2 normal, normal heart sounds and intact distal pulses.  Exam reveals no gallop, no friction rub and no decreased pulses.   No murmur heard. Pulmonary/Chest: Effort normal.  No stridor. No tachypnea. No respiratory distress. She has no wheezes. She has no rales.  Abdominal: She exhibits no distension.  Musculoskeletal: She exhibits no edema.  Neurological: She is alert and oriented to person, place, and time. No cranial nerve deficit. Coordination normal.  Skin: Skin is warm.   Lab Results  Component Value Date   WBC 11.0 (A) 04/03/2015   HGB 12.7 04/03/2015   HCT 42.3 04/03/2015   MCV  77.8 (A) 04/03/2015   PLT 592 (H) 02/13/2011    Lab Results  Component Value Date   NA 140 03/23/2016   K 4.3 03/23/2016   CL 98 03/23/2016   CO2 27 03/23/2016    Lab Results  Component Value Date   CREATININE 0.70 03/23/2016    Lab Results  Component Value Date   ALT 29 03/23/2016   AST 18 03/23/2016   ALKPHOS 54 03/23/2016   BILITOT 0.6 03/23/2016    Lab Results  Component Value Date   TSH 2.974 08/08/2013    Lab Results  Component Value Date   HGBA1C 8.2 03/23/2016   Lab Results  Component Value Date   MICROALBUR 1.19 11/14/2011     ASSESSMENT AND PLAN:  Sharunda was seen today for medication refill.  Diagnoses and all orders for this visit:  Essential hypertension: Poorly controlled. Asymptomatic today.   Increasing HCTZ component. Will see her back in one month.   -     lisinopril-hydrochlorothiazide (PRINZIDE,ZESTORETIC) 20-25 MG tablet; Take 1 tablet by mouth daily. -     metoprolol (TOPROL-XL) 200 MG 24 hr tablet; TAKE ONE TABLET BY MOUTH ONCE DAILY. TAKE WITH OR IMMEDIATELY FOLLOWING A MEAL -     CMP14+EGFR  Uncontrolled type 2 diabetes mellitus with diabetic polyneuropathy, without long-term current use of insulin ([place]): Will check an a1c today so I can have a trajectory for medication titration in 30 days.  -     metFORMIN (GLUCOPHAGE) 1000 MG tablet; Take 1 tablet (1,000 mg total) by mouth 2 (two) times daily with a meal. -     Dulaglutide (TRULICITY) 4.82 LM/7.8ML SOPN; Inject 0.75 mg into the skin once a week. -     Hemoglobin A1c    The patient is advised to call or return to clinic if she does not see an improvement in symptoms, or to seek the care of the closest emergency department if she worsens with the above plan.   Philis Fendt, MHS, PA-C [facility] 10/15/2016 3:39 PM

## 2016-10-16 LAB — HIV ANTIBODY (ROUTINE TESTING W REFLEX): HIV Screen 4th Generation wRfx: NONREACTIVE

## 2016-10-16 LAB — CMP14+EGFR
ALT: 36 IU/L — ABNORMAL HIGH (ref 0–32)
AST: 24 IU/L (ref 0–40)
Albumin/Globulin Ratio: 1.6 (ref 1.2–2.2)
Albumin: 4.5 g/dL (ref 3.5–5.5)
Alkaline Phosphatase: 81 IU/L (ref 39–117)
BUN/Creatinine Ratio: 14 (ref 9–23)
BUN: 10 mg/dL (ref 6–24)
Bilirubin Total: 0.3 mg/dL (ref 0.0–1.2)
CO2: 26 mmol/L (ref 18–29)
Calcium: 9.7 mg/dL (ref 8.7–10.2)
Chloride: 92 mmol/L — ABNORMAL LOW (ref 96–106)
Creatinine, Ser: 0.7 mg/dL (ref 0.57–1.00)
GFR calc Af Amer: 113 mL/min/{1.73_m2} (ref >59)
GFR calc non Af Amer: 98 mL/min/{1.73_m2} (ref >59)
Globulin, Total: 2.8 g/dL (ref 1.5–4.5)
Glucose: 316 mg/dL — ABNORMAL HIGH (ref 65–99)
Potassium: 4.2 mmol/L (ref 3.5–5.2)
Sodium: 133 mmol/L — ABNORMAL LOW (ref 134–144)
Total Protein: 7.3 g/dL (ref 6.0–8.5)

## 2016-10-16 LAB — HEMOGLOBIN A1C
Est. average glucose Bld gHb Est-mCnc: 269 mg/dL
Hgb A1c MFr Bld: 11 % — ABNORMAL HIGH (ref 4.8–5.6)

## 2016-10-16 LAB — HEPATITIS C ANTIBODY: Hep C Virus Ab: <0.1 s/co ratio (ref 0.0–0.9)

## 2016-11-11 ENCOUNTER — Encounter: Payer: Self-pay | Admitting: Physician Assistant

## 2016-11-11 ENCOUNTER — Ambulatory Visit (INDEPENDENT_AMBULATORY_CARE_PROVIDER_SITE_OTHER): Payer: BLUE CROSS/BLUE SHIELD | Admitting: Physician Assistant

## 2016-11-11 VITALS — BP 135/79 | HR 80 | Temp 97.9°F | Resp 16 | Ht 66.0 in | Wt 256.0 lb

## 2016-11-11 DIAGNOSIS — IMO0002 Reserved for concepts with insufficient information to code with codable children: Secondary | ICD-10-CM

## 2016-11-11 DIAGNOSIS — E1142 Type 2 diabetes mellitus with diabetic polyneuropathy: Secondary | ICD-10-CM | POA: Diagnosis not present

## 2016-11-11 DIAGNOSIS — E1165 Type 2 diabetes mellitus with hyperglycemia: Secondary | ICD-10-CM | POA: Diagnosis not present

## 2016-11-11 LAB — POCT GLYCOSYLATED HEMOGLOBIN (HGB A1C): Hemoglobin A1C: 11.6

## 2016-11-11 MED ORDER — INSULIN PEN NEEDLE 30G X 8 MM MISC: 1.0000 | 1 refills | Status: DC | PRN

## 2016-11-11 MED ORDER — INSULIN GLARGINE 100 UNIT/ML SOLOSTAR PEN: 10.0000 [IU] | PEN_INJECTOR | Freq: Every day | SUBCUTANEOUS | 0 refills | Status: DC

## 2016-11-11 NOTE — Progress Notes (Signed)
11/12/2016 8:25 AM   DOB: Feb 04, 1961 / MRN: [id number]  SUBJECTIVE:  [name] is a 56 y.o. female presenting for A1c and BP recheck. Last I saw her, her A1c was acutely worse.  She had been missing doses of metformin at that time and eating poorly.  She decided that she wanted to come back in one month after recommitting to weight loss and diet and medication compliance.   Today she tells me that she has been watching her diet closely with respect to sugars and carbohydrates, but did have "a few bad eating days." She has not been exericising.  CHL shows that she has lost 7 lbs. She is okay with starting insulin today.     She is allergic to norvasc [amlodipine] and pravastatin.   She  has a past medical history of Abdominal pain; Allergy; Anemia; Cancer ([place]); Costochondritis; Diabetes mellitus; GERD (gastroesophageal reflux disease); Hyperlipidemia; Hypertension; and Numbness of fingers.    She  reports that she quit smoking about 34 years ago. She has never used smokeless tobacco. She reports that she drinks about 0.5 oz of alcohol per week . She reports that she does not use drugs. She  reports that she currently engages in sexual activity and has had female partners. The patient  has a past surgical history that includes Cholecystectomy; Splenectomy, partial (01/09/2011); Pancreatectomy (01/09/2011); Cesarean section; Cystectomy; Breast biopsy; and Abdominal hysterectomy.  Her family history includes Cancer (age of onset: 9) in her mother; Diabetes in her mother; Heart disease in her father; Stroke (age of onset: 69) in her mother.  Review of Systems  Constitutional: Negative for fever.  Gastrointestinal: Negative for nausea.  Neurological: Negative for dizziness.  Endo/Heme/Allergies: Negative for polydipsia.    The problem list and medications were reviewed and updated by myself where necessary and exist elsewhere in the encounter.   OBJECTIVE:  BP 135/79   Pulse 80   Temp 97.9 F  (36.6 C) (Oral)   Resp 16   Ht 5\' 6"  (1.676 m)   Wt 256 lb (116.1 kg)   SpO2 98%   BMI 41.32 kg/m   Wt Readings from Last 3 Encounters:  11/11/16 256 lb (116.1 kg)  10/15/16 263 lb (119.3 kg)  03/23/16 264 lb (119.7 kg)     Physical Exam  Constitutional: She is active.  Non-toxic appearance.  Cardiovascular: Normal rate.   Pulmonary/Chest: Effort normal. No tachypnea.  Musculoskeletal: Normal range of motion.  Neurological: She is alert.  Skin: Skin is warm and dry. She is not diaphoretic. No pallor.    Results for orders placed or performed in visit on 11/11/16 (from the past 72 hour(s))  POCT glycosylated hemoglobin (Hb A1C)     Status: None   Collection Time: 11/11/16  3:48 PM  Result Value Ref Range   Hemoglobin A1C 11.6     No results found.  ASSESSMENT AND PLAN:  [name] was seen today for follow-up.  Diagnoses and all orders for this visit:  Uncontrolled type 2 diabetes mellitus with diabetic polyneuropathy, without long-term current use of insulin ([place]): HTN has improved.  Diabetes largely unchanged with respect to A1c.  Starting 10 of lantus nightly with one month follow up.  She will keep a fasting CBG diary. -     POCT glycosylated hemoglobin (Hb A1C) -     Insulin Glargine (LANTUS SOLOSTAR) 100 UNIT/ML Solostar Pen; Inject 10 Units into the skin daily at 10 pm. -     Insulin Pen Needle (  NOVOFINE) 30G X 8 MM MISC; Inject 10 each into the skin as needed. Dx E11.42    The patient is advised to call or return to clinic if she does not see an improvement in symptoms, or to seek the care of the closest emergency department if she worsens with the above plan.   Philis Fendt, MHS, PA-C [facility] 11/12/2016 8:25 AM

## 2016-11-11 NOTE — Patient Instructions (Addendum)
Come back in about one month for recheck and bring your fastin sugar diary with you at that time.     IF you received an x-ray today, you will receive an invoice from [facility]. Please contact [facility] at [phone] with questions or concerns regarding your invoice.   IF you received labwork today, you will receive an invoice from [place]. Please contact [facility] at [phone] with questions or concerns regarding your invoice.   Our billing staff will not be able to assist you with questions regarding bills from these companies.  You will be contacted with the lab results as soon as they are available. The fastest way to get your results is to activate your My Chart account. Instructions are located on the last page of this paperwork. If you have not heard from [place] regarding the results in 2 weeks, please contact this office.

## 2016-11-18 ENCOUNTER — Ambulatory Visit (INDEPENDENT_AMBULATORY_CARE_PROVIDER_SITE_OTHER): Payer: BLUE CROSS/BLUE SHIELD | Admitting: Physician Assistant

## 2016-11-18 ENCOUNTER — Encounter: Payer: Self-pay | Admitting: Physician Assistant

## 2016-11-18 VITALS — BP 151/79 | HR 70 | Temp 97.7°F | Resp 18 | Ht 65.95 in | Wt 260.6 lb

## 2016-11-18 DIAGNOSIS — M1711 Unilateral primary osteoarthritis, right knee: Secondary | ICD-10-CM | POA: Diagnosis not present

## 2016-11-18 DIAGNOSIS — Z299 Encounter for prophylactic measures, unspecified: Secondary | ICD-10-CM

## 2016-11-18 MED ORDER — ASPIRIN EC 81 MG PO TBEC: 81.0000 mg | DELAYED_RELEASE_TABLET | Freq: Every day | ORAL | 0 refills | Status: DC

## 2016-11-18 MED ORDER — TRAMADOL HCL 50 MG PO TABS: 50.0000 mg | ORAL_TABLET | Freq: Three times a day (TID) | ORAL | 0 refills | Status: DC | PRN

## 2016-11-18 NOTE — Patient Instructions (Addendum)
Take 2 tylenol every 8 hours as needed for pain.     USP certified glucosamine and condrotin would be fine.  Tart, unsweetened cherry juice would be worth a try.  Tumeric is also a good idea. Talk to Donella Stade [facility] as she may be more familiar with supplements.

## 2016-11-18 NOTE — Progress Notes (Signed)
11/23/2016 8:47 AM   DOB: 28-Sep-1960 / MRN: [id number]  SUBJECTIVE:  [name] is a 56 y.o. female presenting for worsening right knee pain. She has a history of arthritis in the joint.  Tells me that she has been having quite a lot more pain in the last week or so.  Has tried ice, tylenol with some relief. Feels that she is getting worse.  Does have rads in [facility] showing OA of the affected joint.   Allergies  Allergen Reactions  . Norvasc [Amlodipine] Nausea Only  . Pravastatin     myalgia     She  has a past medical history of Abdominal pain; Allergy; Anemia; Cancer ([place]); Costochondritis; Diabetes mellitus; GERD (gastroesophageal reflux disease); Hyperlipidemia; Hypertension; and Numbness of fingers.    She  reports that she quit smoking about 34 years ago. She has never used smokeless tobacco. She reports that she drinks about 0.5 oz of alcohol per week . She reports that she does not use drugs. She  reports that she currently engages in sexual activity and has had female partners. The patient  has a past surgical history that includes Cholecystectomy; Splenectomy, partial (01/09/2011); Pancreatectomy (01/09/2011); Cesarean section; Cystectomy; Breast biopsy; and Abdominal hysterectomy.  Her family history includes Cancer (age of onset: 75) in her mother; Diabetes in her mother; Heart disease in her father; Stroke (age of onset: 17) in her mother.  Review of Systems  Constitutional: Negative for chills, diaphoresis and fever.  Eyes: Negative.   Respiratory: Negative for shortness of breath.   Cardiovascular: Negative for chest pain, orthopnea and leg swelling.  Gastrointestinal: Negative for nausea.  Musculoskeletal: Positive for joint pain. Negative for back pain, falls, myalgias and neck pain.  Skin: Negative for rash.  Neurological: Negative for dizziness, sensory change, speech change, focal weakness and headaches.    The problem list and medications were reviewed and updated by  myself where necessary and exist elsewhere in the encounter.   OBJECTIVE:  BP (!) 151/79   Pulse 70   Temp 97.7 F (36.5 C) (Oral)   Resp 18   Ht 5' 5.95" (1.675 m)   Wt 260 lb 9.6 oz (118.2 kg)   SpO2 97%   BMI 42.13 kg/m   BP Readings from Last 3 Encounters:  11/18/16 (!) 151/79  11/11/16 135/79  10/15/16 (!) 149/87   Wt Readings from Last 3 Encounters:  11/18/16 260 lb 9.6 oz (118.2 kg)  11/11/16 256 lb (116.1 kg)  10/15/16 263 lb (119.3 kg)      Physical Exam  Constitutional: She is active.  Non-toxic appearance.  Cardiovascular: Normal rate.   Pulmonary/Chest: Effort normal. No tachypnea.  Musculoskeletal: Normal range of motion. She exhibits tenderness (Joint line tenderness about the right knee.  Minimal swellling.  Negative for erthema and wrmth.  No deformity. ).  Neurological: She is alert.  Skin: Skin is warm and dry. She is not diaphoretic. No pallor.    No results found for this or any previous visit (from the past 72 hour(s)).  No results found.  ASSESSMENT AND PLAN:  [name] was seen today for leg pain.  Diagnoses and all orders for this visit:  Primary osteoarthritis of right knee: Preexisting with likely flare.  Advised she continue tylenol, investigate water aerobics, and use tramadol for bad days. She has a DM follow up coming up and we can revisit this at that time.  -     traMADol (ULTRAM) 50 MG tablet; Take 1 tablet (  50 mg total) by mouth every 8 (eight) hours as needed.  Need for prophylactic measure: Given uncontrolled diabetes ASA benefits outweight risk.  -     aspirin EC 81 MG tablet; Take 1 tablet (81 mg total) by mouth daily.    The patient is advised to call or return to clinic if she does not see an improvement in symptoms, or to seek the care of the closest emergency department if she worsens with the above plan.   [name], MHS, PA-C [facility] 11/23/2016 8:47 AM

## 2016-11-26 ENCOUNTER — Other Ambulatory Visit: Payer: Self-pay | Admitting: Physician Assistant

## 2016-11-26 DIAGNOSIS — Z1231 Encounter for screening mammogram for malignant neoplasm of breast: Secondary | ICD-10-CM

## 2016-12-04 ENCOUNTER — Other Ambulatory Visit: Payer: Self-pay | Admitting: Physician Assistant

## 2016-12-04 DIAGNOSIS — Z1231 Encounter for screening mammogram for malignant neoplasm of breast: Secondary | ICD-10-CM

## 2016-12-05 ENCOUNTER — Ambulatory Visit: Payer: BLUE CROSS/BLUE SHIELD

## 2016-12-16 ENCOUNTER — Ambulatory Visit: Payer: BLUE CROSS/BLUE SHIELD | Admitting: Physician Assistant

## 2016-12-17 ENCOUNTER — Ambulatory Visit
Admission: RE | Admit: 2016-12-17 | Discharge: 2016-12-17 | Disposition: A | Discharge status: Home / Self Care | Payer: BLUE CROSS/BLUE SHIELD | Source: Ambulatory Visit | Attending: Physician Assistant | Admitting: Physician Assistant

## 2016-12-17 DIAGNOSIS — Z1231 Encounter for screening mammogram for malignant neoplasm of breast: Secondary | ICD-10-CM | POA: Diagnosis not present

## 2017-01-05 ENCOUNTER — Other Ambulatory Visit: Payer: Self-pay | Admitting: Physician Assistant

## 2017-01-05 DIAGNOSIS — E1165 Type 2 diabetes mellitus with hyperglycemia: Principal | ICD-10-CM

## 2017-01-05 DIAGNOSIS — E1142 Type 2 diabetes mellitus with diabetic polyneuropathy: Secondary | ICD-10-CM

## 2017-01-05 DIAGNOSIS — IMO0002 Reserved for concepts with insufficient information to code with codable children: Secondary | ICD-10-CM

## 2017-01-06 NOTE — Telephone Encounter (Signed)
[place]: Please review. I will go ahead and refill since we are both seeing her. I did start her on insulin.   [place]: Please ensure that we have follow up in place for this patient.  Given that I recently started her on insulin she is probably due back for a follow up. Philis Fendt, MS, PA-C 9:23 AM, 01/06/2017    Lab Results  Component Value Date   HGBA1C 11.6 11/11/2016   Lab Results  Component Value Date   WBC 11.0 (A) 04/03/2015   HGB 12.7 04/03/2015   HCT 42.3 04/03/2015   MCV 77.8 (A) 04/03/2015   PLT 592 (H) 02/13/2011    Lab Results  Component Value Date   NA 133 (L) 10/15/2016   K 4.2 10/15/2016   CL 92 (L) 10/15/2016   CO2 26 10/15/2016    Lab Results  Component Value Date   CREATININE 0.70 10/15/2016    Lab Results  Component Value Date   ALT 36 (H) 10/15/2016   AST 24 10/15/2016   ALKPHOS 81 10/15/2016   BILITOT 0.3 10/15/2016    Lab Results  Component Value Date   TSH 2.974 08/08/2013    Lab Results  Component Value Date   HGBA1C 11.6 11/11/2016    Lab Results  Component Value Date   CHOL 203 (H) 03/23/2016   HDL 74 03/23/2016   LDLCALC 115 03/23/2016   TRIG 69 03/23/2016   CHOLHDL 2.7 03/23/2016

## 2017-01-08 NOTE — Telephone Encounter (Signed)
Call placed to patient to make her aware of Rx and need for follow up. Patient verbalized understanding, call transferred to front desk to make follow up appointment./ S.Cataleya Cristina,CMA

## 2017-01-27 ENCOUNTER — Ambulatory Visit (INDEPENDENT_AMBULATORY_CARE_PROVIDER_SITE_OTHER): Payer: BLUE CROSS/BLUE SHIELD | Admitting: Physician Assistant

## 2017-01-27 ENCOUNTER — Encounter: Payer: Self-pay | Admitting: Physician Assistant

## 2017-01-27 VITALS — BP 118/82 | HR 65 | Temp 98.1°F | Resp 16 | Ht 65.9 in | Wt 251.4 lb

## 2017-01-27 DIAGNOSIS — E1165 Type 2 diabetes mellitus with hyperglycemia: Secondary | ICD-10-CM | POA: Diagnosis not present

## 2017-01-27 DIAGNOSIS — E1142 Type 2 diabetes mellitus with diabetic polyneuropathy: Secondary | ICD-10-CM

## 2017-01-27 DIAGNOSIS — IMO0002 Reserved for concepts with insufficient information to code with codable children: Secondary | ICD-10-CM

## 2017-01-27 NOTE — Progress Notes (Signed)
01/31/2017 12:21 PM   DOB: [birth year] / MRN: [id number]  SUBJECTIVE:  [name] is a 56 y.o. female presenting for diabetes recheck. She has been checking her glucose daily and tells me it is averaging roughly 150 daily.  She is taking 10 units of insulin nightly.  She is working hard to lose weight and has been modifying her diet and counting her carbs.  She is allergic to norvasc [amlodipine] and pravastatin.   She  has a past medical history of Abdominal pain; Allergy; Anemia; Cancer ([place]); Costochondritis; Diabetes mellitus; GERD (gastroesophageal reflux disease); Hyperlipidemia; Hypertension; and Numbness of fingers.    She  reports that she quit smoking about 35 years ago. She has never used smokeless tobacco. She reports that she drinks about 0.5 oz of alcohol per week . She reports that she does not use drugs. She  reports that she currently engages in sexual activity and has had female partners. The patient  has a past surgical history that includes Cholecystectomy; Splenectomy, partial (01/09/2011); Pancreatectomy (01/09/2011); Cesarean section; Cystectomy; Breast biopsy; Abdominal hysterectomy; and Breast excisional biopsy ([place]).  Her family history includes Cancer (age of onset: 71) in her mother; Diabetes in her mother; Heart disease in her father; Stroke (age of onset: 12) in her mother.  Review of Systems  Constitutional: Negative for chills, diaphoresis and fever.  Eyes: Negative.   Respiratory: Negative for cough, hemoptysis, sputum production, shortness of breath and wheezing.   Cardiovascular: Negative for chest pain, orthopnea and leg swelling.  Gastrointestinal: Negative for nausea.  Skin: Negative for rash.  Neurological: Negative for dizziness, sensory change, speech change, focal weakness and headaches.      The problem list and medications were reviewed and updated by myself where necessary and exist elsewhere in the encounter.   OBJECTIVE:  BP 118/82 (BP  Location: Right Arm, Patient Position: Sitting, Cuff Size: Large)   Pulse 65   Temp 98.1 F (36.7 C) (Oral)   Resp 16   Ht 5' 5.9" (1.674 m)   Wt 251 lb 6.4 oz (114 kg)   SpO2 99%   BMI 40.70 kg/m   Physical Exam  Constitutional: She is active.  Non-toxic appearance.  Cardiovascular: Normal rate, regular rhythm, S1 normal, S2 normal, normal heart sounds and intact distal pulses.  Exam reveals no gallop, no friction rub and no decreased pulses.   No murmur heard. Pulmonary/Chest: Effort normal. No stridor. No tachypnea. No respiratory distress. She has no wheezes. She has no rales.  Abdominal: She exhibits no distension.  Musculoskeletal: She exhibits no edema.  Neurological: She is alert.  Skin: Skin is warm and dry. She is not diaphoretic. No pallor.    Lab Results  Component Value Date   HGBA1C 11.6 11/11/2016   Lab Results  Component Value Date   CHOL 203 (H) 03/23/2016   HDL 74 03/23/2016   LDLCALC 115 03/23/2016   TRIG 69 03/23/2016   CHOLHDL 2.7 03/23/2016   Wt Readings from Last 3 Encounters:  01/27/17 251 lb 6.4 oz (114 kg)  11/18/16 260 lb 9.6 oz (118.2 kg)  11/11/16 256 lb (116.1 kg)      No results found for this or any previous visit (from the past 47 hour(s)).  No results found.  ASSESSMENT AND PLAN:  [name] was seen today for follow-up.  Diagnoses and all orders for this visit:  Uncontrolled type 2 diabetes mellitus with diabetic polyneuropathy, without long-term current use of insulin [facility]): Based on her  fasting glucose numbers her A1c is estimated at roughly 7%.  This is a drastic improvement.  She is losing weight.  I have tried to encourage her and am holding off on an A1c today.  Will see her back in three months. -     Cancel: POCT glycosylated hemoglobin (Hb A1C)    The patient is advised to call or return to clinic if she does not see an improvement in symptoms, or to seek the care of the closest emergency department if she worsens with  the above plan.   Philis Fendt, MHS, PA-C [facility] 01/31/2017 12:21 PM

## 2017-01-27 NOTE — Patient Instructions (Addendum)
  I would like for you to weight 9 lbs less than today at our visit in three months.  KEEP UP THE GREAT WORK I AM PROUD OF YOU.   IF you received an x-ray today, you will receive an invoice from [facility]. Please contact [facility] at [phone] with questions or concerns regarding your invoice.   IF you received labwork today, you will receive an invoice from [facility]. Please contact [facility] at [phone] with questions or concerns regarding your invoice.   Our billing staff will not be able to assist you with questions regarding bills from these companies.  You will be contacted with the lab results as soon as they are available. The fastest way to get your results is to activate your My Chart account. Instructions are located on the last page of this paperwork. If you have not heard from [place] regarding the results in 2 weeks, please contact this office.

## 2017-03-13 ENCOUNTER — Other Ambulatory Visit: Payer: Self-pay | Admitting: Physician Assistant

## 2017-03-13 DIAGNOSIS — E1165 Type 2 diabetes mellitus with hyperglycemia: Principal | ICD-10-CM

## 2017-03-13 DIAGNOSIS — E1142 Type 2 diabetes mellitus with diabetic polyneuropathy: Secondary | ICD-10-CM

## 2017-03-13 DIAGNOSIS — IMO0002 Reserved for concepts with insufficient information to code with codable children: Secondary | ICD-10-CM

## 2017-04-03 ENCOUNTER — Other Ambulatory Visit: Payer: Self-pay | Admitting: Physician Assistant

## 2017-04-03 DIAGNOSIS — E1165 Type 2 diabetes mellitus with hyperglycemia: Principal | ICD-10-CM

## 2017-04-03 DIAGNOSIS — IMO0002 Reserved for concepts with insufficient information to code with codable children: Secondary | ICD-10-CM

## 2017-04-03 DIAGNOSIS — E1142 Type 2 diabetes mellitus with diabetic polyneuropathy: Secondary | ICD-10-CM

## 2017-04-08 DIAGNOSIS — E119 Type 2 diabetes mellitus without complications: Secondary | ICD-10-CM | POA: Diagnosis not present

## 2017-04-08 DIAGNOSIS — H2513 Age-related nuclear cataract, bilateral: Secondary | ICD-10-CM | POA: Diagnosis not present

## 2017-04-08 DIAGNOSIS — H11153 Pinguecula, bilateral: Secondary | ICD-10-CM | POA: Diagnosis not present

## 2017-04-08 DIAGNOSIS — H35033 Hypertensive retinopathy, bilateral: Secondary | ICD-10-CM | POA: Diagnosis not present

## 2017-04-08 LAB — HM DIABETES EYE EXAM

## 2017-05-03 ENCOUNTER — Other Ambulatory Visit: Payer: Self-pay | Admitting: Physician Assistant

## 2017-05-03 DIAGNOSIS — I10 Essential (primary) hypertension: Secondary | ICD-10-CM

## 2017-05-05 ENCOUNTER — Encounter: Payer: Self-pay | Admitting: Physician Assistant

## 2017-05-05 ENCOUNTER — Ambulatory Visit (INDEPENDENT_AMBULATORY_CARE_PROVIDER_SITE_OTHER): Payer: BLUE CROSS/BLUE SHIELD | Admitting: Physician Assistant

## 2017-05-05 VITALS — BP 150/90 | HR 69 | Resp 16 | Ht 65.9 in | Wt 245.2 lb

## 2017-05-05 DIAGNOSIS — E119 Type 2 diabetes mellitus without complications: Secondary | ICD-10-CM

## 2017-05-05 DIAGNOSIS — I10 Essential (primary) hypertension: Secondary | ICD-10-CM | POA: Diagnosis not present

## 2017-05-05 DIAGNOSIS — D696 Thrombocytopenia, unspecified: Secondary | ICD-10-CM | POA: Diagnosis not present

## 2017-05-05 DIAGNOSIS — Z299 Encounter for prophylactic measures, unspecified: Secondary | ICD-10-CM | POA: Diagnosis not present

## 2017-05-05 DIAGNOSIS — Z794 Long term (current) use of insulin: Secondary | ICD-10-CM

## 2017-05-05 DIAGNOSIS — Z23 Encounter for immunization: Secondary | ICD-10-CM

## 2017-05-05 DIAGNOSIS — K219 Gastro-esophageal reflux disease without esophagitis: Secondary | ICD-10-CM

## 2017-05-05 LAB — POCT GLYCOSYLATED HEMOGLOBIN (HGB A1C): Hemoglobin A1C: 6.7

## 2017-05-05 MED ORDER — ASPIRIN EC 81 MG PO TBEC: 81.0000 mg | DELAYED_RELEASE_TABLET | Freq: Every day | ORAL | 0 refills | Status: AC

## 2017-05-05 MED ORDER — METOPROLOL SUCCINATE ER 200 MG PO TB24: ORAL_TABLET | ORAL | 1 refills | Status: DC

## 2017-05-05 MED ORDER — PANTOPRAZOLE SODIUM 40 MG PO TBEC: 40.0000 mg | DELAYED_RELEASE_TABLET | Freq: Every day | ORAL | 3 refills | Status: DC

## 2017-05-05 MED ORDER — LISINOPRIL-HYDROCHLOROTHIAZIDE 20-25 MG PO TABS: 1.0000 | ORAL_TABLET | Freq: Every day | ORAL | 1 refills | Status: DC

## 2017-05-05 NOTE — Patient Instructions (Signed)
     IF you received an x-ray today, you will receive an invoice from [facility]. Please contact [facility] at [phone] with questions or concerns regarding your invoice.   IF you received labwork today, you will receive an invoice from [facility]. Please contact [facility] at [phone] with questions or concerns regarding your invoice.   Our billing staff will not be able to assist you with questions regarding bills from these companies.  You will be contacted with the lab results as soon as they are available. The fastest way to get your results is to activate your My Chart account. Instructions are located on the last page of this paperwork. If you have not heard from us regarding the results in 2 weeks, please contact this office.     

## 2017-05-05 NOTE — Progress Notes (Signed)
05/07/2017 9:28 AM   DOB: 04/17/1961 / MRN: [id number]  SUBJECTIVE:  [name] is a 56 y.o. female presenting for diabetes recheck.  Tells me that she is checking in the morning and the highest she has seen is 140. Has seen 112 as the lowest. She is getting diarrhea about 4 days a week.  Has started eating cauliflower rice.  Is substituing several other foods and has lost 15 lbs. She would like refills of her metoprolol and prinzide.  She has a stain induced myalgia twice now.   Immunization History  Administered Date(s) Administered  . HiB (PRP-OMP) 01/01/2011  . Influenza, Seasonal, Injecte, Preservative Fre 06/13/2012  . Influenza,inj,Quad PF,6+ Mos 06/17/2014, 03/23/2016, 05/05/2017  . Meningococcal Conjugate 01/01/2011  . Pneumococcal Conjugate-13 05/05/2017  . Pneumococcal Polysaccharide-23 01/01/2011  . Tdap 09/20/2009    She is allergic to norvasc [amlodipine] and pravastatin.   She  has a past medical history of Abdominal pain; Allergy; Anemia; Cancer ([place]); Costochondritis; Diabetes mellitus; GERD (gastroesophageal reflux disease); Hyperlipidemia; Hypertension; and Numbness of fingers.    She  reports that she quit smoking about 35 years ago. She has never used smokeless tobacco. She reports that she drinks about 0.5 oz of alcohol per week . She reports that she does not use drugs. She  reports that she currently engages in sexual activity and has had female partners. The patient  has a past surgical history that includes Cholecystectomy; Splenectomy, partial (01/09/2011); Pancreatectomy (01/09/2011); Cesarean section; Cystectomy; Breast biopsy; Abdominal hysterectomy; and Breast excisional biopsy (Left).  Her family history includes Cancer (age of onset: 72) in her mother; Diabetes in her mother; Heart disease in her father; Stroke (age of onset: 47) in her mother.  Review of Systems  Constitutional: Negative for chills, diaphoresis and fever.  Respiratory: Negative for  cough, hemoptysis, sputum production, shortness of breath and wheezing.   Cardiovascular: Negative for chest pain, orthopnea and leg swelling.  Gastrointestinal: Negative for abdominal pain, blood in stool, constipation, diarrhea, heartburn, melena, nausea and vomiting.  Genitourinary: Negative for flank pain.  Skin: Negative for rash.  Neurological: Negative for dizziness.    The problem list and medications were reviewed and updated by myself where necessary and exist elsewhere in the encounter.   OBJECTIVE:  BP (!) 150/90 (BP Location: Left Arm, Patient Position: Sitting, Cuff Size: Large)   Pulse 69   Resp 16   Ht 5' 5.9" (1.674 m)   Wt 245 lb 3.2 oz (111.2 kg)   SpO2 99%   BMI 39.70 kg/m   Wt Readings from Last 3 Encounters:  05/05/17 245 lb 3.2 oz (111.2 kg)  01/27/17 251 lb 6.4 oz (114 kg)  11/18/16 260 lb 9.6 oz (118.2 kg)   BP Readings from Last 3 Encounters:  05/05/17 (!) 150/90  01/27/17 118/82  11/18/16 (!) 151/79   The 10-year ASCVD risk score Mikey Bussing DC Jr., et al., 2013) is: 14.8%   Values used to calculate the score:     Age: 30 years     Sex: Female     Is Non-Hispanic African American: Yes     Diabetic: Yes     Tobacco smoker: No     Systolic Blood Pressure: 767 mmHg     Is BP treated: Yes     HDL Cholesterol: 74 mg/dL     Total Cholesterol: 203 mg/dL   Physical Exam  Constitutional: She is active.  Non-toxic appearance.  Cardiovascular: Normal rate.   Pulmonary/Chest: Effort  normal. No tachypnea.  Neurological: She is alert.  Skin: Skin is warm and dry. She is not diaphoretic. No pallor.    Results for orders placed or performed in visit on 05/05/17 (from the past 72 hour(s))  POCT glycosylated hemoglobin (Hb A1C)     Status: None   Collection Time: 05/05/17  4:37 PM  Result Value Ref Range   Hemoglobin A1C 6.7   CBC with Differential/Platelet     Status: Abnormal   Collection Time: 05/05/17  5:23 PM  Result Value Ref Range   WBC 11.6 (H)  3.4 - 10.8 x10E3/uL   RBC 4.78 3.77 - 5.28 x10E6/uL   Hemoglobin 12.2 11.1 - 15.9 g/dL   Hematocrit 39.3 34.0 - 46.6 %   MCV 82 79 - 97 fL   MCH 25.5 (L) 26.6 - 33.0 pg   MCHC 31.0 (L) 31.5 - 35.7 g/dL   RDW 14.8 12.3 - 15.4 %   Platelets 437 (H) 150 - 379 x10E3/uL   Neutrophils 42 Not Estab. %   Lymphs 49 Not Estab. %   Monocytes 7 Not Estab. %   Eos 2 Not Estab. %   Basos 0 Not Estab. %   Neutrophils Absolute 4.8 1.4 - 7.0 x10E3/uL   Lymphocytes Absolute 5.6 (H) 0.7 - 3.1 x10E3/uL   Monocytes Absolute 0.9 0.1 - 0.9 x10E3/uL   EOS (ABSOLUTE) 0.2 0.0 - 0.4 x10E3/uL   Basophils Absolute 0.0 0.0 - 0.2 x10E3/uL   Immature Granulocytes 0 Not Estab. %   Immature Grans (Abs) 0.0 0.0 - 0.1 x10E3/uL  Renal Function Panel     Status: Abnormal   Collection Time: 05/05/17  5:23 PM  Result Value Ref Range   Glucose 117 (H) 65 - 99 mg/dL   BUN 16 6 - 24 mg/dL   Creatinine, Ser 0.73 0.57 - 1.00 mg/dL   GFR calc non Af Amer 93 >59 mL/min/1.73   GFR calc Af Amer 107 >59 mL/min/1.73   BUN/Creatinine Ratio 22 9 - 23   Sodium 141 134 - 144 mmol/L   Potassium 4.3 3.5 - 5.2 mmol/L   Chloride 98 96 - 106 mmol/L   CO2 26 20 - 29 mmol/L   Calcium 9.9 8.7 - 10.2 mg/dL   Phosphorus 4.3 2.5 - 4.5 mg/dL   Albumin 4.8 3.5 - 5.5 g/dL    No results found.  ASSESSMENT AND PLAN:  Mikaiya was seen today for diabetes.  Diagnoses and all orders for this visit:  Type 2 diabetes mellitus without complication, with long-term current use of insulin ([place]): Well controlled with 10 lantus QHS, metformin 2 gram daily and GLP1. She is also losing weight.  Will see her back in about three months for recheck.  -     Flu Vaccine QUAD 36+ mos IM -     POCT glycosylated hemoglobin (Hb A1C) -     CBC with Differential/Platelet -     Renal Function Panel  Essential hypertension: She will start a BP diary.  If pressure consistently greater than 150/90 she will come back in.  Otherwise will review that log in three  months and make changes as necessary at that time.  -     metoprolol (TOPROL-XL) 200 MG 24 hr tablet; TAKE ONE TABLET BY MOUTH ONCE DAILY. TAKE WITH OR IMMEDIATELY FOLLOWING A MEAL -     lisinopril-hydrochlorothiazide (PRINZIDE,ZESTORETIC) 20-25 MG tablet; Take 1 tablet by mouth daily.  Gastroesophageal reflux disease without esophagitis -     pantoprazole (  PROTONIX) 40 MG tablet; Take 1 tablet (40 mg total) by mouth daily.  Need for prophylactic measure -     aspirin EC 81 MG tablet; Take 1 tablet (81 mg total) by mouth daily.  Need for prophylactic vaccination against Streptococcus pneumoniae (pneumococcus) -     Pneumococcal conjugate vaccine 13-valent IM  Other orders -     Cancel: Hemoglobin A1c    The patient is advised to call or return to clinic if she does not see an improvement in symptoms, or to seek the care of the closest emergency department if she worsens with the above plan.   Philis Fendt, MHS, PA-C [facility] 05/07/2017 9:28 AM

## 2017-05-06 LAB — RENAL FUNCTION PANEL
Albumin: 4.8 g/dL (ref 3.5–5.5)
BUN/Creatinine Ratio: 22 (ref 9–23)
BUN: 16 mg/dL (ref 6–24)
CO2: 26 mmol/L (ref 20–29)
Calcium: 9.9 mg/dL (ref 8.7–10.2)
Chloride: 98 mmol/L (ref 96–106)
Creatinine, Ser: 0.73 mg/dL (ref 0.57–1.00)
GFR calc Af Amer: 107 mL/min/{1.73_m2} (ref >59)
GFR calc non Af Amer: 93 mL/min/{1.73_m2} (ref >59)
Glucose: 117 mg/dL — ABNORMAL HIGH (ref 65–99)
Phosphorus: 4.3 mg/dL (ref 2.5–4.5)
Potassium: 4.3 mmol/L (ref 3.5–5.2)
Sodium: 141 mmol/L (ref 134–144)

## 2017-05-06 LAB — CBC WITH DIFFERENTIAL/PLATELET
Basophils Absolute: 0 10*3/uL (ref 0.0–0.2)
Basos: 0 %
EOS (ABSOLUTE): 0.2 10*3/uL (ref 0.0–0.4)
Eos: 2 %
Hematocrit: 39.3 % (ref 34.0–46.6)
Hemoglobin: 12.2 g/dL (ref 11.1–15.9)
Immature Grans (Abs): 0 10*3/uL (ref 0.0–0.1)
Immature Granulocytes: 0 %
Lymphocytes Absolute: 5.6 10*3/uL — ABNORMAL HIGH (ref 0.7–3.1)
Lymphs: 49 %
MCH: 25.5 pg — ABNORMAL LOW (ref 26.6–33.0)
MCHC: 31 g/dL — ABNORMAL LOW (ref 31.5–35.7)
MCV: 82 fL (ref 79–97)
Monocytes Absolute: 0.9 10*3/uL (ref 0.1–0.9)
Monocytes: 7 %
Neutrophils Absolute: 4.8 10*3/uL (ref 1.4–7.0)
Neutrophils: 42 %
Platelets: 437 10*3/uL — ABNORMAL HIGH (ref 150–379)
RBC: 4.78 x10E6/uL (ref 3.77–5.28)
RDW: 14.8 % (ref 12.3–15.4)
WBC: 11.6 10*3/uL — ABNORMAL HIGH (ref 3.4–10.8)

## 2017-05-07 NOTE — Progress Notes (Signed)
Can we add a peripheral smear.  If not I would like her to come back and get this. Philis Fendt, MS, PA-C 4:24 PM, 05/07/2017

## 2017-05-08 ENCOUNTER — Ambulatory Visit: Payer: BLUE CROSS/BLUE SHIELD | Admitting: Urgent Care

## 2017-05-08 DIAGNOSIS — D696 Thrombocytopenia, unspecified: Secondary | ICD-10-CM | POA: Diagnosis not present

## 2017-05-08 NOTE — Addendum Note (Signed)
Addended by: Gari Crown D on: 05/08/2017 04:18 PM   Modules accepted: Orders

## 2017-05-08 NOTE — Addendum Note (Signed)
Addended by: Gari Crown D on: 05/08/2017 04:20 PM   Modules accepted: Orders

## 2017-05-08 NOTE — Addendum Note (Signed)
Addended by: Gari Crown D on: 05/08/2017 05:41 PM   Modules accepted: Orders

## 2017-05-12 ENCOUNTER — Encounter: Payer: Self-pay | Admitting: Physician Assistant

## 2017-05-12 ENCOUNTER — Other Ambulatory Visit: Payer: Self-pay | Admitting: Urgent Care

## 2017-05-12 ENCOUNTER — Other Ambulatory Visit: Payer: Self-pay | Admitting: Physician Assistant

## 2017-05-12 DIAGNOSIS — I10 Essential (primary) hypertension: Secondary | ICD-10-CM

## 2017-05-12 DIAGNOSIS — K219 Gastro-esophageal reflux disease without esophagitis: Secondary | ICD-10-CM

## 2017-05-16 LAB — PATHOLOGIST SMEAR REVIEW
Basophils Absolute: 0 10*3/uL (ref 0.0–0.2)
Basos: 0 %
EOS (ABSOLUTE): 0.3 10*3/uL (ref 0.0–0.4)
Eos: 2 %
Hematocrit: 38.4 % (ref 34.0–46.6)
Hemoglobin: 12.4 g/dL (ref 11.1–15.9)
Immature Grans (Abs): 0 10*3/uL (ref 0.0–0.1)
Immature Granulocytes: 0 %
Lymphocytes Absolute: 6.6 10*3/uL — ABNORMAL HIGH (ref 0.7–3.1)
Lymphs: 53 %
MCH: 25.6 pg — ABNORMAL LOW (ref 26.6–33.0)
MCHC: 32.3 g/dL (ref 31.5–35.7)
MCV: 79 fL (ref 79–97)
Monocytes Absolute: 1.1 10*3/uL — ABNORMAL HIGH (ref 0.1–0.9)
Monocytes: 8 %
Neutrophils Absolute: 4.7 10*3/uL (ref 1.4–7.0)
Neutrophils: 37 %
Path Rev RBC: NORMAL
Platelets: 409 10*3/uL — ABNORMAL HIGH (ref 150–379)
RBC: 4.85 x10E6/uL (ref 3.77–5.28)
RDW: 15.2 % (ref 12.3–15.4)
WBC: 12.7 10*3/uL — ABNORMAL HIGH (ref 3.4–10.8)

## 2017-05-22 NOTE — Telephone Encounter (Signed)
error 

## 2017-05-28 ENCOUNTER — Other Ambulatory Visit: Payer: Self-pay | Admitting: Physician Assistant

## 2017-05-28 DIAGNOSIS — IMO0002 Reserved for concepts with insufficient information to code with codable children: Secondary | ICD-10-CM

## 2017-05-28 DIAGNOSIS — E1142 Type 2 diabetes mellitus with diabetic polyneuropathy: Secondary | ICD-10-CM

## 2017-05-28 DIAGNOSIS — E1165 Type 2 diabetes mellitus with hyperglycemia: Principal | ICD-10-CM

## 2017-05-28 NOTE — Telephone Encounter (Signed)
Review for refill. 

## 2017-06-06 ENCOUNTER — Encounter: Payer: Self-pay | Admitting: Physician Assistant

## 2017-06-06 ENCOUNTER — Other Ambulatory Visit: Payer: Self-pay

## 2017-06-06 ENCOUNTER — Ambulatory Visit: Payer: BLUE CROSS/BLUE SHIELD | Admitting: Physician Assistant

## 2017-06-06 VITALS — BP 160/94 | HR 73 | Temp 98.9°F | Resp 18 | Ht 65.9 in | Wt 250.4 lb

## 2017-06-06 DIAGNOSIS — R059 Cough, unspecified: Secondary | ICD-10-CM

## 2017-06-06 DIAGNOSIS — R05 Cough: Secondary | ICD-10-CM | POA: Diagnosis not present

## 2017-06-06 DIAGNOSIS — J209 Acute bronchitis, unspecified: Secondary | ICD-10-CM

## 2017-06-06 MED ORDER — AZITHROMYCIN 250 MG PO TABS: ORAL_TABLET | ORAL | 0 refills | Status: DC

## 2017-06-06 MED ORDER — BENZONATATE 100 MG PO CAPS: 100.0000 mg | ORAL_CAPSULE | Freq: Three times a day (TID) | ORAL | 0 refills | Status: DC | PRN

## 2017-06-06 MED ORDER — HYDROCODONE-HOMATROPINE 5-1.5 MG/5ML PO SYRP: 5.0000 mL | ORAL_SOLUTION | Freq: Three times a day (TID) | ORAL | 0 refills | Status: DC | PRN

## 2017-06-06 NOTE — Progress Notes (Signed)
[name]  MRN: [id number] DOB: [birth year]  PCP: Mancel Bale, PA-C  Subjective:  Pt is a 56 year old female PMH HTN, DM, obesity, HLD who presents to clinic for chest congestion x 2 weeks. Endorses shob, cough and increased chest pressure and headache. Cough is getting worse and is keeping her up at night. She has tried NyQuil, Tylenol Cold and Flu and Mucinex cold.  Denies fever, chills, night sweats, n/v, wheezing, sneezing.   Review of Systems  Constitutional: Negative for chills, diaphoresis, fatigue and fever.  HENT: Positive for congestion.   Respiratory: Positive for cough and shortness of breath. Negative for chest tightness and wheezing.   Psychiatric/Behavioral: Positive for sleep disturbance.    Patient Active Problem List   Diagnosis Date Noted  . Retinopathy 10/10/2015  . Pure hypercholesterolemia 12/21/2014  . HTN (hypertension) 06/13/2012  . Diabetes mellitus type II, uncontrolled ([place]) 11/14/2011  . Diabetic peripheral neuropathy associated with type 2 diabetes mellitus ([place]) 11/14/2011  . Obesity, Class III, BMI 40-49.9 (morbid obesity) ([place]) 11/14/2011  . Menopause 11/14/2011  . Mass on back 10/28/2011  . Pleural effusion 03/20/2011  . Cholecystitis chronic 01/24/2011  . Serous cystadenoma of pancreas 01/24/2011    Current Outpatient Medications on File Prior to Visit  Medication Sig Dispense Refill  . aspirin EC 81 MG tablet Take 1 tablet (81 mg total) by mouth daily. 365 tablet 0  . glucose blood (ONE TOUCH ULTRA TEST) test strip Test blood sugar daily. 100 each 2  . Insulin Pen Needle (NOVOFINE) 30G X 8 MM MISC Inject 10 each into the skin as needed. Dx E11.42 90 each 1  . LANTUS SOLOSTAR 100 UNIT/ML Solostar Pen INJECT 10 UNITS SUBCUTANEOUSLY ONCE DAILY AT  10  PM 5 pen 3  . lisinopril-hydrochlorothiazide (PRINZIDE,ZESTORETIC) 20-25 MG tablet Take 1 tablet by mouth daily. 90 tablet 1  . metFORMIN (GLUCOPHAGE) 1000 MG tablet Take 1 tablet (1,000 mg  total) by mouth 2 (two) times daily with a meal. 180 tablet 3  . metoprolol (TOPROL-XL) 200 MG 24 hr tablet TAKE ONE TABLET BY MOUTH ONCE DAILY. TAKE WITH OR IMMEDIATELY FOLLOWING A MEAL 90 tablet 1  . pantoprazole (PROTONIX) 40 MG tablet Take 1 tablet (40 mg total) by mouth daily. 90 tablet 3  . pantoprazole (PROTONIX) 40 MG tablet TAKE ONE TABLET BY MOUTH ONCE DAILY 90 tablet 3  . TRULICITY 4.09 WJ/1.9JY SOPN INJECT 0.75 MG SUBCUTANEOUSLY ONCE A WEEK 4 pen 0   No current facility-administered medications on file prior to visit.     Allergies  Allergen Reactions  . Norvasc [Amlodipine] Nausea Only  . Pravastatin     myalgia     Objective:  BP (!) 160/94 (BP Location: Right Arm, Patient Position: Sitting, Cuff Size: Large)   Pulse 73   Temp 98.9 F (37.2 C) (Oral)   Resp 18   Ht 5' 5.9" (1.674 m)   Wt 250 lb 6.4 oz (113.6 kg)   SpO2 98%   BMI 40.54 kg/m   Physical Exam  Constitutional: She is oriented to person, place, and time and well-developed, well-nourished, and in no distress. No distress.  HENT:  Right Ear: External ear normal.  Left Ear: External ear normal.  Mouth/Throat: Oropharynx is clear and moist.  Eyes: Conjunctivae are normal.  Cardiovascular: Normal rate, regular rhythm and normal heart sounds.  Pulmonary/Chest: Effort normal and breath sounds normal. She has no wheezes. She has no rales.  Neurological: She is alert and  oriented to person, place, and time. GCS score is 15.  Skin: Skin is warm and dry.  Psychiatric: Mood, memory, affect and judgment normal.  Vitals reviewed.   Assessment and Plan :  1. Acute bronchitis, unspecified organism - azithromycin (ZITHROMAX) 250 MG tablet; Take 2 tabs PO x 1 dose, then 1 tab PO QD x 4 days  Dispense: 6 tablet; Refill: 0  2. Cough - HYDROcodone-homatropine (HYCODAN) 5-1.5 MG/5ML syrup; Take 5 mLs by mouth every 8 (eight) hours as needed for cough.  Dispense: 120 mL; Refill: 0 - benzonatate (TESSALON) 100 MG  capsule; Take 1-2 capsules (100-200 mg total) by mouth 3 (three) times daily as needed for cough.  Dispense: 40 capsule; Refill: 0 - Supportive care discussed. RTC in 5-7 days if no improvement. She understands and agrees with plan.   Mercer Pod, PA-C  [facility] [place] 06/06/2017 10:38 AM

## 2017-06-06 NOTE — Patient Instructions (Addendum)
Stay well hydrated. Get lost of rest.   Advil or ibuprofen for pain. Do not take Aspirin.  Throat lozenges (if you are not at risk for choking) or sprays may be used to soothe your throat. Drink enough water and fluids to keep your urine clear or pale yellow. For sore throat: ? Gargle with 8 oz of salt water ( tsp of salt per 1 qt of water) as often as every 1-2 hours to soothe your throat.  Gargle liquid benadryl.  Use Elderberry syrup.  If you smoke, stop smoking while you are sick.   For sore throat try using a honey-based tea. Use 3 teaspoons of honey with juice squeezed from half lemon. Place shaved pieces of ginger into 1/2-1 cup of water and warm over stove top. Then mix the ingredients and repeat every 4 hours as needed.  Cough Syrup Recipe: Sweet Lemon & Honey Thyme  Ingredients a handful of fresh thyme sprigs   1 pint of water (2 cups)  1/2 cup honey (raw is best, but regular will do)  1/2 lemon chopped Instructions 1. Place the lemon in the pint jar and cover with the honey. The honey will macerate the lemons and draw out liquids which taste so delicious! 2. Meanwhile, toss the thyme leaves into a saucepan and cover them with the water. 3. Bring the water to a gentle simmer and reduce it to half, about a cup of tea. 4. When the tea is reduced and cooled a bit, strain the sprigs & leaves, add it into the pint jar and stir it well. 5. Give it a shake and use a spoonful as needed. 6. Store your homemade cough syrup in the refrigerator for about a month.  What causes a cough? In adults, common causes of a cough include: ?An infection of the airways or lungs (such as the common cold) ?Postnasal drip - Postnasal drip is when mucus from the nose drips down or flows along the back of the throat. Postnasal drip can happen when people have: .A cold .Allergies .A sinus infection - The sinuses are hollow areas in the bones of the face that open into the nose. ?Lung conditions,  like asthma and chronic obstructive pulmonary disease (COPD) - Both of these conditions can make it hard to breathe. COPD is usually caused by smoking. ?Acid reflux - Acid reflux is when the acid that is normally in your stomach backs up into your esophagus (the tube that carries food from your mouth to your stomach). ?A side effect from blood pressure medicines called "ACE inhibitors" ?Smoking cigarettes  Is there anything I can do on my own to get rid of my cough? Yes. To help get rid of your cough, you can: ?Use a humidifier in your bedroom ?Use an over-the-counter cough medicine, or suck on cough drops or hard candy ?Stop smoking, if you smoke ?If you have allergies, avoid the things you are allergic to (like pollen, dust, animals, or mold) If you have acid reflux, your doctor or nurse will tell you which lifestyle changes can help reduce symptoms.    IF you received an x-ray today, you will receive an invoice from [facility]. Please contact [facility] at [phone] with questions or concerns regarding your invoice.   IF you received labwork today, you will receive an invoice from [facility]. Please contact [facility] at [phone] with questions or concerns regarding your invoice.   Our billing staff will not be able to assist you with questions  regarding bills from these companies.  You will be contacted with the lab results as soon as they are available. The fastest way to get your results is to activate your My Chart account. Instructions are located on the last page of this paperwork. If you have not heard from [place] regarding the results in 2 weeks, please contact this office.

## 2017-06-20 ENCOUNTER — Other Ambulatory Visit: Payer: Self-pay | Admitting: Physician Assistant

## 2017-06-27 ENCOUNTER — Other Ambulatory Visit: Payer: Self-pay | Admitting: Physician Assistant

## 2017-06-27 DIAGNOSIS — E1165 Type 2 diabetes mellitus with hyperglycemia: Principal | ICD-10-CM

## 2017-06-27 DIAGNOSIS — E1142 Type 2 diabetes mellitus with diabetic polyneuropathy: Secondary | ICD-10-CM

## 2017-06-27 DIAGNOSIS — IMO0002 Reserved for concepts with insufficient information to code with codable children: Secondary | ICD-10-CM

## 2017-06-28 ENCOUNTER — Encounter: Payer: Self-pay | Admitting: Physician Assistant

## 2017-06-30 ENCOUNTER — Other Ambulatory Visit: Payer: Self-pay | Admitting: Physician Assistant

## 2017-06-30 DIAGNOSIS — IMO0002 Reserved for concepts with insufficient information to code with codable children: Secondary | ICD-10-CM

## 2017-06-30 DIAGNOSIS — E1165 Type 2 diabetes mellitus with hyperglycemia: Principal | ICD-10-CM

## 2017-06-30 DIAGNOSIS — E1142 Type 2 diabetes mellitus with diabetic polyneuropathy: Secondary | ICD-10-CM

## 2017-06-30 MED ORDER — DULAGLUTIDE 0.75 MG/0.5ML ~~LOC~~ SOAJ: SUBCUTANEOUS | 3 refills | Status: DC

## 2017-07-05 ENCOUNTER — Emergency Department (HOSPITAL_COMMUNITY): Payer: BLUE CROSS/BLUE SHIELD

## 2017-07-05 ENCOUNTER — Encounter (HOSPITAL_COMMUNITY): Payer: Self-pay | Admitting: Emergency Medicine

## 2017-07-05 ENCOUNTER — Emergency Department (HOSPITAL_COMMUNITY)
Admission: EM | Admit: 2017-07-05 | Discharge: 2017-07-05 | Disposition: A | Discharge status: Home / Self Care | Payer: BLUE CROSS/BLUE SHIELD | Attending: Emergency Medicine | Admitting: Emergency Medicine

## 2017-07-05 ENCOUNTER — Other Ambulatory Visit: Payer: Self-pay

## 2017-07-05 DIAGNOSIS — I10 Essential (primary) hypertension: Secondary | ICD-10-CM | POA: Diagnosis not present

## 2017-07-05 DIAGNOSIS — E119 Type 2 diabetes mellitus without complications: Secondary | ICD-10-CM | POA: Insufficient documentation

## 2017-07-05 DIAGNOSIS — R0789 Other chest pain: Secondary | ICD-10-CM | POA: Insufficient documentation

## 2017-07-05 DIAGNOSIS — Z9049 Acquired absence of other specified parts of digestive tract: Secondary | ICD-10-CM | POA: Diagnosis not present

## 2017-07-05 DIAGNOSIS — Z794 Long term (current) use of insulin: Secondary | ICD-10-CM | POA: Insufficient documentation

## 2017-07-05 DIAGNOSIS — Z87891 Personal history of nicotine dependence: Secondary | ICD-10-CM | POA: Diagnosis not present

## 2017-07-05 DIAGNOSIS — Z79899 Other long term (current) drug therapy: Secondary | ICD-10-CM | POA: Insufficient documentation

## 2017-07-05 DIAGNOSIS — M25512 Pain in left shoulder: Secondary | ICD-10-CM | POA: Diagnosis present

## 2017-07-05 DIAGNOSIS — R079 Chest pain, unspecified: Secondary | ICD-10-CM | POA: Diagnosis not present

## 2017-07-05 LAB — BASIC METABOLIC PANEL
Anion gap: 9 (ref 5–15)
BUN: 16 mg/dL (ref 6–20)
CO2: 27 mmol/L (ref 22–32)
Calcium: 9.4 mg/dL (ref 8.9–10.3)
Chloride: 101 mmol/L (ref 101–111)
Creatinine, Ser: 0.6 mg/dL (ref 0.44–1.00)
GFR calc Af Amer: >60 mL/min (ref >60)
GFR calc non Af Amer: >60 mL/min (ref >60)
Glucose, Bld: 133 mg/dL — ABNORMAL HIGH (ref 65–99)
Potassium: 3.9 mmol/L (ref 3.5–5.1)
Sodium: 137 mmol/L (ref 135–145)

## 2017-07-05 LAB — CBC
HCT: 35.5 % — ABNORMAL LOW (ref 36.0–46.0)
Hemoglobin: 11.4 g/dL — ABNORMAL LOW (ref 12.0–15.0)
MCH: 25.5 pg — ABNORMAL LOW (ref 26.0–34.0)
MCHC: 32.1 g/dL (ref 30.0–36.0)
MCV: 79.4 fL (ref 78.0–100.0)
Platelets: 352 10*3/uL (ref 150–400)
RBC: 4.47 MIL/uL (ref 3.87–5.11)
RDW: 15.1 % (ref 11.5–15.5)
WBC: 9.6 10*3/uL (ref 4.0–10.5)

## 2017-07-05 LAB — I-STAT TROPONIN, ED: Troponin i, poc: 0.01 ng/mL (ref 0.00–0.08)

## 2017-07-05 LAB — D-DIMER, QUANTITATIVE: D-Dimer, Quant: <0.27 ug/mL-FEU (ref 0.00–0.50)

## 2017-07-05 LAB — I-STAT BETA HCG BLOOD, ED (MC, WL, AP ONLY): I-stat hCG, quantitative: <5 m[IU]/mL (ref <5)

## 2017-07-05 MED ORDER — DICLOFENAC SODIUM 1 % TD GEL: 2.0000 g | Freq: Four times a day (QID) | TRANSDERMAL | 0 refills | Status: DC
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
## 2017-07-05 NOTE — ED Notes (Signed)
Patient transported to X-ray 

## 2017-07-05 NOTE — Discharge Instructions (Signed)
Take Tylenol as needed for pain.  You may take up to thousand milligrams 3 times a day. Use Voltaren gel as needed for pain. Continue to use ice packs or heating pads to help control your symptoms. Follow-up with your primary care doctor at your scheduled appointment on Monday. Return to the emergency room if you develop worsening pain, fevers, difficulty breathing, or any new or concerning symptoms.
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
## 2017-07-07 ENCOUNTER — Other Ambulatory Visit: Payer: Self-pay

## 2017-07-07 ENCOUNTER — Ambulatory Visit (INDEPENDENT_AMBULATORY_CARE_PROVIDER_SITE_OTHER): Payer: BLUE CROSS/BLUE SHIELD | Admitting: Physician Assistant

## 2017-07-07 ENCOUNTER — Encounter: Payer: Self-pay | Admitting: Physician Assistant

## 2017-07-07 VITALS — BP 168/103 | HR 63 | Temp 98.2°F | Resp 16 | Ht 66.0 in | Wt 258.2 lb

## 2017-07-07 DIAGNOSIS — M25512 Pain in left shoulder: Secondary | ICD-10-CM

## 2017-07-07 DIAGNOSIS — M5412 Radiculopathy, cervical region: Secondary | ICD-10-CM

## 2017-07-07 MED ORDER — PREDNISONE 20 MG PO TABS: ORAL_TABLET | ORAL | 0 refills | Status: DC

## 2017-07-07 MED ORDER — CYCLOBENZAPRINE HCL 10 MG PO TABS: 10.0000 mg | ORAL_TABLET | Freq: Three times a day (TID) | ORAL | 0 refills | Status: DC | PRN

## 2017-07-07 NOTE — Patient Instructions (Addendum)
Start prednisone taper. You may take this with Flexeril (muscle relaxer) and NSAIDs. Flexeril may may you drowsy - if this is the case only take this at night before bed.  Prednisone will spike your blood sugar. Please check your blood sugar 2-3 times a day while taking this.  Continue using heating pad and stay well hydrated. Stretch your neck muscles. Consider getting massages.  Come back in 7-10 days if you are still not improving.    For cough: Stay well hydrated. Get lost of rest.   Use Elderberry syrup.   For sore throat try using a honey-based tea. Use 3 teaspoons of honey with juice squeezed from half lemon. Place shaved pieces of ginger into 1/2-1 cup of water and warm over stove top. Then mix the ingredients and repeat every 4 hours as needed.  Cough Syrup Recipe: Sweet Lemon & Honey Thyme  Ingredients a handful of fresh thyme sprigs   1 pint of water (2 cups)  1/2 cup honey (raw is best, but regular will do)  1/2 lemon chopped Instructions 1. Place the lemon in the pint jar and cover with the honey. The honey will macerate the lemons and draw out liquids which taste so delicious! 2. Meanwhile, toss the thyme leaves into a saucepan and cover them with the water. 3. Bring the water to a gentle simmer and reduce it to half, about a cup of tea. 4. When the tea is reduced and cooled a bit, strain the sprigs & leaves, add it into the pint jar and stir it well. 5. Give it a shake and use a spoonful as needed. 6. Store your homemade cough syrup in the refrigerator for about a month.  What causes a cough? In adults, common causes of a cough include: ?An infection of the airways or lungs (such as the common cold) ?Postnasal drip - Postnasal drip is when mucus from the nose drips down or flows along the back of the throat. Postnasal drip can happen when people have: .A cold .Allergies .A sinus infection - The sinuses are hollow areas in the bones of the face that open into the  nose. ?Lung conditions, like asthma and chronic obstructive pulmonary disease (COPD) - Both of these conditions can make it hard to breathe. COPD is usually caused by smoking. ?Acid reflux - Acid reflux is when the acid that is normally in your stomach backs up into your esophagus (the tube that carries food from your mouth to your stomach). ?A side effect from blood pressure medicines called "ACE inhibitors" ?Smoking cigarettes  Is there anything I can do on my own to get rid of my cough? Yes. To help get rid of your cough, you can: ?Use a humidifier in your bedroom ?Use an over-the-counter cough medicine, or suck on cough drops or hard candy ?Stop smoking, if you smoke ?If you have allergies, avoid the things you are allergic to (like pollen, dust, animals, or mold) If you have acid reflux, your doctor or nurse will tell you which lifestyle changes can help reduce symptoms.  Thank you for coming in today. I hope you feel we met your needs.  Feel free to call PCP if you have any questions or further requests.  Please consider signing up for MyChart if you do not already have it, as this is a great way to communicate with me.  Best,  Whitney McVey, PA-C  IF you received an x-ray today, you will receive an invoice from [facility]. Please  contact [facility]iology at [phone] with questions or concerns regarding your invoice.   IF you received labwork today, you will receive an invoice from [facility]. Please contact [facility] at [phone] with questions or concerns regarding your invoice.   Our billing staff will not be able to assist you with questions regarding bills from these companies.  You will be contacted with the lab results as soon as they are available. The fastest way to get your results is to activate your My Chart account. Instructions are located on the last page of this paperwork. If you have not heard from [place] regarding the results in 2 weeks, please  contact this office.
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
## 2017-07-10 ENCOUNTER — Encounter: Payer: Self-pay | Admitting: Physician Assistant

## 2017-07-16 MED ORDER — EXENATIDE ER 2 MG/0.85ML ~~LOC~~ AUIJ: 2.0000 mg | AUTO-INJECTOR | SUBCUTANEOUS | 3 refills | Status: DC

## 2017-10-06 DIAGNOSIS — D485 Neoplasm of uncertain behavior of skin: Secondary | ICD-10-CM | POA: Diagnosis not present

## 2017-10-06 DIAGNOSIS — L989 Disorder of the skin and subcutaneous tissue, unspecified: Secondary | ICD-10-CM | POA: Diagnosis not present

## 2017-11-18 DIAGNOSIS — D210 Benign neoplasm of connective and other soft tissue of head, face and neck: Secondary | ICD-10-CM | POA: Diagnosis not present

## 2017-11-21 ENCOUNTER — Other Ambulatory Visit: Payer: Self-pay | Admitting: Physician Assistant

## 2017-11-21 ENCOUNTER — Telehealth: Payer: Self-pay | Admitting: Physician Assistant

## 2017-11-21 DIAGNOSIS — I10 Essential (primary) hypertension: Secondary | ICD-10-CM

## 2017-11-25 ENCOUNTER — Other Ambulatory Visit: Payer: Self-pay

## 2017-11-25 DIAGNOSIS — I10 Essential (primary) hypertension: Secondary | ICD-10-CM

## 2017-11-25 MED ORDER — LISINOPRIL-HYDROCHLOROTHIAZIDE 20-25 MG PO TABS: 1.0000 | ORAL_TABLET | Freq: Every day | ORAL | 0 refills | Status: DC

## 2017-11-25 NOTE — Telephone Encounter (Signed)
PT called because her      lisinopril-hydrochlorothiazide (PRINZIDE,ZESTORETIC) 20-25 MG tablet    Was denied because she needed an apt.  Pt has apt schedule on Monday 5/20,  She is asking if she can not get refill, can get samples from office until her apt Monday     [phone]

## 2017-11-25 NOTE — Telephone Encounter (Signed)
30 day fill given and notified via mychart

## 2017-11-26 ENCOUNTER — Other Ambulatory Visit: Payer: Self-pay

## 2017-11-26 DIAGNOSIS — IMO0002 Reserved for concepts with insufficient information to code with codable children: Secondary | ICD-10-CM

## 2017-11-26 DIAGNOSIS — E1165 Type 2 diabetes mellitus with hyperglycemia: Principal | ICD-10-CM

## 2017-11-26 DIAGNOSIS — E1142 Type 2 diabetes mellitus with diabetic polyneuropathy: Secondary | ICD-10-CM

## 2017-11-26 MED ORDER — METFORMIN HCL 1000 MG PO TABS: 1000.0000 mg | ORAL_TABLET | Freq: Two times a day (BID) | ORAL | 0 refills | Status: DC

## 2017-12-01 ENCOUNTER — Other Ambulatory Visit: Payer: Self-pay

## 2017-12-01 ENCOUNTER — Encounter: Payer: Self-pay | Admitting: Physician Assistant

## 2017-12-01 ENCOUNTER — Ambulatory Visit (INDEPENDENT_AMBULATORY_CARE_PROVIDER_SITE_OTHER): Payer: BLUE CROSS/BLUE SHIELD | Admitting: Physician Assistant

## 2017-12-01 ENCOUNTER — Ambulatory Visit (INDEPENDENT_AMBULATORY_CARE_PROVIDER_SITE_OTHER): Payer: BLUE CROSS/BLUE SHIELD

## 2017-12-01 VITALS — BP 124/72 | HR 73 | Temp 98.8°F | Resp 18 | Ht 66.14 in | Wt 255.4 lb

## 2017-12-01 DIAGNOSIS — Z130 Encounter for screening for diseases of the blood and blood-forming organs and certain disorders involving the immune mechanism: Secondary | ICD-10-CM | POA: Diagnosis not present

## 2017-12-01 DIAGNOSIS — E1165 Type 2 diabetes mellitus with hyperglycemia: Secondary | ICD-10-CM | POA: Diagnosis not present

## 2017-12-01 DIAGNOSIS — E7800 Pure hypercholesterolemia, unspecified: Secondary | ICD-10-CM

## 2017-12-01 DIAGNOSIS — K219 Gastro-esophageal reflux disease without esophagitis: Secondary | ICD-10-CM | POA: Diagnosis not present

## 2017-12-01 DIAGNOSIS — M546 Pain in thoracic spine: Secondary | ICD-10-CM | POA: Diagnosis not present

## 2017-12-01 DIAGNOSIS — Z9081 Acquired absence of spleen: Secondary | ICD-10-CM | POA: Insufficient documentation

## 2017-12-01 DIAGNOSIS — G8929 Other chronic pain: Secondary | ICD-10-CM

## 2017-12-01 DIAGNOSIS — N951 Menopausal and female climacteric states: Secondary | ICD-10-CM

## 2017-12-01 DIAGNOSIS — I10 Essential (primary) hypertension: Secondary | ICD-10-CM | POA: Diagnosis not present

## 2017-12-01 DIAGNOSIS — B977 Papillomavirus as the cause of diseases classified elsewhere: Secondary | ICD-10-CM

## 2017-12-01 DIAGNOSIS — Z01419 Encounter for gynecological examination (general) (routine) without abnormal findings: Secondary | ICD-10-CM

## 2017-12-01 DIAGNOSIS — Z23 Encounter for immunization: Secondary | ICD-10-CM

## 2017-12-01 DIAGNOSIS — Z114 Encounter for screening for human immunodeficiency virus [HIV]: Secondary | ICD-10-CM

## 2017-12-01 DIAGNOSIS — Z0000 Encounter for general adult medical examination without abnormal findings: Secondary | ICD-10-CM

## 2017-12-01 DIAGNOSIS — IMO0002 Reserved for concepts with insufficient information to code with codable children: Secondary | ICD-10-CM

## 2017-12-01 DIAGNOSIS — E1142 Type 2 diabetes mellitus with diabetic polyneuropathy: Secondary | ICD-10-CM

## 2017-12-01 DIAGNOSIS — D279 Benign neoplasm of unspecified ovary: Secondary | ICD-10-CM

## 2017-12-01 DIAGNOSIS — Z113 Encounter for screening for infections with a predominantly sexual mode of transmission: Secondary | ICD-10-CM

## 2017-12-01 MED ORDER — GLUCOSE BLOOD VI STRP: ORAL_STRIP | 2 refills | Status: DC

## 2017-12-01 MED ORDER — LISINOPRIL-HYDROCHLOROTHIAZIDE 20-25 MG PO TABS: 1.0000 | ORAL_TABLET | Freq: Every day | ORAL | 0 refills | Status: DC

## 2017-12-01 MED ORDER — PANTOPRAZOLE SODIUM 40 MG PO TBEC: 40.0000 mg | DELAYED_RELEASE_TABLET | Freq: Every day | ORAL | 3 refills | Status: DC

## 2017-12-01 MED ORDER — METOPROLOL SUCCINATE ER 200 MG PO TB24: ORAL_TABLET | ORAL | 0 refills | Status: DC

## 2017-12-01 MED ORDER — METFORMIN HCL ER 500 MG PO TB24: 2000.0000 mg | ORAL_TABLET | Freq: Every day | ORAL | 0 refills | Status: DC

## 2017-12-01 MED ORDER — INSULIN GLARGINE 100 UNIT/ML SOLOSTAR PEN: PEN_INJECTOR | SUBCUTANEOUS | 3 refills | Status: DC

## 2017-12-01 MED ORDER — GABAPENTIN 100 MG PO CAPS: 100.0000 mg | ORAL_CAPSULE | Freq: Three times a day (TID) | ORAL | 3 refills | Status: DC

## 2017-12-01 MED ORDER — INSULIN PEN NEEDLE 31G X 6 MM MISC: 1 refills | Status: DC

## 2017-12-01 NOTE — Patient Instructions (Signed)
     IF you received an x-ray today, you will receive an invoice from [facility]. Please contact [facility] at [phone] with questions or concerns regarding your invoice.   IF you received labwork today, you will receive an invoice from [facility]. Please contact [facility] at [phone] with questions or concerns regarding your invoice.   Our billing staff will not be able to assist you with questions regarding bills from these companies.  You will be contacted with the lab results as soon as they are available. The fastest way to get your results is to activate your My Chart account. Instructions are located on the last page of this paperwork. If you have not heard from us regarding the results in 2 weeks, please contact this office.     

## 2017-12-01 NOTE — Progress Notes (Signed)
[name]  MRN: [id number] DOB: September 15, 1960  PCP: Mancel Bale, PA-C   Chief Complaint  Patient presents with  . Annual Exam    with pap     Subjective:  Pt presents to clinic for a CPE.  Patient is having hot flashes.  They seem to be worse at night.  She is wearing cotton clothing, has cotton bedsheets, and using a ceiling fan.  She keeps the air conditioning low at night her husband complains.  She has tried black cohosh but it in increased her blood pressure.  She has had a hysterectomy still has her ovaries.    She is also having left-sided mid thoracic back pain.  Hurts more when she moves.  She does not believe she had similar pain when they diagnosed her pancreatic cyst and removed it.  But she has not had her pancreas checked in a while.  She is having no urinary symptoms.  She has had this pain for about a year and it has not changed.  She is no longer working on the gym due to that causing increase in the pain, but she is trying to walk.  Walking does not seem to aggravate the pain.  She is having no shortness of breath or cough.  Last dental exam: not in a long time Last vision exam: 03/2017 Last pap: ? Unsure if she has a cervix Last mammo: scheduled for next month Last colonoscopy: 2015  Vaccinations - UTD   Typical meals for patient: 3 meals, no snacks Typical beverage choices: water (not much though) Exercises: minimal 6000 steps a day Sleeps: 6 hrs per night and restless (hotflashes)   Patient Active Problem List   Diagnosis Date Noted  . Status post splenectomy - needs vaccines 12/01/2017  . Retinopathy - followed by ophthalmologist 10/10/2015  . Pure hypercholesterolemia - on medications 12/21/2014  . HTN (hypertension) - well controlled on medications 06/13/2012  . Diabetes mellitus type II, uncontrolled ([place])  Takes medications as directed - she may have to change her bydureon due to insurance coverage - metformin is given her diarrhea and stomach  pain 11/14/2011  . Diabetic peripheral neuropathy associated with type 2 diabetes mellitus ([place]) - currently not bothering her that much 11/14/2011  . Obesity, Class III, BMI 40-49.9 (morbid obesity) ([place]) 11/14/2011  . Menopause - having hot flashes 11/14/2011  . Mass on back - lipoma - she has had removed in the past but it returned  She is not interested in having it removed again at this time 10/28/2011  . Pleural effusion 03/20/2011  . Status post cholecystectomy 01/24/2011  . Serous cystadenoma of pancreas 01/24/2011    Patient Care Team: Mittie Bodo as PCP - Cleda Daub, MD as Consulting Physician (Gastroenterology)  Review of Systems  HENT: Negative.   Eyes: Negative.   Respiratory: Negative.   Cardiovascular: Negative.   Gastrointestinal: Negative.   Endocrine: Negative.   Genitourinary: Negative.        Hot flashes - worse at night  Musculoskeletal: Positive for back pain (left sided - worse with exercise).  Skin: Negative.   Allergic/Immunologic: Negative.   Neurological: Negative.   Hematological: Negative.   Psychiatric/Behavioral: Negative.      Current Outpatient Medications on File Prior to Visit  Medication Sig Dispense Refill  . aspirin EC 81 MG tablet Take 1 tablet (81 mg total) by mouth daily. 365 tablet 0  . Exenatide ER (BYDUREON BCISE) 2 MG/0.85ML AUIJ Inject  2 mg into the skin once a week. 4 pen 3  . naproxen sodium (ALEVE) 220 MG tablet Take 440 mg by mouth daily as needed (pain).     No current facility-administered medications on file prior to visit.     Allergies  Allergen Reactions  . Norvasc [Amlodipine] Nausea Only  . Pravastatin     myalgia    Social History   Socioeconomic History  . Marital status: Married    Spouse name: Not on file  . Number of children: Not on file  . Years of education: Not on file  . Highest education level: Not on file  Occupational History  . Not on file  Social Needs  . Financial  resource strain: Not on file  . Food insecurity:    Worry: Not on file    Inability: Not on file  . Transportation needs:    Medical: Not on file    Non-medical: Not on file  Tobacco Use  . Smoking status: Former Smoker    Last attempt to quit: 12/19/1981    Years since quitting: 35.9  . Smokeless tobacco: Never Used  Substance and Sexual Activity  . Alcohol use: Yes    Alcohol/week: 0.5 oz    Types: 1 Standard drinks or equivalent per week  . Drug use: No  . Sexual activity: Yes    Partners: Male  Lifestyle  . Physical activity:    Days per week: Not on file    Minutes per session: Not on file  . Stress: Not on file  Relationships  . Social connections:    Talks on phone: Not on file    Gets together: Not on file    Attends religious service: Not on file    Active member of club or organization: Not on file    Attends meetings of clubs or organizations: Not on file    Relationship status: Not on file  Other Topics Concern  . Not on file  Social History Narrative  . Not on file    Past Surgical History:  Procedure Laterality Date  . ABDOMINAL HYSTERECTOMY     partial  . BREAST BIOPSY     left  . BREAST EXCISIONAL BIOPSY Left   . CESAREAN SECTION    . CHOLECYSTECTOMY    . CYSTECTOMY    . PANCREATECTOMY  01/09/2011  . SPLENECTOMY, PARTIAL  01/09/2011    Family History  Problem Relation Age of Onset  . Heart disease Father        died at age 60  . Stroke Mother 35       per pt stroke from high doses of iron injections  . Cancer Mother 71       cervical  . Diabetes Mother      Objective:  BP 124/72   Pulse 73   Temp 98.8 F (37.1 C) (Oral)   Resp 18   Ht 5' 6.14" (1.68 m)   Wt 255 lb 6.4 oz (115.8 kg)   SpO2 97%   BMI 41.05 kg/m   Physical Exam  Constitutional: She is oriented to person, place, and time. She appears well-developed and well-nourished.  HENT:  Head: Normocephalic and atraumatic.  Right Ear: Hearing, tympanic membrane, external ear  and ear canal normal.  Left Ear: Hearing, tympanic membrane, external ear and ear canal normal.  Nose: Nose normal.  Mouth/Throat: Uvula is midline, oropharynx is clear and moist and mucous membranes are normal.  Eyes: Pupils are equal, round,  and reactive to light. Conjunctivae, EOM and lids are normal. Right eye exhibits no discharge. Left eye exhibits no discharge.  Neck: Trachea normal and normal range of motion. Neck supple. No thyroid mass and no thyromegaly present.  Cardiovascular: Normal rate, regular rhythm and normal heart sounds.  No murmur heard. Pulmonary/Chest: Effort normal and breath sounds normal. She has no wheezes.  Abdominal: Soft. Normal appearance and bowel sounds are normal. There is no tenderness.  Genitourinary: Vagina normal. Pelvic exam was performed with patient supine. There is no rash, tenderness, lesion or injury on the right labia. There is no rash, tenderness, lesion or injury on the left labia. Right adnexum displays no mass, no tenderness and no fullness. Left adnexum displays fullness. Left adnexum displays no mass and no tenderness.  Genitourinary Comments: No cervix - cuff healthy looking  Musculoskeletal: Normal range of motion.       Thoracic back: Normal. She exhibits normal range of motion and no tenderness.       Back:  Lymphadenopathy:       Head (right side): No tonsillar, no preauricular, no posterior auricular and no occipital adenopathy present.       Head (left side): No tonsillar, no preauricular, no posterior auricular and no occipital adenopathy present.    She has no cervical adenopathy.       Right: No supraclavicular adenopathy present.       Left: No supraclavicular adenopathy present.  Neurological: She is alert and oriented to person, place, and time. She has normal strength and normal reflexes.  Skin: Skin is warm, dry and intact.  Lipoma left upper back  Psychiatric: She has a normal mood and affect. Her speech is normal and  behavior is normal. Judgment and thought content normal.  Vitals reviewed.    Dg Thoracic Spine 2 View  Result Date: 12/01/2017 CLINICAL DATA:  Left-sided back pain for 1 year EXAM: THORACIC SPINE 2 VIEWS COMPARISON:  None. FINDINGS: Unchanged appearance of thoracic dextroscoliosis. No focal airspace disease. Mild multilevel vertebral body height loss without evidence of acute fracture. IMPRESSION: Thoracic dextroscoliosis without acute abnormality. Electronically Signed   By: Ulyses Jarred M.D.   On: 12/01/2017 17:42     Wt Readings from Last 3 Encounters:  12/01/17 255 lb 6.4 oz (115.8 kg)  07/07/17 258 lb 3.2 oz (117.1 kg)  07/05/17 256 lb (116.1 kg)     Visual Acuity Screening   Right eye Left eye Both eyes  Without correction: 20/30 20/30-1 20/20-2  With correction:       Assessment and Plan :  Annual physical exam - anticipatory guidance given  Status post splenectomy - Plan: Pneumococcal polysaccharide vaccine 23-valent greater than or equal to 2yo subcutaneous/IM - vaccine update  Diabetic peripheral neuropathy associated with type 2 diabetes mellitus ([place]) - not bother her currently - keep good conrol of DM to prevent progression  Essential hypertension - Plan: TSH, Urinalysis, dipstick only, metoprolol (TOPROL-XL) 200 MG 24 hr tablet, lisinopril-hydrochlorothiazide (PRINZIDE,ZESTORETIC) 20-25 MG tablet - controlled on current medications  Uncontrolled type 2 diabetes mellitus with hyperglycemia (H[facility]) - Plan: CMP14+EGFR, metFORMIN (GLUCOPHAGE XR) 500 MG 24 hr tablet, Hemoglobin A1c - change from metformin due to diarrhea to metformin XR to see if that helps  Serous cystadenoma of pancreas - Plan: Lipase - check labs to make sure this is not the cause of her back pain  Pure hypercholesterolemia - Plan: Lipid panel, CMP14+EGFR - check labs  Obesity, Class III, BMI 40-49.9 (morbid obesity) (  [place]) - encouraged healty eating and exercise  Screening for deficiency anemia -  Plan: CBC with Differential/Platelet  Encounter for gynecological examination without abnormal finding - Plan: Pap IG, CT/NG NAA, and HPV (high risk),   Screen for STD (sexually transmitted disease) - Plan: RPR  Screening for HIV (human immunodeficiency virus) - Plan: HIV antibody  Vasomotor symptoms due to menopause - Plan: gabapentin (NEURONTIN) 100 MG capsule - pt has mild diabteic neuropathy - we will start with gabapentin for her hot flashes - she will titrate up from 185m to 3012mat night for symptom control  Need for 23-polyvalent pneumococcal polysaccharide vaccine - Plan: Pneumococcal polysaccharide vaccine 23-valent greater than or equal to 2yo subcutaneous/IM  Chronic left-sided thoracic back pain - Plan: DG Thoracic Spine 2 View - likely MSK - check xrays  Diabetes mellitus type II, uncontrolled ([place]- Plan: glucose blood (ONE TOUCH ULTRA TEST) test strip  Gastroesophageal reflux disease without esophagitis - Plan: pantoprazole (PROTONIX) 40 MG tablet  Uncontrolled type 2 diabetes mellitus with diabetic polyneuropathy, without long-term current use of insulin ([place]- Plan: Insulin Glargine (LANTUS SOLOSTAR) 100 UNIT/ML Solostar Pen - continue DM medications - when her insurance will no longer cover we will figure out which one they will  SaWindell HummingbirdA-C  [facility]/21/2019 1:54 PM

## 2017-12-02 ENCOUNTER — Encounter: Payer: Self-pay | Admitting: Physician Assistant

## 2017-12-02 LAB — CMP14+EGFR
ALT: 22 IU/L (ref 0–32)
AST: 14 IU/L (ref 0–40)
Albumin/Globulin Ratio: 1.7 (ref 1.2–2.2)
Albumin: 4.7 g/dL (ref 3.5–5.5)
Alkaline Phosphatase: 72 IU/L (ref 39–117)
BUN/Creatinine Ratio: 21 (ref 9–23)
BUN: 16 mg/dL (ref 6–24)
Bilirubin Total: 0.3 mg/dL (ref 0.0–1.2)
CO2: 25 mmol/L (ref 20–29)
Calcium: 10.3 mg/dL — ABNORMAL HIGH (ref 8.7–10.2)
Chloride: 99 mmol/L (ref 96–106)
Creatinine, Ser: 0.75 mg/dL (ref 0.57–1.00)
GFR calc Af Amer: 103 mL/min/{1.73_m2} (ref >59)
GFR calc non Af Amer: 89 mL/min/{1.73_m2} (ref >59)
Globulin, Total: 2.7 g/dL (ref 1.5–4.5)
Glucose: 155 mg/dL — ABNORMAL HIGH (ref 65–99)
Potassium: 4.1 mmol/L (ref 3.5–5.2)
Sodium: 141 mmol/L (ref 134–144)
Total Protein: 7.4 g/dL (ref 6.0–8.5)

## 2017-12-02 LAB — CBC WITH DIFFERENTIAL/PLATELET
Basophils Absolute: 0 10*3/uL (ref 0.0–0.2)
Basos: 0 %
EOS (ABSOLUTE): 0.2 10*3/uL (ref 0.0–0.4)
Eos: 2 %
Hematocrit: 38.4 % (ref 34.0–46.6)
Hemoglobin: 12.1 g/dL (ref 11.1–15.9)
Immature Grans (Abs): 0 10*3/uL (ref 0.0–0.1)
Immature Granulocytes: 0 %
Lymphocytes Absolute: 5.7 10*3/uL — ABNORMAL HIGH (ref 0.7–3.1)
Lymphs: 48 %
MCH: 25.5 pg — ABNORMAL LOW (ref 26.6–33.0)
MCHC: 31.5 g/dL (ref 31.5–35.7)
MCV: 81 fL (ref 79–97)
Monocytes Absolute: 0.6 10*3/uL (ref 0.1–0.9)
Monocytes: 5 %
Neutrophils Absolute: 5.4 10*3/uL (ref 1.4–7.0)
Neutrophils: 45 %
Platelets: 403 10*3/uL (ref 150–450)
RBC: 4.74 x10E6/uL (ref 3.77–5.28)
RDW: 15.7 % — ABNORMAL HIGH (ref 12.3–15.4)
WBC: 12 10*3/uL — ABNORMAL HIGH (ref 3.4–10.8)

## 2017-12-02 LAB — LIPID PANEL
Chol/HDL Ratio: 3 ratio (ref 0.0–4.4)
Cholesterol, Total: 202 mg/dL — ABNORMAL HIGH (ref 100–199)
HDL: 67 mg/dL (ref >39)
LDL Calculated: 109 mg/dL — ABNORMAL HIGH (ref 0–99)
Triglycerides: 129 mg/dL (ref 0–149)
VLDL Cholesterol Cal: 26 mg/dL (ref 5–40)

## 2017-12-02 LAB — URINALYSIS, DIPSTICK ONLY
Bilirubin, UA: NEGATIVE
Glucose, UA: NEGATIVE
Ketones, UA: NEGATIVE
Leukocytes, UA: NEGATIVE
Nitrite, UA: NEGATIVE
Protein, UA: NEGATIVE
RBC, UA: NEGATIVE
Specific Gravity, UA: 1.026 (ref 1.005–1.030)
Urobilinogen, Ur: 0.2 mg/dL (ref 0.2–1.0)
pH, UA: 5 (ref 5.0–7.5)

## 2017-12-02 LAB — LIPASE: Lipase: 26 U/L (ref 14–72)

## 2017-12-02 LAB — HIV ANTIBODY (ROUTINE TESTING W REFLEX): HIV Screen 4th Generation wRfx: NONREACTIVE

## 2017-12-02 LAB — RPR: RPR Ser Ql: NONREACTIVE

## 2017-12-02 LAB — HEMOGLOBIN A1C
Est. average glucose Bld gHb Est-mCnc: 169 mg/dL
Hgb A1c MFr Bld: 7.5 % — ABNORMAL HIGH (ref 4.8–5.6)

## 2017-12-02 LAB — TSH: TSH: 4.23 u[IU]/mL (ref 0.450–4.500)

## 2017-12-04 ENCOUNTER — Encounter: Payer: Self-pay | Admitting: Physician Assistant

## 2017-12-09 ENCOUNTER — Other Ambulatory Visit: Payer: Self-pay | Admitting: Physician Assistant

## 2017-12-11 ENCOUNTER — Encounter: Payer: Self-pay | Admitting: Physician Assistant

## 2017-12-11 DIAGNOSIS — D171 Benign lipomatous neoplasm of skin and subcutaneous tissue of trunk: Secondary | ICD-10-CM

## 2017-12-11 DIAGNOSIS — M546 Pain in thoracic spine: Secondary | ICD-10-CM

## 2017-12-11 DIAGNOSIS — G8929 Other chronic pain: Secondary | ICD-10-CM

## 2017-12-13 ENCOUNTER — Other Ambulatory Visit: Payer: Self-pay | Admitting: Physician Assistant

## 2017-12-15 ENCOUNTER — Telehealth: Payer: Self-pay | Admitting: Physician Assistant

## 2017-12-15 NOTE — Addendum Note (Signed)
Addended by: Mancel Bale on: 12/15/2017 11:24 AM   Modules accepted: Orders

## 2017-12-15 NOTE — Telephone Encounter (Signed)
Patient's insurance refused payment on Exenatide ER, suggested Trulicity as an alternative. Patient was on Trulicity prior to 6244 with a different insurance company. Requsting a prescription called in for Trulicity as she can no longer refill Exenatide ER due to insurance. LOV 12/01/17  PCP Windell Hummingbird, [place] [phone] On [address] [place] for consideration.

## 2017-12-15 NOTE — Telephone Encounter (Signed)
Copied from [place] [id number]. Topic: Quick Communication - Rx Refill/Question >> Dec 15, 2017 12:04 PM Oliver Pila B wrote: Pt called and states the pharmacist said the medication below was denied b/c pt is taking lantus but pt states she has been taking both and wants to know why medication cant be filled  Medication: Exenatide ER (BYDUREON BCISE) 2 MG/0.85ML AUIJ [[id number]]    Has the patient contacted their pharmacy? Yes.   (Agent: If no, request that the patient contact the pharmacy for the refill.) (Agent: If yes, when and what did the pharmacy advise?)  Preferred Pharmacy (with phone number or street name): [facility] on [address]  Agent: Please be advised that RX refills may take up to 3 business days. We ask that you follow-up with your pharmacy.

## 2017-12-16 LAB — PAP IG, CT-NG NAA, HPV HIGH-RISK
Chlamydia, Nuc. Acid Amp: NEGATIVE
Gonococcus by Nucleic Acid Amp: NEGATIVE
HPV, high-risk: POSITIVE — AB
PAP Smear Comment: 0

## 2017-12-16 MED ORDER — DULAGLUTIDE 0.75 MG/0.5ML ~~LOC~~ SOAJ: 0.7500 mg | SUBCUTANEOUS | 0 refills | Status: DC

## 2017-12-16 NOTE — Telephone Encounter (Signed)
Please advise 

## 2017-12-16 NOTE — Telephone Encounter (Signed)
chaged to trulicty

## 2017-12-17 NOTE — Addendum Note (Signed)
Addended by: Mancel Bale on: 12/17/2017 03:36 PM   Modules accepted: Orders

## 2017-12-17 NOTE — Telephone Encounter (Signed)
Patient advised.

## 2017-12-25 DIAGNOSIS — M546 Pain in thoracic spine: Secondary | ICD-10-CM | POA: Diagnosis not present

## 2017-12-25 DIAGNOSIS — M545 Low back pain: Secondary | ICD-10-CM | POA: Diagnosis not present

## 2017-12-25 DIAGNOSIS — M2560 Stiffness of unspecified joint, not elsewhere classified: Secondary | ICD-10-CM | POA: Diagnosis not present

## 2017-12-29 ENCOUNTER — Encounter: Payer: Self-pay | Admitting: Physician Assistant

## 2017-12-30 LAB — SPECIMEN STATUS REPORT

## 2017-12-30 LAB — HPV GENOTYPES 16/18,45

## 2018-01-01 DIAGNOSIS — M545 Low back pain: Secondary | ICD-10-CM | POA: Diagnosis not present

## 2018-01-01 DIAGNOSIS — M2560 Stiffness of unspecified joint, not elsewhere classified: Secondary | ICD-10-CM | POA: Diagnosis not present

## 2018-01-01 DIAGNOSIS — M546 Pain in thoracic spine: Secondary | ICD-10-CM | POA: Diagnosis not present

## 2018-01-02 DIAGNOSIS — M545 Low back pain: Secondary | ICD-10-CM | POA: Diagnosis not present

## 2018-01-02 DIAGNOSIS — M2560 Stiffness of unspecified joint, not elsewhere classified: Secondary | ICD-10-CM | POA: Diagnosis not present

## 2018-01-02 DIAGNOSIS — M546 Pain in thoracic spine: Secondary | ICD-10-CM | POA: Diagnosis not present

## 2018-01-08 DIAGNOSIS — M545 Low back pain: Secondary | ICD-10-CM | POA: Diagnosis not present

## 2018-01-08 DIAGNOSIS — M546 Pain in thoracic spine: Secondary | ICD-10-CM | POA: Diagnosis not present

## 2018-01-08 DIAGNOSIS — M2560 Stiffness of unspecified joint, not elsewhere classified: Secondary | ICD-10-CM | POA: Diagnosis not present

## 2018-01-09 DIAGNOSIS — M546 Pain in thoracic spine: Secondary | ICD-10-CM | POA: Diagnosis not present

## 2018-01-09 DIAGNOSIS — M2560 Stiffness of unspecified joint, not elsewhere classified: Secondary | ICD-10-CM | POA: Diagnosis not present

## 2018-01-09 DIAGNOSIS — M545 Low back pain: Secondary | ICD-10-CM | POA: Diagnosis not present

## 2018-01-19 ENCOUNTER — Ambulatory Visit (INDEPENDENT_AMBULATORY_CARE_PROVIDER_SITE_OTHER): Payer: BLUE CROSS/BLUE SHIELD | Admitting: Physician Assistant

## 2018-01-19 ENCOUNTER — Other Ambulatory Visit: Payer: Self-pay

## 2018-01-19 ENCOUNTER — Encounter: Payer: Self-pay | Admitting: Physician Assistant

## 2018-01-19 VITALS — BP 121/75 | HR 70 | Temp 97.8°F | Resp 16 | Ht 66.18 in | Wt 265.2 lb

## 2018-01-19 DIAGNOSIS — H5789 Other specified disorders of eye and adnexa: Secondary | ICD-10-CM

## 2018-01-19 DIAGNOSIS — M546 Pain in thoracic spine: Secondary | ICD-10-CM | POA: Diagnosis not present

## 2018-01-19 DIAGNOSIS — M545 Low back pain: Secondary | ICD-10-CM | POA: Diagnosis not present

## 2018-01-19 DIAGNOSIS — M2560 Stiffness of unspecified joint, not elsewhere classified: Secondary | ICD-10-CM | POA: Diagnosis not present

## 2018-01-19 MED ORDER — POLYMYXIN B-TRIMETHOPRIM 10000-0.1 UNIT/ML-% OP SOLN: 1.0000 [drp] | Freq: Four times a day (QID) | OPHTHALMIC | 0 refills | Status: DC

## 2018-01-19 MED ORDER — OLOPATADINE HCL 0.2 % OP SOLN: 1.0000 [drp] | Freq: Every day | OPHTHALMIC | 3 refills | Status: DC | PRN

## 2018-01-19 NOTE — Progress Notes (Signed)
01/20/2018 12:58 PM   DOB: [birth year] / MRN: [id number]  SUBJECTIVE:  [name] is a 57 y.o. female presenting for right eye watering initially which has since resolved and now the left eye is starting to water. Symptoms present for about 1 week.  The problem is waxing and waning, but worse today. She has tried some old drops but found these were out of date. Denies change in vision, photophobia, HA.   She is allergic to crestor [rosuvastatin calcium]; norvasc [amlodipine]; and pravastatin.   She  has a past medical history of Abdominal pain, Allergy, Anemia, Cancer ([place]), Costochondritis, Diabetes mellitus, GERD (gastroesophageal reflux disease), Hyperlipidemia, Hypertension, and Numbness of fingers.    She  reports that she quit smoking about 36 years ago. She has never used smokeless tobacco. She reports that she drinks about 0.6 oz of alcohol per week. She reports that she does not use drugs. She  reports that she currently engages in sexual activity and has had partners who are Female. The patient  has a past surgical history that includes Cholecystectomy; Splenectomy, partial (01/09/2011); Pancreatectomy (01/09/2011); Cesarean section; Cystectomy; Breast biopsy; Abdominal hysterectomy; and Breast excisional biopsy (Left).  Her family history includes Cancer (age of onset: 4) in her mother; Diabetes in her mother; Heart disease in her father; Stroke (age of onset: 74) in her mother.  Review of Systems  Constitutional: Negative for chills, diaphoresis and fever.  Eyes: Positive for discharge. Negative for blurred vision, double vision, photophobia, pain and redness.  Respiratory: Negative for cough, hemoptysis, sputum production, shortness of breath and wheezing.   Cardiovascular: Negative for chest pain, orthopnea and leg swelling.  Gastrointestinal: Negative for abdominal pain, blood in stool, constipation, diarrhea, heartburn, melena, nausea and vomiting.  Genitourinary: Negative for  dysuria, flank pain, frequency, hematuria and urgency.  Skin: Negative for rash.  Neurological: Negative for dizziness, sensory change, speech change, focal weakness and headaches.    The problem list and medications were reviewed and updated by myself where necessary and exist elsewhere in the encounter.   OBJECTIVE:  BP 121/75   Pulse 70   Temp 97.8 F (36.6 C)   Resp 16   Ht 5' 6.18" (1.681 m)   Wt 265 lb 2.9 oz (120.3 kg)   SpO2 99%   BMI 42.57 kg/m   Wt Readings from Last 3 Encounters:  01/19/18 265 lb 2.9 oz (120.3 kg)  12/01/17 255 lb 6.4 oz (115.8 kg)  07/07/17 258 lb 3.2 oz (117.1 kg)   Temp Readings from Last 3 Encounters:  01/19/18 97.8 F (36.6 C)  12/01/17 98.8 F (37.1 C) (Oral)  07/07/17 98.2 F (36.8 C) (Oral)   BP Readings from Last 3 Encounters:  01/19/18 121/75  12/01/17 124/72  07/07/17 (!) 168/103   Pulse Readings from Last 3 Encounters:  01/19/18 70  12/01/17 73  07/07/17 63    Physical Exam  Constitutional: She is oriented to person, place, and time. She is active.  Non-toxic appearance.  Eyes: Pupils are equal, round, and reactive to light. Conjunctivae and EOM are normal. Right eye exhibits no discharge. Left eye exhibits no discharge.  Cardiovascular: Normal rate.  Pulmonary/Chest: Effort normal. No tachypnea.  Neurological: She is alert and oriented to person, place, and time. She has normal strength and normal reflexes. She is not disoriented. She displays no atrophy. No cranial nerve deficit or sensory deficit. She exhibits normal muscle tone. Coordination and gait normal.  Skin: Skin is warm and dry.  She is not diaphoretic. No pallor.  Psychiatric: Her behavior is normal.    Lab Results  Component Value Date   HGBA1C 7.5 (H) 12/01/2017    Lab Results  Component Value Date   WBC 12.0 (H) 12/01/2017   HGB 12.1 12/01/2017   HCT 38.4 12/01/2017   MCV 81 12/01/2017   PLT 403 12/01/2017    Lab Results  Component Value Date     CREATININE 0.75 12/01/2017   BUN 16 12/01/2017   NA 141 12/01/2017   K 4.1 12/01/2017   CL 99 12/01/2017   CO2 25 12/01/2017    Lab Results  Component Value Date   ALT 22 12/01/2017   AST 14 12/01/2017   ALKPHOS 72 12/01/2017   BILITOT 0.3 12/01/2017    Lab Results  Component Value Date   TSH 4.230 12/01/2017    Lab Results  Component Value Date   CHOL 202 (H) 12/01/2017   HDL 67 12/01/2017   LDLCALC 109 (H) 12/01/2017   TRIG 129 12/01/2017   CHOLHDL 3.0 12/01/2017     ASSESSMENT AND PLAN:  [name] was seen today for conjunctivitis.  Diagnoses and all orders for this visit:  Eye irritation: Most likely allergic in nature.  She will try the olopatadine.  Fill Polytrim if any exudate. -     trimethoprim-polymyxin b (POLYTRIM) ophthalmic solution; Place 1 drop into both eyes every 6 (six) hours. -     Olopatadine HCl 0.2 % SOLN; Apply 1 drop to eye daily as needed.    The patient is advised to call or return to clinic if she does not see an improvement in symptoms, or to seek the care of the closest emergency department if she worsens with the above plan.   Philis Fendt, MHS, PA-C [facility] 01/20/2018 12:58 PM

## 2018-01-19 NOTE — Patient Instructions (Signed)
     IF you received an x-ray today, you will receive an invoice from [facility]. Please contact [facility] at [phone] with questions or concerns regarding your invoice.   IF you received labwork today, you will receive an invoice from [facility]. Please contact [facility] at [phone] with questions or concerns regarding your invoice.   Our billing staff will not be able to assist you with questions regarding bills from these companies.  You will be contacted with the lab results as soon as they are available. The fastest way to get your results is to activate your My Chart account. Instructions are located on the last page of this paperwork. If you have not heard from us regarding the results in 2 weeks, please contact this office.     

## 2018-01-31 ENCOUNTER — Other Ambulatory Visit: Payer: Self-pay | Admitting: Physician Assistant

## 2018-01-31 DIAGNOSIS — I10 Essential (primary) hypertension: Secondary | ICD-10-CM

## 2018-02-03 ENCOUNTER — Other Ambulatory Visit: Payer: Self-pay | Admitting: *Deleted

## 2018-02-09 DIAGNOSIS — R1012 Left upper quadrant pain: Secondary | ICD-10-CM | POA: Diagnosis not present

## 2018-02-13 ENCOUNTER — Other Ambulatory Visit: Payer: Self-pay | Admitting: Physician Assistant

## 2018-02-13 DIAGNOSIS — I10 Essential (primary) hypertension: Secondary | ICD-10-CM

## 2018-03-25 ENCOUNTER — Encounter: Payer: Self-pay | Admitting: Physician Assistant

## 2018-03-26 ENCOUNTER — Other Ambulatory Visit: Payer: Self-pay

## 2018-03-26 DIAGNOSIS — E1165 Type 2 diabetes mellitus with hyperglycemia: Secondary | ICD-10-CM

## 2018-03-26 MED ORDER — METFORMIN HCL ER 500 MG PO TB24: 2000.0000 mg | ORAL_TABLET | Freq: Every day | ORAL | 0 refills | Status: DC

## 2018-04-14 DIAGNOSIS — H35033 Hypertensive retinopathy, bilateral: Secondary | ICD-10-CM | POA: Diagnosis not present

## 2018-04-14 DIAGNOSIS — H2513 Age-related nuclear cataract, bilateral: Secondary | ICD-10-CM | POA: Diagnosis not present

## 2018-04-14 DIAGNOSIS — H524 Presbyopia: Secondary | ICD-10-CM | POA: Diagnosis not present

## 2018-04-14 DIAGNOSIS — H5211 Myopia, right eye: Secondary | ICD-10-CM | POA: Diagnosis not present

## 2018-04-14 DIAGNOSIS — E119 Type 2 diabetes mellitus without complications: Secondary | ICD-10-CM | POA: Diagnosis not present

## 2018-04-14 DIAGNOSIS — H40053 Ocular hypertension, bilateral: Secondary | ICD-10-CM | POA: Diagnosis not present

## 2018-04-14 DIAGNOSIS — H52222 Regular astigmatism, left eye: Secondary | ICD-10-CM | POA: Diagnosis not present

## 2018-04-14 DIAGNOSIS — H5212 Myopia, left eye: Secondary | ICD-10-CM | POA: Diagnosis not present

## 2018-04-14 LAB — HM DIABETES EYE EXAM

## 2018-04-17 NOTE — Progress Notes (Signed)
Hypertension retinopathy

## 2018-06-01 ENCOUNTER — Encounter: Payer: Self-pay | Admitting: Physician Assistant

## 2018-06-02 ENCOUNTER — Other Ambulatory Visit: Payer: Self-pay | Admitting: *Deleted

## 2018-06-02 DIAGNOSIS — I10 Essential (primary) hypertension: Secondary | ICD-10-CM

## 2018-06-02 MED ORDER — LISINOPRIL-HYDROCHLOROTHIAZIDE 20-25 MG PO TABS
1.0000 | ORAL_TABLET | Freq: Every day | ORAL | 0 refills | Status: DC
Start: 2018-06-02 — End: 2018-07-14

## 2018-06-09 ENCOUNTER — Encounter: Payer: Self-pay | Admitting: Physician Assistant

## 2018-06-10 ENCOUNTER — Other Ambulatory Visit: Payer: Self-pay | Admitting: *Deleted

## 2018-06-10 DIAGNOSIS — E1165 Type 2 diabetes mellitus with hyperglycemia: Secondary | ICD-10-CM

## 2018-06-10 DIAGNOSIS — I10 Essential (primary) hypertension: Secondary | ICD-10-CM

## 2018-06-10 MED ORDER — METOPROLOL SUCCINATE ER 200 MG PO TB24: ORAL_TABLET | ORAL | 0 refills | Status: DC

## 2018-06-10 MED ORDER — METFORMIN HCL ER 500 MG PO TB24
2000.0000 mg | ORAL_TABLET | Freq: Every day | ORAL | 0 refills | Status: DC
Start: 2018-06-10 — End: 2019-10-14

## 2018-06-22 DIAGNOSIS — Z23 Encounter for immunization: Secondary | ICD-10-CM | POA: Diagnosis not present

## 2018-06-27 DIAGNOSIS — J019 Acute sinusitis, unspecified: Secondary | ICD-10-CM | POA: Diagnosis not present

## 2018-07-03 ENCOUNTER — Ambulatory Visit (INDEPENDENT_AMBULATORY_CARE_PROVIDER_SITE_OTHER): Payer: BLUE CROSS/BLUE SHIELD | Admitting: Emergency Medicine

## 2018-07-03 ENCOUNTER — Other Ambulatory Visit: Payer: Self-pay

## 2018-07-03 ENCOUNTER — Encounter: Payer: Self-pay | Admitting: Emergency Medicine

## 2018-07-03 VITALS — BP 138/80 | HR 75 | Temp 99.0°F | Resp 20 | Ht 66.73 in | Wt 262.0 lb

## 2018-07-03 DIAGNOSIS — R059 Cough, unspecified: Secondary | ICD-10-CM | POA: Insufficient documentation

## 2018-07-03 DIAGNOSIS — R0989 Other specified symptoms and signs involving the circulatory and respiratory systems: Secondary | ICD-10-CM

## 2018-07-03 DIAGNOSIS — J988 Other specified respiratory disorders: Secondary | ICD-10-CM

## 2018-07-03 DIAGNOSIS — B9789 Other viral agents as the cause of diseases classified elsewhere: Secondary | ICD-10-CM | POA: Diagnosis not present

## 2018-07-03 DIAGNOSIS — R05 Cough: Secondary | ICD-10-CM

## 2018-07-03 MED ORDER — BENZONATATE 200 MG PO CAPS: 200.0000 mg | ORAL_CAPSULE | Freq: Two times a day (BID) | ORAL | 0 refills | Status: DC | PRN

## 2018-07-03 MED ORDER — ALBUTEROL SULFATE HFA 108 (90 BASE) MCG/ACT IN AERS: INHALATION_SPRAY | RESPIRATORY_TRACT | 2 refills | Status: DC

## 2018-07-03 MED ORDER — PROMETHAZINE-CODEINE 6.25-10 MG/5ML PO SYRP: 5.0000 mL | ORAL_SOLUTION | Freq: Every evening | ORAL | 0 refills | Status: DC | PRN

## 2018-07-03 NOTE — Patient Instructions (Addendum)
If you have lab work done today you will be contacted with your lab results within the next 2 weeks.  If you have not heard from [place] then please contact us. The fastest way to get your results is to register for My Chart.   IF you received an x-ray today, you will receive an invoice from [facility]. Please contact [facility] at [phone] with questions or concerns regarding your invoice.   IF you received labwork today, you will receive an invoice from . Please contact [facility] at [phone] with questions or concerns regarding your invoice.   Our billing staff will not be able to assist you with questions regarding bills from these companies.  You will be contacted with the lab results as soon as they are available. The fastest way to get your results is to activate your My Chart account. Instructions are located on the last page of this paperwork. If you have not heard from [place] regarding the results in 2 weeks, please contact this office.     Viral Respiratory Infection A viral respiratory infection is an illness that affects parts of the body that are used for breathing. These include the lungs, nose, and throat. It is caused by a germ called a virus. Some examples of this kind of infection are:  A cold.  The flu (influenza).  A respiratory syncytial virus (RSV) infection. A person who gets this illness may have the following symptoms:  A stuffy or runny nose.  Yellow or green fluid in the nose.  A cough.  Sneezing.  Tiredness (fatigue).  Achy muscles.  A sore throat.  Sweating or chills.  A fever.  A headache. Follow these instructions at home: Managing pain and congestion  Take over-the-counter and prescription medicines only as told by your doctor.  If you have a sore throat, gargle with salt water. Do this 3-4 times per day or as needed. To make a salt-water mixture, dissolve -1 tsp of salt in 1 cup of warm water. Make  sure that all the salt dissolves.  Use nose drops made from salt water. This helps with stuffiness (congestion). It also helps soften the skin around your nose.  Drink enough fluid to keep your pee (urine) pale yellow. General instructions   Rest as much as possible.  Do not drink alcohol.  Do not use any products that have nicotine or tobacco, such as cigarettes and e-cigarettes. If you need help quitting, ask your doctor.  Keep all follow-up visits as told by your doctor. This is important. How is this prevented?   Get a flu shot every year. Ask your doctor when you should get your flu shot.  Do not let other people get your germs. If you are sick: ? Stay home from work or school. ? Wash your hands with soap and water often. Wash your hands after you cough or sneeze. If soap and water are not available, use hand sanitizer.  Avoid contact with people who are sick during cold and flu season. This is in fall and winter. Get help if:  Your symptoms last for 10 days or longer.  Your symptoms get worse over time.  You have a fever.  You have very bad pain in your face or forehead.  Parts of your jaw or neck become very swollen. Get help right away if:  You feel pain or pressure in your chest.  You have shortness of breath.  You faint or feel like you  will faint.  You keep throwing up (vomiting).  You feel confused. Summary  A viral respiratory infection is an illness that affects parts of the body that are used for breathing.  Examples of this illness include a cold, the flu, and respiratory syncytial virus (RSV) infection.  The infection can cause a runny nose, cough, sneezing, sore throat, and fever.  Follow what your doctor tells you about taking medicines, drinking lots of fluid, washing your hands, resting at home, and avoiding people who are sick. This information is not intended to replace advice given to you by your health care provider. Make sure you  discuss any questions you have with your health care provider. Document Released: 06/13/2008 Document Revised: 08/11/2017 Document Reviewed: 08/11/2017 Elsevier Interactive Patient Education  2019 [employer].

## 2018-07-03 NOTE — Progress Notes (Signed)
[name] 57 y.o.   Chief Complaint  Patient presents with  . Cough    X 1 week- had fever but got better    HISTORY OF PRESENT ILLNESS: This is a 57 y.o. female complaining of one-week history of cough with some shortness of breath.  Had fever but is better now.  Feels about the same as day 1.  Diabetic and hypertensive.  Has been taking over-the-counter medication.  Took Levaquin for 5 days with no significant difference.  Patient has a history of splenectomy.  Denies any other significant symptoms.  HPI   Prior to Admission medications   Medication Sig Start Date End Date Taking? Authorizing Provider  aspirin EC 81 MG tablet Take 1 tablet (81 mg total) by mouth daily. Patient not taking: Reported on 07/03/2018 05/05/17   Tereasa Coop, PA-C  Dulaglutide (TRULICITY) 7.26 OM/3.5DH SOPN Inject 0.75 mg into the skin once a week. Patient not taking: Reported on 07/03/2018 12/16/17   Mancel Bale, PA-C  gabapentin (NEURONTIN) 100 MG capsule Take 1 capsule (100 mg total) by mouth 3 (three) times daily. Patient not taking: Reported on 07/03/2018 12/01/17   Windell Hummingbird L, PA-C  glucose blood (ONE TOUCH ULTRA TEST) test strip Test blood sugar daily. Patient not taking: Reported on 07/03/2018 12/01/17   Gale Journey, Damaris Hippo, PA-C  Insulin Glargine (LANTUS SOLOSTAR) 100 UNIT/ML Solostar Pen INJECT 10 UNITS SUBCUTANEOUSLY ONCE DAILY AT  10  PM Patient not taking: Reported on 07/03/2018 12/01/17   Gale Journey, Damaris Hippo, PA-C  Insulin Pen Needle (RELION PEN NEEDLES) 31G X 6 MM MISC USE 1 PEN NEEDLE AS NEEDED Patient not taking: Reported on 07/03/2018 12/01/17   Gale Journey, Damaris Hippo, PA-C  lisinopril-hydrochlorothiazide (PRINZIDE,ZESTORETIC) 20-25 MG tablet Take 1 tablet by mouth daily. Patient not taking: Reported on 07/03/2018 06/02/18   Rutherford Guys, MD  metFORMIN (GLUCOPHAGE XR) 500 MG 24 hr tablet Take 4 tablets (2,000 mg total) by mouth daily with breakfast. Office visit needed for 90 day  supply Patient not taking: Reported on 07/03/2018 06/10/18   Forrest Moron, MD  metoprolol (TOPROL-XL) 200 MG 24 hr tablet TAKE 1 TABLET BY MOUTH ONCE DAILY WITH  OR  IMMEDIATELY  FOLLOWING  A  MEAL Patient not taking: Reported on 07/03/2018 06/10/18   Forrest Moron, MD  naproxen sodium (ALEVE) 220 MG tablet Take 440 mg by mouth daily as needed (pain).    [provider]  Olopatadine HCl 0.2 % SOLN Apply 1 drop to eye daily as needed. Patient not taking: Reported on 07/03/2018 01/19/18   Tereasa Coop, PA-C  pantoprazole (PROTONIX) 40 MG tablet Take 1 tablet (40 mg total) by mouth daily. Patient not taking: Reported on 07/03/2018 12/01/17   Mancel Bale, PA-C  trimethoprim-polymyxin b (POLYTRIM) ophthalmic solution Place 1 drop into both eyes every 6 (six) hours. Patient not taking: Reported on 07/03/2018 01/19/18   Tereasa Coop, PA-C    Allergies  Allergen Reactions  . Crestor [Rosuvastatin Calcium] Other (See Comments)    myalgias  . Norvasc [Amlodipine] Nausea Only  . Pravastatin     myalgia    Patient Active Problem List   Diagnosis Date Noted  . Status post splenectomy 12/01/2017  . Retinopathy 10/10/2015  . Pure hypercholesterolemia 12/21/2014  . HTN (hypertension) 06/13/2012  . Diabetes mellitus type II, uncontrolled ([place]) 11/14/2011  . Diabetic peripheral neuropathy associated with type 2 diabetes mellitus ([place]) 11/14/2011  . Obesity, Class III, BMI 40-49.9 (  morbid obesity) ([place]) 11/14/2011  . Menopause 11/14/2011  . Mass on back 10/28/2011  . Pleural effusion 03/20/2011  . Status post cholecystectomy 01/24/2011  . Serous cystadenoma of pancreas 01/24/2011    Past Medical History:  Diagnosis Date  . Abdominal pain    pain around incision area   . Allergy   . Anemia   . Cancer ([place])   . Costochondritis   . Diabetes mellitus   . GERD (gastroesophageal reflux disease)   . Hyperlipidemia   . Hypertension   . Numbness of fingers     Past  Surgical History:  Procedure Laterality Date  . ABDOMINAL HYSTERECTOMY     partial  . BREAST BIOPSY     left  . BREAST EXCISIONAL BIOPSY Left   . CESAREAN SECTION    . CHOLECYSTECTOMY    . CYSTECTOMY    . PANCREATECTOMY  01/09/2011  . SPLENECTOMY, PARTIAL  01/09/2011    Social History   Socioeconomic History  . Marital status: Married    Spouse name: Not on file  . Number of children: 3  . Years of education: Not on file  . Highest education level: Not on file  Occupational History  . Not on file  Social Needs  . Financial resource strain: Not on file  . Food insecurity:    Worry: Not on file    Inability: Not on file  . Transportation needs:    Medical: Not on file    Non-medical: Not on file  Tobacco Use  . Smoking status: Former Smoker    Last attempt to quit: 12/19/1981    Years since quitting: 36.5  . Smokeless tobacco: Never Used  Substance and Sexual Activity  . Alcohol use: Yes    Alcohol/week: 1.0 standard drinks    Types: 1 Standard drinks or equivalent per week  . Drug use: No  . Sexual activity: Yes    Partners: Male  Lifestyle  . Physical activity:    Days per week: Not on file    Minutes per session: Not on file  . Stress: Not on file  Relationships  . Social connections:    Talks on phone: Not on file    Gets together: Not on file    Attends religious service: Not on file    Active member of club or organization: Not on file    Attends meetings of clubs or organizations: Not on file    Relationship status: Not on file  . Intimate partner violence:    Fear of current or ex partner: Not on file    Emotionally abused: Not on file    Physically abused: Not on file    Forced sexual activity: Not on file  Other Topics Concern  . Not on file  Social History Narrative  . Not on file    Family History  Problem Relation Age of Onset  . Heart disease Father        died at age 41  . Stroke Mother 64       per pt stroke from high doses of iron  injections  . Cancer Mother 101       cervical  . Diabetes Mother      Review of Systems  Constitutional: Positive for fever and malaise/fatigue.  HENT: Positive for sore throat.   Eyes: Negative for discharge and redness.  Respiratory: Positive for cough, shortness of breath and wheezing. Negative for hemoptysis and sputum production.   Cardiovascular: Negative.  Negative  for chest pain and palpitations.  Gastrointestinal: Negative for abdominal pain, diarrhea, nausea and vomiting.  Genitourinary: Negative for dysuria and hematuria.  Musculoskeletal: Negative for back pain, myalgias and neck pain.  Skin: Negative for rash.  Neurological: Negative.  Negative for dizziness and headaches.  All other systems reviewed and are negative.   Vitals:   07/03/18 1524 07/03/18 1556  BP: (!) 157/86 138/80  Pulse: 75   Resp: 20   Temp: 99 F (37.2 C)   SpO2: 98%     Physical Exam Vitals signs reviewed.  Constitutional:      Appearance: Normal appearance. She is obese.  HENT:     Head: Normocephalic and atraumatic.     Nose: Nose normal.     Mouth/Throat:     Mouth: Mucous membranes are moist.     Pharynx: Oropharynx is clear.  Eyes:     Extraocular Movements: Extraocular movements intact.     Conjunctiva/sclera: Conjunctivae normal.     Pupils: Pupils are equal, round, and reactive to light.  Neck:     Musculoskeletal: Normal range of motion.  Cardiovascular:     Rate and Rhythm: Normal rate and regular rhythm.     Heart sounds: Normal heart sounds.  Pulmonary:     Effort: Pulmonary effort is normal. No respiratory distress.     Breath sounds: Normal breath sounds. No wheezing or rales.  Abdominal:     Tenderness: There is no abdominal tenderness.  Musculoskeletal: Normal range of motion.  Lymphadenopathy:     Cervical: No cervical adenopathy.  Skin:    General: Skin is warm and dry.     Capillary Refill: Capillary refill takes less than 2 seconds.  Neurological:      General: No focal deficit present.     Mental Status: She is alert and oriented to person, place, and time.  Psychiatric:        Mood and Affect: Mood normal.        Behavior: Behavior normal.      ASSESSMENT & PLAN: [place] was seen today for cough.  Diagnoses and all orders for this visit:  Viral respiratory infection  Cough -     promethazine-codeine (PHENERGAN WITH CODEINE) 6.25-10 MG/5ML syrup; Take 5 mLs by mouth at bedtime as needed for cough. -     benzonatate (TESSALON) 200 MG capsule; Take 1 capsule (200 mg total) by mouth 2 (two) times daily as needed for cough.  Chest congestion -     albuterol (PROVENTIL HFA;VENTOLIN HFA) 108 (90 Base) MCG/ACT inhaler; Sig 2 puffs twice a day for 5 days.    Patient Instructions       If you have lab work done today you will be contacted with your lab results within the next 2 weeks.  If you have not heard from [place] then please contact us. The fastest way to get your results is to register for My Chart.   IF you received an x-ray today, you will receive an invoice from [facility]. Please contact [facility] at [phone] with questions or concerns regarding your invoice.   IF you received labwork today, you will receive an invoice from [facility]. Please contact [facility] at [phone] with questions or concerns regarding your invoice.   Our billing staff will not be able to assist you with questions regarding bills from these companies.  You will be contacted with the lab results as soon as they are available. The fastest way to get your results is to  activate your My Chart account. Instructions are located on the last page of this paperwork. If you have not heard from [place] regarding the results in 2 weeks, please contact this office.     Viral Respiratory Infection A viral respiratory infection is an illness that affects parts of the body that are used for breathing. These include the lungs, nose, and throat.  It is caused by a germ called a virus. Some examples of this kind of infection are:  A cold.  The flu (influenza).  A respiratory syncytial virus (RSV) infection. A person who gets this illness may have the following symptoms:  A stuffy or runny nose.  Yellow or green fluid in the nose.  A cough.  Sneezing.  Tiredness (fatigue).  Achy muscles.  A sore throat.  Sweating or chills.  A fever.  A headache. Follow these instructions at home: Managing pain and congestion  Take over-the-counter and prescription medicines only as told by your doctor.  If you have a sore throat, gargle with salt water. Do this 3-4 times per day or as needed. To make a salt-water mixture, dissolve -1 tsp of salt in 1 cup of warm water. Make sure that all the salt dissolves.  Use nose drops made from salt water. This helps with stuffiness (congestion). It also helps soften the skin around your nose.  Drink enough fluid to keep your pee (urine) pale yellow. General instructions   Rest as much as possible.  Do not drink alcohol.  Do not use any products that have nicotine or tobacco, such as cigarettes and e-cigarettes. If you need help quitting, ask your doctor.  Keep all follow-up visits as told by your doctor. This is important. How is this prevented?   Get a flu shot every year. Ask your doctor when you should get your flu shot.  Do not let other people get your germs. If you are sick: ? Stay home from work or school. ? Wash your hands with soap and water often. Wash your hands after you cough or sneeze. If soap and water are not available, use hand sanitizer.  Avoid contact with people who are sick during cold and flu season. This is in fall and winter. Get help if:  Your symptoms last for 10 days or longer.  Your symptoms get worse over time.  You have a fever.  You have very bad pain in your face or forehead.  Parts of your jaw or neck become very swollen. Get help  right away if:  You feel pain or pressure in your chest.  You have shortness of breath.  You faint or feel like you will faint.  You keep throwing up (vomiting).  You feel confused. Summary  A viral respiratory infection is an illness that affects parts of the body that are used for breathing.  Examples of this illness include a cold, the flu, and respiratory syncytial virus (RSV) infection.  The infection can cause a runny nose, cough, sneezing, sore throat, and fever.  Follow what your doctor tells you about taking medicines, drinking lots of fluid, washing your hands, resting at home, and avoiding people who are sick. This information is not intended to replace advice given to you by your health care provider. Make sure you discuss any questions you have with your health care provider. Document Released: 06/13/2008 Document Revised: 08/11/2017 Document Reviewed: 08/11/2017 Elsevier Interactive Patient Education  2019 [employer]      Agustina Caroli, MD [facility]

## 2018-07-10 ENCOUNTER — Other Ambulatory Visit: Payer: Self-pay | Admitting: Physician Assistant

## 2018-07-13 ENCOUNTER — Other Ambulatory Visit: Payer: Self-pay | Admitting: Family Medicine

## 2018-07-13 DIAGNOSIS — I10 Essential (primary) hypertension: Secondary | ICD-10-CM

## 2018-07-13 NOTE — Telephone Encounter (Signed)
Copied from [place] [id number]. Topic: Quick Communication - Rx Refill/Question >> Jul 13, 2018  9:00 AM Rayann Heman wrote: Medication: lisinopril-hydrochlorothiazide (PRINZIDE,ZESTORETIC) 20-25 MG tablet [[id number]] Dulaglutide (TRULICITY) 1.82 XH/3.7JI SOPN [[id number]] pt is completley out and would like enough until new pt appointment. Please advise   Has the patient contacted their pharmacy? no Preferred Pharmacy (with phone number or street name):[facility] ([address] [place] [phone] (Phone) [phone] (Fax)   Agent: Please be advised that RX refills may take up to 3 business days. We ask that you follow-up with your pharmacy.

## 2018-07-13 NOTE — Telephone Encounter (Signed)
Please Advise

## 2018-07-13 NOTE — Telephone Encounter (Signed)
Patient is requesting the following medications: lisinopril-hydrochlorothiazide Dulaglutide / Patient reported at [facility] on 07-03-18 that she is not taking these medications. / I am not sure if they should be refilled or not.  Will send to provider for decision

## 2018-07-14 ENCOUNTER — Other Ambulatory Visit: Payer: Self-pay | Admitting: Family Medicine

## 2018-07-14 DIAGNOSIS — I10 Essential (primary) hypertension: Secondary | ICD-10-CM

## 2018-07-14 NOTE — Telephone Encounter (Signed)
Courtesy refill until appointment 08/17/18

## 2018-07-16 DIAGNOSIS — I10 Essential (primary) hypertension: Secondary | ICD-10-CM | POA: Diagnosis not present

## 2018-07-16 DIAGNOSIS — E7800 Pure hypercholesterolemia, unspecified: Secondary | ICD-10-CM | POA: Diagnosis not present

## 2018-07-16 DIAGNOSIS — R12 Heartburn: Secondary | ICD-10-CM | POA: Diagnosis not present

## 2018-07-16 DIAGNOSIS — E1165 Type 2 diabetes mellitus with hyperglycemia: Secondary | ICD-10-CM | POA: Diagnosis not present

## 2018-08-17 ENCOUNTER — Ambulatory Visit: Payer: BLUE CROSS/BLUE SHIELD | Admitting: Family Medicine

## 2018-08-30 ENCOUNTER — Other Ambulatory Visit: Payer: Self-pay | Admitting: Family Medicine

## 2018-08-30 DIAGNOSIS — I10 Essential (primary) hypertension: Secondary | ICD-10-CM

## 2018-08-31 NOTE — Telephone Encounter (Signed)
Please review for refill of lisinopril-hydrochlorothiazide 20-25 mg. Courtesy refill given 07/14/2018. Last office visit was no show 08/17/2018. LOV  07/03/18

## 2018-10-26 DIAGNOSIS — Z789 Other specified health status: Secondary | ICD-10-CM | POA: Diagnosis not present

## 2018-10-26 DIAGNOSIS — E7800 Pure hypercholesterolemia, unspecified: Secondary | ICD-10-CM | POA: Diagnosis not present

## 2018-10-26 DIAGNOSIS — I10 Essential (primary) hypertension: Secondary | ICD-10-CM | POA: Diagnosis not present

## 2018-10-26 DIAGNOSIS — E1159 Type 2 diabetes mellitus with other circulatory complications: Secondary | ICD-10-CM | POA: Diagnosis not present

## 2018-10-26 DIAGNOSIS — E1169 Type 2 diabetes mellitus with other specified complication: Secondary | ICD-10-CM | POA: Diagnosis not present

## 2018-11-30 ENCOUNTER — Other Ambulatory Visit: Payer: Self-pay | Admitting: Physician Assistant

## 2018-11-30 DIAGNOSIS — Z1231 Encounter for screening mammogram for malignant neoplasm of breast: Secondary | ICD-10-CM

## 2018-12-01 DIAGNOSIS — E1169 Type 2 diabetes mellitus with other specified complication: Secondary | ICD-10-CM | POA: Diagnosis not present

## 2018-12-01 DIAGNOSIS — I10 Essential (primary) hypertension: Secondary | ICD-10-CM | POA: Diagnosis not present

## 2018-12-01 DIAGNOSIS — E1159 Type 2 diabetes mellitus with other circulatory complications: Secondary | ICD-10-CM | POA: Diagnosis not present

## 2018-12-01 DIAGNOSIS — N644 Mastodynia: Secondary | ICD-10-CM | POA: Diagnosis not present

## 2018-12-01 DIAGNOSIS — E1165 Type 2 diabetes mellitus with hyperglycemia: Secondary | ICD-10-CM | POA: Diagnosis not present

## 2018-12-15 ENCOUNTER — Other Ambulatory Visit: Payer: Self-pay | Admitting: Physician Assistant

## 2018-12-15 DIAGNOSIS — N644 Mastodynia: Secondary | ICD-10-CM

## 2018-12-17 ENCOUNTER — Other Ambulatory Visit: Payer: Self-pay

## 2018-12-17 ENCOUNTER — Ambulatory Visit: Payer: BLUE CROSS/BLUE SHIELD

## 2018-12-17 ENCOUNTER — Ambulatory Visit
Admission: RE | Admit: 2018-12-17 | Discharge: 2018-12-17 | Disposition: A | Discharge status: Home / Self Care | Payer: BLUE CROSS/BLUE SHIELD | Source: Ambulatory Visit | Attending: Physician Assistant | Admitting: Physician Assistant

## 2018-12-17 DIAGNOSIS — N644 Mastodynia: Secondary | ICD-10-CM

## 2018-12-17 DIAGNOSIS — R928 Other abnormal and inconclusive findings on diagnostic imaging of breast: Secondary | ICD-10-CM | POA: Diagnosis not present

## 2019-01-07 DIAGNOSIS — E785 Hyperlipidemia, unspecified: Secondary | ICD-10-CM | POA: Diagnosis not present

## 2019-01-07 DIAGNOSIS — E1159 Type 2 diabetes mellitus with other circulatory complications: Secondary | ICD-10-CM | POA: Diagnosis not present

## 2019-01-07 DIAGNOSIS — E1169 Type 2 diabetes mellitus with other specified complication: Secondary | ICD-10-CM | POA: Diagnosis not present

## 2019-01-07 DIAGNOSIS — I10 Essential (primary) hypertension: Secondary | ICD-10-CM | POA: Diagnosis not present

## 2019-01-07 DIAGNOSIS — E1165 Type 2 diabetes mellitus with hyperglycemia: Secondary | ICD-10-CM | POA: Diagnosis not present

## 2019-04-13 DIAGNOSIS — I10 Essential (primary) hypertension: Secondary | ICD-10-CM | POA: Diagnosis not present

## 2019-04-13 DIAGNOSIS — E785 Hyperlipidemia, unspecified: Secondary | ICD-10-CM | POA: Diagnosis not present

## 2019-04-13 DIAGNOSIS — E1165 Type 2 diabetes mellitus with hyperglycemia: Secondary | ICD-10-CM | POA: Diagnosis not present

## 2019-04-13 DIAGNOSIS — E1169 Type 2 diabetes mellitus with other specified complication: Secondary | ICD-10-CM | POA: Diagnosis not present

## 2019-04-13 DIAGNOSIS — E1159 Type 2 diabetes mellitus with other circulatory complications: Secondary | ICD-10-CM | POA: Diagnosis not present

## 2019-04-13 DIAGNOSIS — Z9081 Acquired absence of spleen: Secondary | ICD-10-CM | POA: Diagnosis not present

## 2019-04-13 DIAGNOSIS — Z23 Encounter for immunization: Secondary | ICD-10-CM | POA: Diagnosis not present

## 2019-09-12 ENCOUNTER — Emergency Department (HOSPITAL_COMMUNITY)
Admission: EM | Admit: 2019-09-12 | Discharge: 2019-09-12 | Disposition: A | Discharge status: Home / Self Care | Payer: BC Managed Care – PPO | Attending: Emergency Medicine | Admitting: Emergency Medicine

## 2019-09-12 ENCOUNTER — Encounter (HOSPITAL_COMMUNITY): Payer: Self-pay

## 2019-09-12 ENCOUNTER — Emergency Department (HOSPITAL_COMMUNITY): Payer: BC Managed Care – PPO

## 2019-09-12 ENCOUNTER — Other Ambulatory Visit: Payer: Self-pay

## 2019-09-12 DIAGNOSIS — Z87891 Personal history of nicotine dependence: Secondary | ICD-10-CM | POA: Diagnosis not present

## 2019-09-12 DIAGNOSIS — R0600 Dyspnea, unspecified: Secondary | ICD-10-CM

## 2019-09-12 DIAGNOSIS — Z20822 Contact with and (suspected) exposure to covid-19: Secondary | ICD-10-CM | POA: Diagnosis not present

## 2019-09-12 DIAGNOSIS — E119 Type 2 diabetes mellitus without complications: Secondary | ICD-10-CM | POA: Insufficient documentation

## 2019-09-12 DIAGNOSIS — Z7984 Long term (current) use of oral hypoglycemic drugs: Secondary | ICD-10-CM | POA: Insufficient documentation

## 2019-09-12 DIAGNOSIS — Z79899 Other long term (current) drug therapy: Secondary | ICD-10-CM | POA: Insufficient documentation

## 2019-09-12 DIAGNOSIS — I10 Essential (primary) hypertension: Secondary | ICD-10-CM | POA: Diagnosis not present

## 2019-09-12 DIAGNOSIS — R0602 Shortness of breath: Secondary | ICD-10-CM | POA: Diagnosis not present

## 2019-09-12 LAB — CBC WITH DIFFERENTIAL/PLATELET
Abs Immature Granulocytes: 0.02 10*3/uL (ref 0.00–0.07)
Basophils Absolute: 0.1 10*3/uL (ref 0.0–0.1)
Basophils Relative: 0 %
Eosinophils Absolute: 0.2 10*3/uL (ref 0.0–0.5)
Eosinophils Relative: 2 %
HCT: 40.3 % (ref 36.0–46.0)
Hemoglobin: 12.6 g/dL (ref 12.0–15.0)
Immature Granulocytes: 0 %
Lymphocytes Relative: 45 %
Lymphs Abs: 5.3 10*3/uL — ABNORMAL HIGH (ref 0.7–4.0)
MCH: 25.3 pg — ABNORMAL LOW (ref 26.0–34.0)
MCHC: 31.3 g/dL (ref 30.0–36.0)
MCV: 80.8 fL (ref 80.0–100.0)
Monocytes Absolute: 0.8 10*3/uL (ref 0.1–1.0)
Monocytes Relative: 7 %
Neutro Abs: 5.4 10*3/uL (ref 1.7–7.7)
Neutrophils Relative %: 46 %
Platelets: 407 10*3/uL — ABNORMAL HIGH (ref 150–400)
RBC: 4.99 MIL/uL (ref 3.87–5.11)
RDW: 16.5 % — ABNORMAL HIGH (ref 11.5–15.5)
WBC: 11.9 10*3/uL — ABNORMAL HIGH (ref 4.0–10.5)
nRBC: 0 % (ref 0.0–0.2)

## 2019-09-12 LAB — TROPONIN I (HIGH SENSITIVITY)
Troponin I (High Sensitivity): 2 ng/L (ref <18)
Troponin I (High Sensitivity): 3 ng/L (ref <18)

## 2019-09-12 LAB — COMPREHENSIVE METABOLIC PANEL
ALT: 23 U/L (ref 0–44)
AST: 17 U/L (ref 15–41)
Albumin: 4.1 g/dL (ref 3.5–5.0)
Alkaline Phosphatase: 48 U/L (ref 38–126)
Anion gap: 10 (ref 5–15)
BUN: 16 mg/dL (ref 6–20)
CO2: 27 mmol/L (ref 22–32)
Calcium: 9.2 mg/dL (ref 8.9–10.3)
Chloride: 101 mmol/L (ref 98–111)
Creatinine, Ser: 0.85 mg/dL (ref 0.44–1.00)
GFR calc Af Amer: >60 mL/min (ref >60)
GFR calc non Af Amer: >60 mL/min (ref >60)
Glucose, Bld: 151 mg/dL — ABNORMAL HIGH (ref 70–99)
Potassium: 3.9 mmol/L (ref 3.5–5.1)
Sodium: 138 mmol/L (ref 135–145)
Total Bilirubin: 0.2 mg/dL — ABNORMAL LOW (ref 0.3–1.2)
Total Protein: 7.8 g/dL (ref 6.5–8.1)

## 2019-09-12 LAB — BRAIN NATRIURETIC PEPTIDE: B Natriuretic Peptide: 52.8 pg/mL (ref 0.0–100.0)

## 2019-09-12 LAB — RESPIRATORY PANEL BY RT PCR (FLU A&B, COVID)
Influenza A by PCR: NEGATIVE
Influenza B by PCR: NEGATIVE
SARS Coronavirus 2 by RT PCR: NEGATIVE

## 2019-09-12 LAB — POC SARS CORONAVIRUS 2 AG -  ED: SARS Coronavirus 2 Ag: NEGATIVE

## 2019-09-12 LAB — D-DIMER, QUANTITATIVE: D-Dimer, Quant: 0.34 ug/mL-FEU (ref 0.00–0.50)

## 2019-09-12 MED ORDER — ALBUTEROL SULFATE (2.5 MG/3ML) 0.083% IN NEBU: 5.0000 mg | INHALATION_SOLUTION | Freq: Once | RESPIRATORY_TRACT | Status: DC

## 2019-09-12 MED ORDER — ALBUTEROL SULFATE HFA 108 (90 BASE) MCG/ACT IN AERS
2.0000 | INHALATION_SPRAY | Freq: Once | RESPIRATORY_TRACT | Status: AC
  Administered 2019-09-12: 2 via RESPIRATORY_TRACT
  Filled 2019-09-12: qty 6.7

## 2019-09-12 NOTE — ED Triage Notes (Signed)
Pt c/o SOB with exertion for about a week. Pt denies fever, cough, headache. Pt states she has started 2 new medications for diabetes and she is not sure if its related.

## 2019-09-12 NOTE — Discharge Instructions (Addendum)
There is no evidence of heart attack, pneumonia, blood clot. Followup with your doctor for a stress test. Return to the ED if you develop exertional chest pain, shortness of breath, nausea, vomiting, sweating or any other concerns.   Use the inhaler every 4 hours for symptomatic relief.

## 2019-09-12 NOTE — ED Provider Notes (Signed)
Patient signed out to me at end of shift by Dr. Wyvonnia Dusky Here for evaluation of SoB, cough No fever, CP, N, V She complains of back pain but associates with cough  Pending COVID, troponin If negative, can be discharged home.  10:50 - Introduction and re-eval of the patient: She is feeling much better since use of Albuterol inhaler. No new complaints.   11:00 - COVID and delta trop both negative. CXR negative, no fever. She can be discharged home per plan of previous treatment team. Return precautions discussed.    Charlann Lange, PA-C 09/12/19 2304    Ezequiel Essex, MD 09/12/19 [phone]

## 2019-09-12 NOTE — ED Provider Notes (Signed)
[place] DEPT Provider Note   CSN: [id number] Arrival date & time: 09/12/19  1853     History Chief Complaint  Patient presents with  . Shortness of Breath    [name] is a 59 y.o. female.  Patient with hypertension, Diabetes here with 1 week of shortness of breath and cough. SOB worse with talking and worse with walking. Denies chest pain. Cough is nonproductive but feels like she needs to cough up something. Denies PND, orthopnea. No cardiac history. Take lisinopril and wonders if the cough could be from that. Also started on farxiga. No focal weakness, numbness, tingling. No known COVID exposures. Comes in tonight because she is concerned about pneumonia.  Her husband was also concerned that she had.  She denies any cardiac history, reports stress test was many years ago.  The history is provided by the patient.  Shortness of Breath Associated symptoms: cough   Associated symptoms: no abdominal pain, no chest pain, no fever, no headaches, no rash and no vomiting        Past Medical History:  Diagnosis Date  . Abdominal pain    pain around incision area   . Allergy   . Anemia   . Cancer ([place])   . Costochondritis   . Diabetes mellitus   . GERD (gastroesophageal reflux disease)   . Hyperlipidemia   . Hypertension   . Numbness of fingers     Patient Active Problem List   Diagnosis Date Noted  . Cough 07/03/2018  . Chest congestion 07/03/2018  . Viral respiratory infection 07/03/2018  . Status post splenectomy 12/01/2017  . Retinopathy 10/10/2015  . Pure hypercholesterolemia 12/21/2014  . HTN (hypertension) 06/13/2012  . Diabetes mellitus type II, uncontrolled ([place]) 11/14/2011  . Diabetic peripheral neuropathy associated with type 2 diabetes mellitus ([place]) 11/14/2011  . Obesity, Class III, BMI 40-49.9 (morbid obesity) ([place]) 11/14/2011  . Menopause 11/14/2011  . Mass on back 10/28/2011  . Pleural effusion 03/20/2011  . Status  post cholecystectomy 01/24/2011  . Serous cystadenoma of pancreas 01/24/2011    Past Surgical History:  Procedure Laterality Date  . ABDOMINAL HYSTERECTOMY     partial  . BREAST BIOPSY     left  . BREAST EXCISIONAL BIOPSY Left   . CESAREAN SECTION    . CHOLECYSTECTOMY    . CYSTECTOMY    . PANCREATECTOMY  01/09/2011  . SPLENECTOMY, PARTIAL  01/09/2011     OB History   No obstetric history on file.     Family History  Problem Relation Age of Onset  . Heart disease Father        died at age 73  . Stroke Mother 80       per pt stroke from high doses of iron injections  . Cancer Mother 83       cervical  . Diabetes Mother     Social History   Tobacco Use  . Smoking status: Former Smoker    Quit date: 12/19/1981    Years since quitting: 37.7  . Smokeless tobacco: Never Used  Substance Use Topics  . Alcohol use: Yes    Alcohol/week: 1.0 standard drinks    Types: 1 Standard drinks or equivalent per week  . Drug use: No    Home Medications Prior to Admission medications   Medication Sig Start Date End Date Taking? Authorizing Provider  albuterol (PROVENTIL HFA;VENTOLIN HFA) 108 (90 Base) MCG/ACT inhaler Sig 2 puffs twice a day for 5 days.  07/03/18   Horald Pollen, MD  aspirin EC 81 MG tablet Take 1 tablet (81 mg total) by mouth daily. Patient not taking: Reported on 07/03/2018 05/05/17   Tereasa Coop, PA-C  benzonatate (TESSALON) 200 MG capsule Take 1 capsule (200 mg total) by mouth 2 (two) times daily as needed for cough. 07/03/18   Horald Pollen, MD  gabapentin (NEURONTIN) 100 MG capsule Take 1 capsule (100 mg total) by mouth 3 (three) times daily. Patient not taking: Reported on 07/03/2018 12/01/17   Windell Hummingbird L, PA-C  glucose blood (ONE TOUCH ULTRA TEST) test strip Test blood sugar daily. Patient not taking: Reported on 07/03/2018 12/01/17   Gale Journey, Damaris Hippo, PA-C  Insulin Glargine (LANTUS SOLOSTAR) 100 UNIT/ML Solostar Pen INJECT 10 UNITS  SUBCUTANEOUSLY ONCE DAILY AT  10  PM Patient not taking: Reported on 07/03/2018 12/01/17   Gale Journey, Damaris Hippo, PA-C  Insulin Pen Needle (RELION PEN NEEDLES) 31G X 6 MM MISC USE 1 PEN NEEDLE AS NEEDED Patient not taking: Reported on 07/03/2018 12/01/17   Gale Journey, Damaris Hippo, PA-C  lisinopril-hydrochlorothiazide (PRINZIDE,ZESTORETIC) 20-25 MG tablet TAKE 1 TABLET BY MOUTH ONCE DAILY 08/31/18   Rutherford Guys, MD  metFORMIN (GLUCOPHAGE XR) 500 MG 24 hr tablet Take 4 tablets (2,000 mg total) by mouth daily with breakfast. Office visit needed for 90 day supply Patient not taking: Reported on 07/03/2018 06/10/18   Forrest Moron, MD  metoprolol (TOPROL-XL) 200 MG 24 hr tablet TAKE 1 TABLET BY MOUTH ONCE DAILY WITH  OR  IMMEDIATELY  FOLLOWING  A  MEAL Patient not taking: Reported on 07/03/2018 06/10/18   Forrest Moron, MD  naproxen sodium (ALEVE) 220 MG tablet Take 440 mg by mouth daily as needed (pain).    [provider]  Olopatadine HCl 0.2 % SOLN Apply 1 drop to eye daily as needed. Patient not taking: Reported on 07/03/2018 01/19/18   Tereasa Coop, PA-C  pantoprazole (PROTONIX) 40 MG tablet Take 1 tablet (40 mg total) by mouth daily. Patient not taking: Reported on 07/03/2018 12/01/17   Mancel Bale, PA-C  promethazine-codeine (PHENERGAN WITH CODEINE) 6.25-10 MG/5ML syrup Take 5 mLs by mouth at bedtime as needed for cough. 07/03/18   Horald Pollen, MD  trimethoprim-polymyxin b [facility]) ophthalmic solution Place 1 drop into both eyes every 6 (six) hours. Patient not taking: Reported on 07/03/2018 01/19/18   Tereasa Coop, PA-C  TRULICITY [id number] 0000000 SOPN INJECT 0.75 MG (1 SYRINGE) SUBCUTANEOUSLY ONCE A WEEK 07/13/18   Rutherford Guys, MD    Allergies    Crestor [rosuvastatin calcium], Norvasc [amlodipine], and Pravastatin  Review of Systems   Review of Systems  Constitutional: Positive for fatigue. Negative for activity change, appetite change and fever.  HENT: Positive  for congestion and rhinorrhea.   Respiratory: Positive for cough and shortness of breath.   Cardiovascular: Negative for chest pain.  Gastrointestinal: Negative for abdominal pain, nausea and vomiting.  Genitourinary: Negative for dysuria and hematuria.  Musculoskeletal: Positive for back pain. Negative for arthralgias and myalgias.  Skin: Negative for rash.  Neurological: Negative for facial asymmetry, weakness and headaches.   all other systems are negative except as noted in the HPI and PMH.    Physical Exam Updated Vital Signs BP (!) 150/87   Pulse 69   Temp 98 F (36.7 C) (Oral)   Resp 20   SpO2 98%   Physical Exam Vitals and nursing note reviewed.  Constitutional:  General: She is not in acute distress.    Appearance: She is well-developed. She is obese.     Comments: Frequent dry cough  HENT:     Head: Normocephalic and atraumatic.     Mouth/Throat:     Pharynx: No oropharyngeal exudate.  Eyes:     Conjunctiva/sclera: Conjunctivae normal.     Pupils: Pupils are equal, round, and reactive to light.  Neck:     Comments: No meningismus. Cardiovascular:     Rate and Rhythm: Normal rate and regular rhythm.     Heart sounds: Normal heart sounds. No murmur.  Pulmonary:     Effort: Pulmonary effort is normal. No respiratory distress.     Breath sounds: Normal breath sounds. No wheezing.     Comments: Diminished breath sounds Abdominal:     Palpations: Abdomen is soft.     Tenderness: There is no abdominal tenderness. There is no guarding or rebound.  Musculoskeletal:        General: No tenderness. Normal range of motion.     Cervical back: Normal range of motion and neck supple.  Skin:    General: Skin is warm.     Capillary Refill: Capillary refill takes less than 2 seconds.  Neurological:     General: No focal deficit present.     Mental Status: She is alert and oriented to person, place, and time. Mental status is at baseline.     Cranial Nerves: No cranial  nerve deficit.     Motor: No abnormal muscle tone.     Coordination: Coordination normal.     Comments:  5/5 strength throughout. CN 2-12 intact.Equal grip strength.   Psychiatric:        Behavior: Behavior normal.     ED Results / Procedures / Treatments   Labs (all labs ordered are listed, but only abnormal results are displayed) Labs Reviewed  CBC WITH DIFFERENTIAL/PLATELET - Abnormal; Notable for the following components:      Result Value   WBC 11.9 (*)    MCH 25.3 (*)    RDW 16.5 (*)    Platelets 407 (*)    Lymphs Abs 5.3 (*)    All other components within normal limits  COMPREHENSIVE METABOLIC PANEL - Abnormal; Notable for the following components:   Glucose, Bld 151 (*)    Total Bilirubin 0.2 (*)    All other components within normal limits  RESPIRATORY PANEL BY RT PCR (FLU A&B, COVID)  BRAIN NATRIURETIC PEPTIDE  D-DIMER, QUANTITATIVE (NOT AT [facility])  POC SARS CORONAVIRUS 2 AG -  ED  TROPONIN I (HIGH SENSITIVITY)  TROPONIN I (HIGH SENSITIVITY)    EKG EKG Interpretation  Date/Time:  Sunday September 12 2019 19:08:56 EST Ventricular Rate:  67 PR Interval:    QRS Duration: 94 QT Interval:  397 QTC Calculation: 420 R Axis:   30 Text Interpretation: Sinus rhythm Baseline wander in lead(s) V3 V4 V5 V6 No significant change was found Confirmed by Ezequiel [place] [phone]) on 09/12/2019 7:30:45 PM   Radiology DG Chest 2 View  Result Date: 09/12/2019 CLINICAL DATA:  59 year old female with shortness of breath. EXAM: CHEST - 2 VIEW COMPARISON:  Chest radiograph dated 07/05/2017. FINDINGS: There is no focal consolidation, pleural effusion, pneumothorax. The cardiac silhouette is within normal limits. No acute osseous pathology. IMPRESSION: No active cardiopulmonary disease. Electronically Signed   By: Anner Crete M.D.   On: 09/12/2019 19:40    Procedures Procedures (including critical care time)  Medications Ordered in  ED Medications  albuterol (PROVENTIL) (2.5  MG/3ML) 0.083% nebulizer solution 5 mg (has no administration in time range)    ED Course  I have reviewed the triage vital signs and the nursing notes.  Pertinent labs & imaging results that were available during my care of the patient were reviewed by me and considered in my medical decision making (see chart for details).    MDM Rules/Calculators/A&P                     1 week of shortness of breath and nonproductive cough.  Denies chest pain or fever.  Denies any body aches or chills.  No hypoxia or increased work of breathing.  Diminished breath sounds on exam.  EKG sinus rhythm no acute ischemia.  Chest x-ray is negative.  Troponin negative and D-dimer negative.  Low suspicion for ACS, pulmonary embolism, pneumothorax or pneumonia.  Patient able to ambulate without desaturation.  We will check second troponin as well as Covid screen.  Anticipate discharge home with antitussives and PCP follow-up.  Her cough may also be due to her ACE inhibitor use. Advised to discuss possible discontinuation of this with her doctor.  Discussed that patient may benefit from a stress test.  Return to the ED with worsening symptoms including exertional chest pain, shortness of breath, nausea, vomiting, sweating regular concerns. Care transferred to PA-C [place] at shift change pending second troponin and COVID test.  [name] was evaluated in Emergency Department on 09/12/2019 for the symptoms described in the history of present illness. She was evaluated in the context of the global COVID-19 pandemic, which necessitated consideration that the patient might be at risk for infection with the SARS-CoV-2 virus that causes COVID-19. Institutional protocols and algorithms that pertain to the evaluation of patients at risk for COVID-19 are in a state of rapid change based on information released by regulatory bodies including the CDC and federal and state organizations. These policies and algorithms were  followed during the patient's care in the ED.    Final Clinical Impression(s) / ED Diagnoses Final diagnoses:  Dyspnea, unspecified type    Rx / DC Orders ED Discharge Orders    None       Mackenzie Lia, Annie Main, MD 09/12/19 2314

## 2019-09-12 NOTE — ED Notes (Signed)
Pt ambulated for 2 minutes. Maintained O2 at 97-98%. Dropped to 96% one time.

## 2019-09-12 NOTE — ED Notes (Signed)
MD Rancour have been made aware of PT Neg (-) POC covid test

## 2019-10-05 DIAGNOSIS — E1169 Type 2 diabetes mellitus with other specified complication: Secondary | ICD-10-CM | POA: Diagnosis not present

## 2019-10-05 DIAGNOSIS — I10 Essential (primary) hypertension: Secondary | ICD-10-CM | POA: Diagnosis not present

## 2019-10-05 DIAGNOSIS — E1165 Type 2 diabetes mellitus with hyperglycemia: Secondary | ICD-10-CM | POA: Diagnosis not present

## 2019-10-05 DIAGNOSIS — E1159 Type 2 diabetes mellitus with other circulatory complications: Secondary | ICD-10-CM | POA: Diagnosis not present

## 2019-10-05 DIAGNOSIS — E785 Hyperlipidemia, unspecified: Secondary | ICD-10-CM | POA: Diagnosis not present

## 2019-10-14 ENCOUNTER — Ambulatory Visit (INDEPENDENT_AMBULATORY_CARE_PROVIDER_SITE_OTHER): Payer: BC Managed Care – PPO | Admitting: Cardiology

## 2019-10-14 ENCOUNTER — Other Ambulatory Visit: Payer: Self-pay

## 2019-10-14 ENCOUNTER — Encounter: Payer: Self-pay | Admitting: Cardiology

## 2019-10-14 VITALS — BP 134/82 | HR 66 | Temp 97.2°F | Ht 66.0 in | Wt 261.0 lb

## 2019-10-14 DIAGNOSIS — E785 Hyperlipidemia, unspecified: Secondary | ICD-10-CM

## 2019-10-14 DIAGNOSIS — I10 Essential (primary) hypertension: Secondary | ICD-10-CM

## 2019-10-14 DIAGNOSIS — R0602 Shortness of breath: Secondary | ICD-10-CM | POA: Diagnosis not present

## 2019-10-14 MED ORDER — EZETIMIBE 10 MG PO TABS: 10.0000 mg | ORAL_TABLET | Freq: Every day | ORAL | 3 refills | Status: AC

## 2019-10-14 NOTE — Patient Instructions (Signed)
Medication Instructions:  Start Zetia 10 mg daily   *If you need a refill on your cardiac medications before your next appointment, please call your pharmacy*   Testing/Procedures: Echocardiogram - Your physician has requested that you have an echocardiogram. Echocardiography is a painless test that uses sound waves to create images of your heart. It provides your doctor with information about the size and shape of your heart and how well your heart's chambers and valves are working. This procedure takes approximately one hour. There are no restrictions for this procedure. This will be performed at our [facility] location - [address].  Calcium Score   Follow-Up: At [facility], you and your health needs are our priority.  As part of our continuing mission to provide you with exceptional heart care, we have created designated Provider Care Teams.  These Care Teams include your primary Cardiologist (physician) and Advanced Practice Providers (APPs -  Physician Assistants and Nurse Practitioners) who all work together to provide you with the care you need, when you need it.  We recommend signing up for the patient portal called "MyChart".  Sign up information is provided on this After Visit Summary.  MyChart is used to connect with patients for Virtual Visits (Telemedicine).  Patients are able to view lab/test results, encounter notes, upcoming appointments, etc.  Non-urgent messages can be sent to your provider as well.   To learn more about what you can do with MyChart, go to [email or web address].    Your next appointment:   3 month(s)  The format for your next appointment:   In Person  Provider:   Oswaldo Milian, MD

## 2019-10-14 NOTE — Progress Notes (Signed)
Cardiology Office Note:    Date:  10/14/2019   ID:  [name], DOB 27-Sep-1960, MRN [id number]  PCP:  Patient, No Pcp Per  Cardiologist:  No primary care provider on file.  Electrophysiologist:  None   Referring MD: Mancel Bale, PA-C   Chief Complaint  Patient presents with  . Shortness of Breath    History of Present Illness:    [name] is a 59 y.o. female with a hx of type 2 diabetes, hyperlipidemia, hypertension, morbid obesity who is referred by Windell Hummingbird, PA for evaluation of shortness of breath.  She reported that she began having shortness of breath in February.  Had been started on Bydureon shortly before this.  Also had issues with coughing.  States that would feel short of breath both with exertion and at rest.  Bydureon was discontinued, as well as lisinopril as thought to be causing her cough.  She reports that since making the medication changes her dyspnea is improving though does still have some shortness of breath.  Prior to the winter, she would walk for 30 to 45 minutes each day.  She is planning to starting to walk again now that the weather is improved.  She denies any exertional chest pain.  She has been unable to tolerate statins.  LDL 103 on 10/05/2019.     Past Medical History:  Diagnosis Date  . Abdominal pain    pain around incision area   . Allergy   . Anemia   . Cancer ([place])   . Costochondritis   . Diabetes mellitus   . GERD (gastroesophageal reflux disease)   . Hyperlipidemia   . Hypertension   . Numbness of fingers     Past Surgical History:  Procedure Laterality Date  . ABDOMINAL HYSTERECTOMY     partial  . BREAST BIOPSY     left  . BREAST EXCISIONAL BIOPSY Left   . CESAREAN SECTION    . CHOLECYSTECTOMY    . CYSTECTOMY    . PANCREATECTOMY  01/09/2011  . SPLENECTOMY, PARTIAL  01/09/2011    Current Medications: Current Meds  Medication Sig  . albuterol (VENTOLIN HFA) 108 (90 Base) MCG/ACT inhaler Inhale into the lungs.  Marland Kitchen  aspirin EC 81 MG tablet Take 1 tablet (81 mg total) by mouth daily.  . diclofenac Sodium (VOLTAREN) 1 % GEL Apply topically.  . Dulaglutide (TRULICITY) 1.5 0000000 SOPN INJECT 0.5 ML (1.5MG ) SUBCUTANEOUSLY ONCE A WEEK  . empagliflozin (JARDIANCE) 10 MG TABS tablet Take 1 tablet by mouth once daily  . glucose blood test strip Test blood sugar daily.  . insulin glargine (LANTUS) 100 UNIT/ML Solostar Pen Inject into the skin.  . Insulin Pen Needle 31G X 6 MM MISC USE 1 PEN NEEDLE AS NEEDED  . metFORMIN (GLUCOPHAGE) 1000 MG tablet Take by mouth.  . metoprolol (TOPROL-XL) 200 MG 24 hr tablet Take 1 tablet by mouth once daily  . mometasone (NASONEX) 50 MCG/ACT nasal spray Place into the nose.  . naproxen sodium (ALEVE) 220 MG tablet Take by mouth.  . Olopatadine HCl 0.2 % SOLN Place one drop into both eyes daily.  . Omega-3 Fatty Acids (FISH OIL) 1000 MG CAPS Take by mouth.  . pantoprazole (PROTONIX) 40 MG tablet Take by mouth.  . valsartan-hydrochlorothiazide (DIOVAN-HCT) 80-12.5 MG tablet Take by mouth.     Allergies:   Crestor [rosuvastatin calcium], Norvasc [amlodipine], and Pravastatin   Social History   Socioeconomic History  . Marital status: Married  Spouse name: Not on file  . Number of children: 3  . Years of education: Not on file  . Highest education level: Not on file  Occupational History  . Not on file  Tobacco Use  . Smoking status: Former Smoker    Quit date: 12/19/1981    Years since quitting: 37.8  . Smokeless tobacco: Never Used  Substance and Sexual Activity  . Alcohol use: Yes    Alcohol/week: 1.0 standard drinks    Types: 1 Standard drinks or equivalent per week  . Drug use: No  . Sexual activity: Yes    Partners: Male  Other Topics Concern  . Not on file  Social History Narrative  . Not on file   Social Determinants of Health   Financial Resource Strain:   . Difficulty of Paying Living Expenses:   Food Insecurity:   . Worried About Sales executive in the Last Year:   . Arboriculturist in the Last Year:   Transportation Needs:   . Film/video editor (Medical):   Marland Kitchen Lack of Transportation (Non-Medical):   Physical Activity:   . Days of Exercise per Week:   . Minutes of Exercise per Session:   Stress:   . Feeling of Stress :   Social Connections:   . Frequency of Communication with Friends and Family:   . Frequency of Social Gatherings with Friends and Family:   . Attends Religious Services:   . Active Member of Clubs or Organizations:   . Attends Archivist Meetings:   Marland Kitchen Marital Status:      Family History: The patient's family history includes Cancer (age of onset: 2) in her mother; Diabetes in her mother; Heart disease in her father; Stroke (age of onset: 49) in her mother.  ROS:   Please see the history of present illness.     All other systems reviewed and are negative.  EKGs/Labs/Other Studies Reviewed:    The following studies were reviewed today:   EKG:  EKG is ordered today.  The ekg ordered today demonstrates normal sinus rhythm, rate 66, Q waves in leads III, aVF  Recent Labs: 09/12/2019: ALT 23; B Natriuretic Peptide 52.8; BUN 16; Creatinine, Ser 0.85; Hemoglobin 12.6; Platelets 407; Potassium 3.9; Sodium 138  Recent Lipid Panel    Component Value Date/Time   CHOL 202 (H) 12/01/2017 1728   TRIG 129 12/01/2017 1728   HDL 67 12/01/2017 1728   CHOLHDL 3.0 12/01/2017 1728   CHOLHDL 2.7 03/23/2016 0915   VLDL 14 03/23/2016 0915   LDLCALC 109 (H) 12/01/2017 1728    Physical Exam:    VS:  BP 134/82   Pulse 66   Temp (!) 97.2 F (36.2 C)   Ht 5\' 6"  (1.676 m)   Wt 261 lb (118.4 kg)   BMI 42.13 kg/m     Wt Readings from Last 3 Encounters:  10/14/19 261 lb (118.4 kg)  07/03/18 262 lb (118.8 kg)  01/19/18 265 lb 2.9 oz (120.3 kg)     GEN:  in no acute distress HEENT: Normal NECK: No JVD LYMPHATICS: No lymphadenopathy CARDIAC: RRR, no murmurs, rubs, gallops RESPIRATORY:  Clear  to auscultation without rales, wheezing or rhonchi  ABDOMEN: Soft, non-tender, non-distended MUSCULOSKELETAL:  No edema SKIN: Warm and dry NEUROLOGIC:  Alert and oriented x 3 PSYCHIATRIC:  Normal affect   ASSESSMENT:    1. Shortness of breath   2. Hyperlipidemia, unspecified hyperlipidemia type   3. Essential  hypertension    PLAN:    Dyspnea: Likely multifactorial, as deconditioning likely contributing and reports improvement since stopping Bydureon.  Will check TTE to evaluate for structural heart disease  Hyperlipidemia: Meets indication for statin given diabetes, but has been unable to tolerate.  Will start Zetia 10 mg daily.  Will check calcium score for further risk stratification  Hypertension: On valsartan-hydrochlorothiazide 80-12.5 mg and Toprol-XL 200 mg daily.  Appears controlled  Type 2 diabetes: On insulin  RTC in 6 weeks  Medication Adjustments/Labs and Tests Ordered: Current medicines are reviewed at length with the patient today.  Concerns regarding medicines are outlined above.  Orders Placed This Encounter  Procedures  . CT CARDIAC SCORING  . EKG 12-Lead  . ECHOCARDIOGRAM COMPLETE   Meds ordered this encounter  Medications  . ezetimibe (ZETIA) 10 MG tablet    Sig: Take 1 tablet (10 mg total) by mouth daily.    Dispense:  90 tablet    Refill:  3    Patient Instructions  Medication Instructions:  Start Zetia 10 mg daily   *If you need a refill on your cardiac medications before your next appointment, please call your pharmacy*   Testing/Procedures: Echocardiogram - Your physician has requested that you have an echocardiogram. Echocardiography is a painless test that uses sound waves to create images of your heart. It provides your doctor with information about the size and shape of your heart and how well your heart's chambers and valves are working. This procedure takes approximately one hour. There are no restrictions for this procedure. This will be  performed at our [facility] location - [address].  Calcium Score   Follow-Up: At [facility], you and your health needs are our priority.  As part of our continuing mission to provide you with exceptional heart care, we have created designated Provider Care Teams.  These Care Teams include your primary Cardiologist (physician) and Advanced Practice Providers (APPs -  Physician Assistants and Nurse Practitioners) who all work together to provide you with the care you need, when you need it.  We recommend signing up for the patient portal called "MyChart".  Sign up information is provided on this After Visit Summary.  MyChart is used to connect with patients for Virtual Visits (Telemedicine).  Patients are able to view lab/test results, encounter notes, upcoming appointments, etc.  Non-urgent messages can be sent to your provider as well.   To learn more about what you can do with MyChart, go to [email or web address].    Your next appointment:   3 month(s)  The format for your next appointment:   In Person  Provider:   Oswaldo Milian, MD         Signed, Donato Heinz, MD  10/14/2019 4:07 PM    [place]

## 2019-10-26 ENCOUNTER — Other Ambulatory Visit: Payer: Self-pay

## 2019-10-26 ENCOUNTER — Ambulatory Visit (HOSPITAL_COMMUNITY): Payer: BC Managed Care – PPO | Attending: Internal Medicine

## 2019-10-26 ENCOUNTER — Ambulatory Visit (INDEPENDENT_AMBULATORY_CARE_PROVIDER_SITE_OTHER)
Admission: RE | Admit: 2019-10-26 | Discharge: 2019-10-26 | Disposition: A | Discharge status: Home / Self Care | Payer: Self-pay | Source: Ambulatory Visit | Attending: Cardiology | Admitting: Cardiology

## 2019-10-26 DIAGNOSIS — R0602 Shortness of breath: Secondary | ICD-10-CM

## 2019-10-26 DIAGNOSIS — E785 Hyperlipidemia, unspecified: Secondary | ICD-10-CM

## 2019-11-02 DIAGNOSIS — E1142 Type 2 diabetes mellitus with diabetic polyneuropathy: Secondary | ICD-10-CM | POA: Diagnosis not present

## 2019-11-02 DIAGNOSIS — E1159 Type 2 diabetes mellitus with other circulatory complications: Secondary | ICD-10-CM | POA: Diagnosis not present

## 2019-11-02 DIAGNOSIS — E785 Hyperlipidemia, unspecified: Secondary | ICD-10-CM | POA: Diagnosis not present

## 2019-11-02 DIAGNOSIS — I10 Essential (primary) hypertension: Secondary | ICD-10-CM | POA: Diagnosis not present

## 2019-11-02 DIAGNOSIS — E1169 Type 2 diabetes mellitus with other specified complication: Secondary | ICD-10-CM | POA: Diagnosis not present

## 2019-11-04 DIAGNOSIS — I10 Essential (primary) hypertension: Secondary | ICD-10-CM | POA: Diagnosis not present

## 2019-11-04 DIAGNOSIS — E559 Vitamin D deficiency, unspecified: Secondary | ICD-10-CM | POA: Diagnosis not present

## 2019-11-04 DIAGNOSIS — E1169 Type 2 diabetes mellitus with other specified complication: Secondary | ICD-10-CM | POA: Diagnosis not present

## 2019-11-04 DIAGNOSIS — Z862 Personal history of diseases of the blood and blood-forming organs and certain disorders involving the immune mechanism: Secondary | ICD-10-CM | POA: Diagnosis not present

## 2019-11-04 DIAGNOSIS — E1159 Type 2 diabetes mellitus with other circulatory complications: Secondary | ICD-10-CM | POA: Diagnosis not present

## 2019-11-04 DIAGNOSIS — Z789 Other specified health status: Secondary | ICD-10-CM | POA: Diagnosis not present

## 2019-11-04 DIAGNOSIS — Z6841 Body Mass Index (BMI) 40.0 and over, adult: Secondary | ICD-10-CM | POA: Diagnosis not present

## 2019-11-04 DIAGNOSIS — E785 Hyperlipidemia, unspecified: Secondary | ICD-10-CM | POA: Diagnosis not present

## 2019-11-04 DIAGNOSIS — E1165 Type 2 diabetes mellitus with hyperglycemia: Secondary | ICD-10-CM | POA: Diagnosis not present

## 2019-11-23 DIAGNOSIS — E1169 Type 2 diabetes mellitus with other specified complication: Secondary | ICD-10-CM | POA: Diagnosis not present

## 2019-11-23 DIAGNOSIS — H3500 Unspecified background retinopathy: Secondary | ICD-10-CM | POA: Diagnosis not present

## 2019-11-23 DIAGNOSIS — Z9081 Acquired absence of spleen: Secondary | ICD-10-CM | POA: Diagnosis not present

## 2019-11-24 DIAGNOSIS — E6609 Other obesity due to excess calories: Secondary | ICD-10-CM | POA: Diagnosis not present

## 2019-12-10 DIAGNOSIS — R0683 Snoring: Secondary | ICD-10-CM | POA: Diagnosis not present

## 2019-12-10 DIAGNOSIS — Z6841 Body Mass Index (BMI) 40.0 and over, adult: Secondary | ICD-10-CM | POA: Diagnosis not present

## 2019-12-10 DIAGNOSIS — G4710 Hypersomnia, unspecified: Secondary | ICD-10-CM | POA: Diagnosis not present

## 2019-12-21 ENCOUNTER — Telehealth: Payer: Self-pay | Admitting: Cardiology

## 2019-12-21 ENCOUNTER — Ambulatory Visit
Admission: EM | Admit: 2019-12-21 | Discharge: 2019-12-21 | Disposition: A | Discharge status: Home / Self Care | Payer: BC Managed Care – PPO | Attending: Physician Assistant | Admitting: Physician Assistant

## 2019-12-21 ENCOUNTER — Ambulatory Visit (INDEPENDENT_AMBULATORY_CARE_PROVIDER_SITE_OTHER): Payer: BC Managed Care – PPO

## 2019-12-21 DIAGNOSIS — M25572 Pain in left ankle and joints of left foot: Secondary | ICD-10-CM

## 2019-12-21 DIAGNOSIS — H2513 Age-related nuclear cataract, bilateral: Secondary | ICD-10-CM | POA: Diagnosis not present

## 2019-12-21 DIAGNOSIS — H35033 Hypertensive retinopathy, bilateral: Secondary | ICD-10-CM | POA: Diagnosis not present

## 2019-12-21 DIAGNOSIS — M79672 Pain in left foot: Secondary | ICD-10-CM

## 2019-12-21 DIAGNOSIS — H40053 Ocular hypertension, bilateral: Secondary | ICD-10-CM | POA: Diagnosis not present

## 2019-12-21 DIAGNOSIS — E119 Type 2 diabetes mellitus without complications: Secondary | ICD-10-CM | POA: Diagnosis not present

## 2019-12-21 DIAGNOSIS — S99922A Unspecified injury of left foot, initial encounter: Secondary | ICD-10-CM | POA: Diagnosis not present

## 2019-12-21 MED ORDER — MELOXICAM 7.5 MG PO TABS: 7.5000 mg | ORAL_TABLET | Freq: Every day | ORAL | 0 refills | Status: DC

## 2019-12-21 NOTE — Discharge Instructions (Signed)
Xray negative for fracture or dislocation, does show arthritis to the joints. Start Mobic. Do not take ibuprofen (motrin/advil)/ naproxen (aleve) while on mobic.  Tylenol/Voltaren gel as needed for further pain relief.  Ice compress, elevation, Ace wrap during activity.  This may take a few weeks to completely resolve, but should be feeling better each week.  Follow-up with PCP/orthopedics if symptoms not improving.

## 2019-12-21 NOTE — ED Provider Notes (Signed)
[facility]    CSN: [id number] Arrival date & time: 12/21/19  1150      History   Chief Complaint Chief Complaint  Patient presents with  . Ankle Pain    HPI [name] is a 59 y.o. female.   59 year old female comes in for left ankle pain after injury this morning.  Tripped going downstairs, and inverted ankle.  No pain at rest, pain with range of motion and weightbearing.  Pain mostly to dorsal and lateral aspect of the foot.  No obvious swelling, contusion.     Past Medical History:  Diagnosis Date  . Abdominal pain    pain around incision area   . Allergy   . Anemia   . Cancer ([place])   . Costochondritis   . Diabetes mellitus   . GERD (gastroesophageal reflux disease)   . Hyperlipidemia   . Hypertension   . Numbness of fingers     Patient Active Problem List   Diagnosis Date Noted  . Cough 07/03/2018  . Chest congestion 07/03/2018  . Viral respiratory infection 07/03/2018  . Status post splenectomy 12/01/2017  . Retinopathy 10/10/2015  . Pure hypercholesterolemia 12/21/2014  . HTN (hypertension) 06/13/2012  . Diabetes mellitus type II, uncontrolled ([place]) 11/14/2011  . Diabetic peripheral neuropathy associated with type 2 diabetes mellitus ([place]) 11/14/2011  . Obesity, Class III, BMI 40-49.9 (morbid obesity) ([place]) 11/14/2011  . Menopause 11/14/2011  . Mass on back 10/28/2011  . Pleural effusion 03/20/2011  . Status post cholecystectomy 01/24/2011  . Serous cystadenoma of pancreas 01/24/2011    Past Surgical History:  Procedure Laterality Date  . ABDOMINAL HYSTERECTOMY     partial  . BREAST BIOPSY     left  . BREAST EXCISIONAL BIOPSY Left   . CESAREAN SECTION    . CHOLECYSTECTOMY    . CYSTECTOMY    . PANCREATECTOMY  01/09/2011  . SPLENECTOMY, PARTIAL  01/09/2011    OB History   No obstetric history on file.      Home Medications    Prior to Admission medications   Medication Sig Start Date End Date Taking? Authorizing  Provider  albuterol (VENTOLIN HFA) 108 (90 Base) MCG/ACT inhaler Inhale into the lungs. 10/05/19   [provider]  aspirin EC 81 MG tablet Take 1 tablet (81 mg total) by mouth daily. 05/05/17   Tereasa Coop, PA-C  diclofenac Sodium (VOLTAREN) 1 % GEL Apply topically. 08/20/19   [provider]  Dulaglutide (TRULICITY) 1.5 CV/8.9FY SOPN INJECT 0.5 ML (1.5MG ) SUBCUTANEOUSLY ONCE A WEEK 10/05/19   [provider]  empagliflozin (JARDIANCE) 10 MG TABS tablet Take 1 tablet by mouth once daily 10/05/19   [provider]  ezetimibe (ZETIA) 10 MG tablet Take 1 tablet (10 mg total) by mouth daily. 10/14/19 01/12/20  Donato Heinz, MD  glucose blood test strip Test blood sugar daily. 12/01/17   [provider]  insulin glargine (LANTUS) 100 UNIT/ML Solostar Pen Inject into the skin. 10/05/19   [provider]  Insulin Pen Needle 31G X 6 MM MISC USE 1 PEN NEEDLE AS NEEDED 12/01/17   [provider]  meloxicam (MOBIC) 7.5 MG tablet Take 1 tablet (7.5 mg total) by mouth daily. 12/21/19   Ok Edwards, PA-C  metFORMIN (GLUCOPHAGE) 1000 MG tablet Take by mouth. 10/05/19   [provider]  metoprolol (TOPROL-XL) 200 MG 24 hr tablet Take 1 tablet by mouth once daily 10/05/19   [provider]  mometasone (NASONEX) 50 MCG/ACT nasal spray Place into the nose. 10/05/19 10/04/20  [provider]  naproxen sodium (ALEVE) 220 MG tablet Take by mouth.    [provider]  Olopatadine HCl 0.2 % SOLN Place one drop into both eyes daily. 05/24/19   [provider]  Omega-3 Fatty Acids (FISH OIL) 1000 MG CAPS Take by mouth. 10/26/18   [provider]  pantoprazole (PROTONIX) 40 MG tablet Take by mouth. 04/13/19   [provider]  valsartan-hydrochlorothiazide (DIOVAN-HCT) 80-12.5 MG tablet Take by mouth. 10/05/19   [provider]    Family History Family History  Problem Relation Age of Onset  .  Heart disease Father        died at age 9  . Stroke Mother 54       per pt stroke from high doses of iron injections  . Cancer Mother 99       cervical  . Diabetes Mother     Social History Social History   Tobacco Use  . Smoking status: Former Smoker    Quit date: 12/19/1981    Years since quitting: 38.0  . Smokeless tobacco: Never Used  Substance Use Topics  . Alcohol use: Yes    Alcohol/week: 1.0 standard drinks    Types: 1 Standard drinks or equivalent per week  . Drug use: No     Allergies   Crestor [rosuvastatin calcium], Norvasc [amlodipine], and Pravastatin   Review of Systems Review of Systems  Reason unable to perform ROS: See HPI as above.     Physical Exam Triage Vital Signs ED Triage Vitals  Enc Vitals Group     BP 12/21/19 1152 132/80     Pulse Rate 12/21/19 1152 70     Resp 12/21/19 1152 18     Temp 12/21/19 1152 98 F (36.7 C)     Temp Source 12/21/19 1152 Oral     SpO2 12/21/19 1152 95 %     Weight --      Height --      Head Circumference --      Peak Flow --      Pain Score 12/21/19 1156 5     Pain Loc --      Pain Edu? --      Excl. in [place]? --    No data found.  Updated Vital Signs BP 132/80 (BP Location: Left Arm)   Pulse 70   Temp 98 F (36.7 C) (Oral)   Resp 18   SpO2 95%   Physical Exam Constitutional:      General: She is not in acute distress.    Appearance: Normal appearance. She is well-developed. She is not toxic-appearing or diaphoretic.  HENT:     Head: Normocephalic and atraumatic.  Eyes:     Conjunctiva/sclera: Conjunctivae normal.     Pupils: Pupils are equal, round, and reactive to light.  Pulmonary:     Effort: Pulmonary effort is normal. No respiratory distress.     Comments: Speaking in full sentences without difficulty Musculoskeletal:     Cervical back: Normal range of motion and neck supple.     Comments: Mild swelling/contusion to the lateral foot. No tenderness to palpation of the ankle. Tenderness  to palpation of proximal 4th/5th MTP, along contusion. Full ROM of ankle, toes. Sensation intact. Pedal pulse 2+  Skin:    General: Skin is warm and dry.  Neurological:     Mental Status: She is alert  and oriented to person, place, and time.      UC Treatments / Results  Labs (all labs ordered are listed, but only abnormal results are displayed) Labs Reviewed - No data to display  EKG   Radiology DG Foot Complete Left  Result Date: 12/21/2019 CLINICAL DATA:  Inversion injury this morning with ankle and foot pain, initial encounter EXAM: LEFT FOOT - COMPLETE 3+ VIEW COMPARISON:  02/05/2008 FINDINGS: No acute fracture or dislocation is noted. Changes of prior trauma in the proximal aspect of the third and fourth metatarsals and possibly fifth metatarsal with healing is seen. This is chronic and stable from the previous exam. Mild tarsal degenerative changes and calcaneal spurring is noted. No acute abnormality is noted. IMPRESSION: Chronic change without acute abnormality. Electronically Signed   By: Inez Catalina M.D.   On: 12/21/2019 12:41    Procedures Procedures (including critical care time)  Medications Ordered in UC Medications - No data to display  Initial Impression / Assessment and Plan / UC Course  I have reviewed the triage vital signs and the nursing notes.  Pertinent labs & imaging results that were available during my care of the patient were reviewed by me and considered in my medical decision making (see chart for details).    Xray negative for fracture or dislocation, though with some arthritic changes. NSAIDs, ice compress, rest, ace wrap during activity. Discussed expected course of healing. Return precautions given.  Final Clinical Impressions(s) / UC Diagnoses   Final diagnoses:  Acute left ankle pain    ED Prescriptions    Medication Sig Dispense Auth. Provider   meloxicam (MOBIC) 7.5 MG tablet Take 1 tablet (7.5 mg total) by mouth daily. 15 tablet Ok Edwards, PA-C     PDMP not reviewed this encounter.   Ok Edwards, PA-C 12/21/19 1316

## 2019-12-21 NOTE — ED Triage Notes (Signed)
Pt c/o tripped going down her steps to her sunroom around 730 this am. States she fell on lt side and turned lt ankle. States it was 3 steps. C/o lt ankle pain. No swelling or deformity noted.

## 2019-12-21 NOTE — Telephone Encounter (Signed)
LMOM to call and schedule f/u appt

## 2019-12-24 ENCOUNTER — Telehealth: Payer: Self-pay | Admitting: Cardiology

## 2019-12-24 NOTE — Telephone Encounter (Signed)
I attempted to call patient on 12/24/19 to schedule follow up visit from patients recall list. The patient didn't answer so I left voicemail for patient to return call to get that appointment scheduled.

## 2020-01-03 DIAGNOSIS — E1169 Type 2 diabetes mellitus with other specified complication: Secondary | ICD-10-CM | POA: Diagnosis not present

## 2020-01-03 DIAGNOSIS — E1159 Type 2 diabetes mellitus with other circulatory complications: Secondary | ICD-10-CM | POA: Diagnosis not present

## 2020-01-03 DIAGNOSIS — N644 Mastodynia: Secondary | ICD-10-CM | POA: Diagnosis not present

## 2020-01-03 DIAGNOSIS — F54 Psychological and behavioral factors associated with disorders or diseases classified elsewhere: Secondary | ICD-10-CM | POA: Diagnosis not present

## 2020-01-03 DIAGNOSIS — Z6841 Body Mass Index (BMI) 40.0 and over, adult: Secondary | ICD-10-CM | POA: Diagnosis not present

## 2020-01-03 DIAGNOSIS — E1142 Type 2 diabetes mellitus with diabetic polyneuropathy: Secondary | ICD-10-CM | POA: Diagnosis not present

## 2020-01-03 DIAGNOSIS — E785 Hyperlipidemia, unspecified: Secondary | ICD-10-CM | POA: Diagnosis not present

## 2020-01-03 DIAGNOSIS — F39 Unspecified mood [affective] disorder: Secondary | ICD-10-CM | POA: Diagnosis not present

## 2020-01-10 DIAGNOSIS — R12 Heartburn: Secondary | ICD-10-CM | POA: Diagnosis not present

## 2020-01-10 DIAGNOSIS — Z23 Encounter for immunization: Secondary | ICD-10-CM | POA: Diagnosis not present

## 2020-01-10 DIAGNOSIS — E1165 Type 2 diabetes mellitus with hyperglycemia: Secondary | ICD-10-CM | POA: Diagnosis not present

## 2020-01-10 DIAGNOSIS — Z0000 Encounter for general adult medical examination without abnormal findings: Secondary | ICD-10-CM | POA: Diagnosis not present

## 2020-01-10 DIAGNOSIS — E1159 Type 2 diabetes mellitus with other circulatory complications: Secondary | ICD-10-CM | POA: Diagnosis not present

## 2020-01-10 DIAGNOSIS — I152 Hypertension secondary to endocrine disorders: Secondary | ICD-10-CM | POA: Diagnosis not present

## 2020-01-10 DIAGNOSIS — Z9081 Acquired absence of spleen: Secondary | ICD-10-CM | POA: Diagnosis not present

## 2020-01-10 DIAGNOSIS — Z789 Other specified health status: Secondary | ICD-10-CM | POA: Diagnosis not present

## 2020-01-10 DIAGNOSIS — Z713 Dietary counseling and surveillance: Secondary | ICD-10-CM | POA: Diagnosis not present

## 2020-01-12 DIAGNOSIS — E1159 Type 2 diabetes mellitus with other circulatory complications: Secondary | ICD-10-CM | POA: Diagnosis not present

## 2020-01-12 DIAGNOSIS — I152 Hypertension secondary to endocrine disorders: Secondary | ICD-10-CM | POA: Diagnosis not present

## 2020-01-12 DIAGNOSIS — Z6841 Body Mass Index (BMI) 40.0 and over, adult: Secondary | ICD-10-CM | POA: Diagnosis not present

## 2020-01-16 NOTE — Progress Notes (Signed)
Cardiology Office Note:    Date:  01/18/2020   ID:  [name], DOB Aug 04, 1960, MRN [id number]  PCP:  Mancel Bale, PA-C  Cardiologist:  No primary care provider on file.  Electrophysiologist:  None   Referring MD: Mancel Bale, PA-C   Chief Complaint  Patient presents with  . Shortness of Breath    History of Present Illness:    [name] is a 59 y.o. female with a hx of type 2 diabetes, hyperlipidemia, hypertension, morbid obesity who presents for follow-up.  She was referred by Diana Hummingbird, PA for evaluation of shortness of breath, initially seen on 10/14/2019.  She reported that she began having shortness of breath in February 2021.  Had been started on Bydureon shortly before this.  Also had issues with coughing.  States that would feel short of breath both with exertion and at rest.  Bydureon was discontinued, as well as lisinopril as thought to be causing her cough.  She reports that since making the medication changes her dyspnea is improving though does still have some shortness of breath.  Prior to the winter, she would walk for 30 to 45 minutes each day.  She is planning to starting to walk again now that the weather is improved.  She denies any exertional chest pain.  She has been unable to tolerate statins.  LDL 103 on 10/05/2019.  Echocardiogram on 10/26/2019 showed normal biventricular function, no significant valvular disease.  Calcium score on 10/26/2019 was 0.  Since last clinic visit, she reports she has been doing well.  States that her dyspnea has resolved, thinks it was likely related to Bydureon.  She has been walking 3 days/week for 20 to 30 minutes.  She denies any chest pain, lightheadedness, palpitations, or syncope.    Past Medical History:  Diagnosis Date  . Abdominal pain    pain around incision area   . Allergy   . Anemia   . Cancer ([place])   . Costochondritis   . Diabetes mellitus   . GERD (gastroesophageal reflux disease)   . Hyperlipidemia   .  Hypertension   . Numbness of fingers     Past Surgical History:  Procedure Laterality Date  . ABDOMINAL HYSTERECTOMY     partial  . BREAST BIOPSY     left  . BREAST EXCISIONAL BIOPSY Left   . CESAREAN SECTION    . CHOLECYSTECTOMY    . CYSTECTOMY    . PANCREATECTOMY  01/09/2011  . SPLENECTOMY, PARTIAL  01/09/2011    Current Medications: Current Meds  Medication Sig  . albuterol (VENTOLIN HFA) 108 (90 Base) MCG/ACT inhaler Inhale into the lungs.  Marland Kitchen aspirin EC 81 MG tablet Take 1 tablet (81 mg total) by mouth daily.  . diclofenac Sodium (VOLTAREN) 1 % GEL Apply topically.  . Dulaglutide (TRULICITY) 1.5 GH/8.2XH SOPN INJECT 0.5 ML (1.5MG ) SUBCUTANEOUSLY ONCE A WEEK  . empagliflozin (JARDIANCE) 10 MG TABS tablet Take 1 tablet by mouth once daily  . glucose blood test strip Test blood sugar daily.  . insulin glargine (LANTUS) 100 UNIT/ML Solostar Pen Inject into the skin.  . Insulin Pen Needle 31G X 6 MM MISC USE 1 PEN NEEDLE AS NEEDED  . meloxicam (MOBIC) 7.5 MG tablet Take 1 tablet (7.5 mg total) by mouth daily.  . metFORMIN (GLUCOPHAGE) 1000 MG tablet Take by mouth.  . metoprolol (TOPROL-XL) 200 MG 24 hr tablet Take 1 tablet by mouth once daily  . mometasone (NASONEX) 50  MCG/ACT nasal spray Place into the nose.  . naproxen sodium (ALEVE) 220 MG tablet Take by mouth.  . Olopatadine HCl 0.2 % SOLN Place one drop into both eyes daily.  . pantoprazole (PROTONIX) 40 MG tablet Take by mouth.  . valsartan-hydrochlorothiazide (DIOVAN-HCT) 80-12.5 MG tablet Take by mouth.     Allergies:   Crestor [rosuvastatin calcium], Norvasc [amlodipine], and Pravastatin   Social History   Socioeconomic History  . Marital status: Married    Spouse name: Not on file  . Number of children: 3  . Years of education: Not on file  . Highest education level: Not on file  Occupational History  . Not on file  Tobacco Use  . Smoking status: Former Smoker    Quit date: 12/19/1981    Years since  quitting: 38.1  . Smokeless tobacco: Never Used  Vaping Use  . Vaping Use: Never used  Substance and Sexual Activity  . Alcohol use: Yes    Alcohol/week: 1.0 standard drink    Types: 1 Standard drinks or equivalent per week  . Drug use: No  . Sexual activity: Yes    Partners: Male  Other Topics Concern  . Not on file  Social History Narrative  . Not on file   Social Determinants of Health   Financial Resource Strain:   . Difficulty of Paying Living Expenses:   Food Insecurity:   . Worried About Charity fundraiser in the Last Year:   . Arboriculturist in the Last Year:   Transportation Needs:   . Film/video editor (Medical):   Marland Kitchen Lack of Transportation (Non-Medical):   Physical Activity:   . Days of Exercise per Week:   . Minutes of Exercise per Session:   Stress:   . Feeling of Stress :   Social Connections:   . Frequency of Communication with Friends and Family:   . Frequency of Social Gatherings with Friends and Family:   . Attends Religious Services:   . Active Member of Clubs or Organizations:   . Attends Archivist Meetings:   Marland Kitchen Marital Status:      Family History: The patient's family history includes Cancer (age of onset: 2) in her mother; Diabetes in her mother; Heart disease in her father; Stroke (age of onset: 4) in her mother.  ROS:   Please see the history of present illness.     All other systems reviewed and are negative.  EKGs/Labs/Other Studies Reviewed:    The following studies were reviewed today:   EKG:  EKG is not ordered today.  The ekg ordered most recently demonstrates normal sinus rhythm, rate 66, Q waves in leads III, aVF  Recent Labs: 09/12/2019: ALT 23; B Natriuretic Peptide 52.8; BUN 16; Creatinine, Ser 0.85; Hemoglobin 12.6; Platelets 407; Potassium 3.9; Sodium 138  Recent Lipid Panel    Component Value Date/Time   CHOL 202 (H) 12/01/2017 1728   TRIG 129 12/01/2017 1728   HDL 67 12/01/2017 1728   CHOLHDL 3.0  12/01/2017 1728   CHOLHDL 2.7 03/23/2016 0915   VLDL 14 03/23/2016 0915   LDLCALC 109 (H) 12/01/2017 1728    Physical Exam:    VS:  BP 140/78   Pulse 68   Temp (!) 96.9 F (36.1 C)   Ht 5\' 6"  (1.676 m)   Wt 264 lb (119.7 kg)   SpO2 96%   BMI 42.61 kg/m     Wt Readings from Last 3 Encounters:  01/18/20  264 lb (119.7 kg)  10/14/19 261 lb (118.4 kg)  07/03/18 262 lb (118.8 kg)     GEN:  in no acute distress HEENT: Normal NECK: No JVD CARDIAC: RRR, no murmurs, rubs, gallops RESPIRATORY:  Clear to auscultation without rales, wheezing or rhonchi  ABDOMEN: Soft, non-tender, non-distended MUSCULOSKELETAL:  No edema SKIN: Warm and dry NEUROLOGIC:  Alert and oriented x 3 PSYCHIATRIC:  Normal affect   ASSESSMENT:    1. Shortness of breath   2. Hyperlipidemia, unspecified hyperlipidemia type   3. Essential hypertension    PLAN:    Dyspnea: suspect multifactorial, as deconditioning likely contributing and reports improvement since stopping Bydureon.  Echocardiogram shows no structural heart disease.  Hyperlipidemia: Meets indication for statin given diabetes, but has been unable to tolerate.  Started Zetia 10 mg daily.  Calcium score 0 on 10/26/2019.  LDL 95 on 01/03/2020.  Hypertension: On valsartan-hydrochlorothiazide 80-12.5 mg and Toprol-XL 200 mg daily.  Mildly elevated in clinic today, reports she has an appointment with her PCP in a few weeks.  Recommended checking BP daily over the next 2 weeks and bring log to her PCP appointment.  Also recommended bringing her home BP monitor to verify accuracy  Type 2 diabetes: On insulin.  A1c 7.1 on 01/03/2020.  Suspected OSA: has sleep study scheduled this week  RTC in 1 year  Medication Adjustments/Labs and Tests Ordered: Current medicines are reviewed at length with the patient today.  Concerns regarding medicines are outlined above.  No orders of the defined types were placed in this encounter.  No orders of the defined  types were placed in this encounter.   Patient Instructions  Medication Instructions:  Your physician recommends that you continue on your current medications as directed. Please refer to the Current Medication list given to you today.  Follow-Up: At [facility], you and your health needs are our priority.  As part of our continuing mission to provide you with exceptional heart care, we have created designated Provider Care Teams.  These Care Teams include your primary Cardiologist (physician) and Advanced Practice Providers (APPs -  Physician Assistants and Nurse Practitioners) who all work together to provide you with the care you need, when you need it.  We recommend signing up for the patient portal called "MyChart".  Sign up information is provided on this After Visit Summary.  MyChart is used to connect with patients for Virtual Visits (Telemedicine).  Patients are able to view lab/test results, encounter notes, upcoming appointments, etc.  Non-urgent messages can be sent to your provider as well.   To learn more about what you can do with MyChart, go to [email or web address].    Your next appointment:   12 month(s)  The format for your next appointment:   In Person  Provider:   Oswaldo Milian, MD        Signed, Donato Heinz, MD  01/18/2020 12:16 PM    [place]

## 2020-01-18 ENCOUNTER — Ambulatory Visit (INDEPENDENT_AMBULATORY_CARE_PROVIDER_SITE_OTHER): Payer: BC Managed Care – PPO | Admitting: Cardiology

## 2020-01-18 ENCOUNTER — Other Ambulatory Visit: Payer: Self-pay

## 2020-01-18 ENCOUNTER — Encounter: Payer: Self-pay | Admitting: Cardiology

## 2020-01-18 VITALS — BP 140/78 | HR 68 | Temp 96.9°F | Ht 66.0 in | Wt 264.0 lb

## 2020-01-18 DIAGNOSIS — I10 Essential (primary) hypertension: Secondary | ICD-10-CM | POA: Diagnosis not present

## 2020-01-18 DIAGNOSIS — R0602 Shortness of breath: Secondary | ICD-10-CM | POA: Diagnosis not present

## 2020-01-18 DIAGNOSIS — E785 Hyperlipidemia, unspecified: Secondary | ICD-10-CM

## 2020-01-18 NOTE — Patient Instructions (Signed)

## 2020-01-19 DIAGNOSIS — G4710 Hypersomnia, unspecified: Secondary | ICD-10-CM | POA: Diagnosis not present

## 2020-01-19 DIAGNOSIS — R0683 Snoring: Secondary | ICD-10-CM | POA: Diagnosis not present

## 2020-02-09 ENCOUNTER — Other Ambulatory Visit: Payer: Self-pay

## 2020-02-09 ENCOUNTER — Emergency Department (HOSPITAL_COMMUNITY)
Admission: EM | Admit: 2020-02-09 | Discharge: 2020-02-09 | Disposition: A | Discharge status: Home / Self Care | Payer: BC Managed Care – PPO | Attending: Emergency Medicine | Admitting: Emergency Medicine

## 2020-02-09 ENCOUNTER — Encounter (HOSPITAL_COMMUNITY): Payer: Self-pay

## 2020-02-09 ENCOUNTER — Emergency Department (HOSPITAL_BASED_OUTPATIENT_CLINIC_OR_DEPARTMENT_OTHER): Payer: BC Managed Care – PPO

## 2020-02-09 ENCOUNTER — Ambulatory Visit
Admission: EM | Admit: 2020-02-09 | Discharge: 2020-02-09 | Discharge status: Absent without leave | Payer: BC Managed Care – PPO

## 2020-02-09 DIAGNOSIS — Z87891 Personal history of nicotine dependence: Secondary | ICD-10-CM | POA: Diagnosis not present

## 2020-02-09 DIAGNOSIS — M79604 Pain in right leg: Secondary | ICD-10-CM | POA: Diagnosis not present

## 2020-02-09 DIAGNOSIS — M79661 Pain in right lower leg: Secondary | ICD-10-CM | POA: Insufficient documentation

## 2020-02-09 DIAGNOSIS — I10 Essential (primary) hypertension: Secondary | ICD-10-CM | POA: Diagnosis not present

## 2020-02-09 DIAGNOSIS — M7989 Other specified soft tissue disorders: Secondary | ICD-10-CM | POA: Diagnosis not present

## 2020-02-09 DIAGNOSIS — Z79899 Other long term (current) drug therapy: Secondary | ICD-10-CM | POA: Insufficient documentation

## 2020-02-09 DIAGNOSIS — C801 Malignant (primary) neoplasm, unspecified: Secondary | ICD-10-CM | POA: Diagnosis not present

## 2020-02-09 DIAGNOSIS — E1140 Type 2 diabetes mellitus with diabetic neuropathy, unspecified: Secondary | ICD-10-CM | POA: Insufficient documentation

## 2020-02-09 DIAGNOSIS — M79662 Pain in left lower leg: Secondary | ICD-10-CM | POA: Diagnosis not present

## 2020-02-09 NOTE — Progress Notes (Signed)
Left lower extremity venous duplex has been completed. Preliminary results can be found in CV Proc through chart review.  Results were given to Dr. Maryan Rued.  02/09/20 11:58 AM Carlos Levering RVT

## 2020-02-09 NOTE — ED Notes (Signed)
Pt discharged from this ED in stable condition at this time. All discharge instructions and follow up care reviewed with pt with no further questions at this time. Pt ambulatory with steady gait, clear speech.  

## 2020-02-09 NOTE — Discharge Instructions (Addendum)
You were evaluated today for a possible left lower leg blood clot. The ultrasound of your leg was negative for blood clot. Please monitor your leg for symptoms of increased swelling, pain, and/or extension of the redness. If you experience any of these symptoms and/or develop chest pain, shortness of breath, etc., please return to the emergency department for reevaluation. Call your primary care physician in the next business day to let them know you were seen in the emergency department, and they can help you decide when you should be seen next.

## 2020-02-09 NOTE — ED Provider Notes (Signed)
[facility] DEPT Provider Note   CSN: [id number] Arrival date & time: 02/09/20  0859     History Chief Complaint  Patient presents with  . Leg Pain    [name] is a 59 y.o. woman with history of T2DM, HTN, and HLD who presents with left anterior lower leg pain.  The patient reports she went to the gym this morning and walked on the treadmill for about 30 minutes, and afterwards noticed some redness and swelling on her left shin. She felt the area and noticed it was tender and "felt funny." After googling her symptoms, she became concerned about a possible blood clot so presented to the [facility], where she was recommended to come to the ED for possible lower extremity imaging.  On my evaluation, the patient states her leg does not feel painful unless she touches it. She denies numbness or tingling but states the leg "just feels funny." She states she felt short of breath while exercising and a few minutes after, however does not currently feel short of breath. She denies chest pain, headache, dizziness, blurry vision, abdominal pain, urinary complaints, weakness. She does not recall being bitten by a bug. She denies any personal or family history of blood clots. States she works from home and is mostly sedentary except for occasional exercise. She denies recent surgery or hospitalizations. She has a personal history of unspecified skin cancer by her R eye which was excised over 20 years ago and family history of mother with cervical cancer. She traveled by car to the [place] a couple of weeks ago and a few weeks before that traveled to [facility] by plane. She denies history of trauma to the area. She reports having had a misstep on some stairs in early June during which she rolled her L ankle and went to [facility] [address] for evaluation. Xray of the L ankle at that time showed some arthritic changes however no fracture. She did RICE and took NSAIDs with good result and has  had normal function for the last few weeks. She endorses "head stuffiness" which has been ongoing for the last few weeks.     Past Medical History:  Diagnosis Date  . Abdominal pain    pain around incision area   . Allergy   . Anemia   . Cancer ([place])   . Costochondritis   . Diabetes mellitus   . GERD (gastroesophageal reflux disease)   . Hyperlipidemia   . Hypertension   . Numbness of fingers     Patient Active Problem List   Diagnosis Date Noted  . Cough 07/03/2018  . Chest congestion 07/03/2018  . Viral respiratory infection 07/03/2018  . Status post splenectomy 12/01/2017  . Retinopathy 10/10/2015  . Pure hypercholesterolemia 12/21/2014  . HTN (hypertension) 06/13/2012  . Diabetes mellitus type II, uncontrolled ([place]) 11/14/2011  . Diabetic peripheral neuropathy associated with type 2 diabetes mellitus ([place]) 11/14/2011  . Obesity, Class III, BMI 40-49.9 (morbid obesity) ([place]) 11/14/2011  . Menopause 11/14/2011  . Mass on back 10/28/2011  . Pleural effusion 03/20/2011  . Status post cholecystectomy 01/24/2011  . Serous cystadenoma of pancreas 01/24/2011    Past Surgical History:  Procedure Laterality Date  . ABDOMINAL HYSTERECTOMY     partial  . BREAST BIOPSY     left  . BREAST EXCISIONAL BIOPSY Left   . CESAREAN SECTION    . CHOLECYSTECTOMY    . CYSTECTOMY    . PANCREATECTOMY  01/09/2011  .  SPLENECTOMY, PARTIAL  01/09/2011     OB History   No obstetric history on file.     Family History  Problem Relation Age of Onset  . Heart disease Father        died at age 80  . Stroke Mother 77       per pt stroke from high doses of iron injections  . Cancer Mother 24       cervical  . Diabetes Mother     Social History   Tobacco Use  . Smoking status: Former Smoker    Quit date: 12/19/1981    Years since quitting: 38.1  . Smokeless tobacco: Never Used  Vaping Use  . Vaping Use: Never used  Substance Use Topics  . Alcohol use: Yes    Alcohol/week: 1.0  standard drink    Types: 1 Standard drinks or equivalent per week  . Drug use: No    Home Medications Prior to Admission medications   Medication Sig Start Date End Date Taking? Authorizing Provider  albuterol (VENTOLIN HFA) 108 (90 Base) MCG/ACT inhaler Inhale into the lungs. 10/05/19   [provider]  aspirin EC 81 MG tablet Take 1 tablet (81 mg total) by mouth daily. 05/05/17   Tereasa Coop, PA-C  diclofenac Sodium (VOLTAREN) 1 % GEL Apply topically. 08/20/19   [provider]  Dulaglutide (TRULICITY) 1.5 ZO/1.0RU SOPN INJECT 0.5 ML (1.5MG ) SUBCUTANEOUSLY ONCE A WEEK 10/05/19   [provider]  empagliflozin (JARDIANCE) 10 MG TABS tablet Take 1 tablet by mouth once daily 10/05/19   [provider]  ezetimibe (ZETIA) 10 MG tablet Take 1 tablet (10 mg total) by mouth daily. 10/14/19 01/12/20  Donato Heinz, MD  glucose blood test strip Test blood sugar daily. 12/01/17   [provider]  insulin glargine (LANTUS) 100 UNIT/ML Solostar Pen Inject into the skin. 10/05/19   [provider]  Insulin Pen Needle 31G X 6 MM MISC USE 1 PEN NEEDLE AS NEEDED 12/01/17   [provider]  meloxicam (MOBIC) 7.5 MG tablet Take 1 tablet (7.5 mg total) by mouth daily. 12/21/19   Ok Edwards, PA-C  metFORMIN (GLUCOPHAGE) 1000 MG tablet Take by mouth. 10/05/19   [provider]  metoprolol (TOPROL-XL) 200 MG 24 hr tablet Take 1 tablet by mouth once daily 10/05/19   [provider]  mometasone (NASONEX) 50 MCG/ACT nasal spray Place into the nose. 10/05/19 10/04/20  [provider]  naproxen sodium (ALEVE) 220 MG tablet Take by mouth.    [provider]  Olopatadine HCl 0.2 % SOLN Place one drop into both eyes daily. 05/24/19   [provider]  pantoprazole (PROTONIX) 40 MG tablet Take by mouth. 04/13/19   [provider]  valsartan-hydrochlorothiazide (DIOVAN-HCT) 80-12.5 MG tablet Take by mouth. 10/05/19    [provider]    Allergies    Crestor [rosuvastatin calcium], Norvasc [amlodipine], and Pravastatin  Review of Systems   Review of Systems  All other systems reviewed and are negative.  All systems reviewed and are negative for acute changes, except as noted in the HPI.  Physical Exam Updated Vital Signs BP (!) 164/72 (BP Location: Right Arm)   Pulse 72   Temp 98.4 F (36.9 C) (Oral)   Resp 20   SpO2 97%   Physical Exam Constitutional:      General: She is not in acute distress.    Appearance: She is obese. She is not ill-appearing or  diaphoretic.  HENT:     Head: Normocephalic and atraumatic.     Mouth/Throat:     Mouth: Mucous membranes are moist.  Eyes:     Extraocular Movements: Extraocular movements intact.  Cardiovascular:     Rate and Rhythm: Normal rate and regular rhythm.     Pulses: Normal pulses.     Heart sounds: Normal heart sounds.  Pulmonary:     Effort: Pulmonary effort is normal.     Breath sounds: Normal breath sounds.  Abdominal:     Palpations: Abdomen is soft.     Tenderness: There is no abdominal tenderness.  Skin:    General: Skin is warm and dry.     Comments: Firmness, erythema, and tenderness to palpation along the anterior L shin. No LLE edema.   Neurological:     Mental Status: She is alert.     Comments: Pulses, strength, and sensation are intact.      ED Results / Procedures / Treatments   Labs (all labs ordered are listed, but only abnormal results are displayed) Labs Reviewed - No data to display  EKG None  Radiology VAS [place] LOWER EXTREMITY VENOUS (DVT) ([facility] and [facility] 7a-7p)  Result Date: 02/09/2020  Lower Venous DVTStudy Indications: Swelling.  Risk Factors: None identified. Limitations: Body habitus and poor ultrasound/tissue interface. Comparison Study: No prior studies. Performing Technologist: Oliver Hum RVT  Examination Guidelines: A complete evaluation includes B-mode imaging, spectral Doppler, color  Doppler, and power Doppler as needed of all accessible portions of each vessel. Bilateral testing is considered an integral part of a complete examination. Limited examinations for reoccurring indications may be performed as noted. The reflux portion of the exam is performed with the patient in reverse Trendelenburg.  +-----+---------------+---------+-----------+----------+--------------+ RIGHTCompressibilityPhasicitySpontaneityPropertiesThrombus Aging +-----+---------------+---------+-----------+----------+--------------+ CFV  Full           Yes      Yes                                 +-----+---------------+---------+-----------+----------+--------------+   +---------+---------------+---------+-----------+----------+--------------+ LEFT     CompressibilityPhasicitySpontaneityPropertiesThrombus Aging +---------+---------------+---------+-----------+----------+--------------+ CFV      Full           Yes      Yes                                 +---------+---------------+---------+-----------+----------+--------------+ SFJ      Full                                                        +---------+---------------+---------+-----------+----------+--------------+ FV Prox  Full                                                        +---------+---------------+---------+-----------+----------+--------------+ FV Mid   Full                                                        +---------+---------------+---------+-----------+----------+--------------+  FV DistalFull                                                        +---------+---------------+---------+-----------+----------+--------------+ PFV      Full                                                        +---------+---------------+---------+-----------+----------+--------------+ POP      Full           Yes      Yes                                  +---------+---------------+---------+-----------+----------+--------------+ PTV      Full                                                        +---------+---------------+---------+-----------+----------+--------------+ PERO     Full                                                        +---------+---------------+---------+-----------+----------+--------------+     Summary: RIGHT: - No evidence of common femoral vein obstruction.  LEFT: - There is no evidence of deep vein thrombosis in the lower extremity. However, portions of this examination were limited- see technologist comments above.  - No cystic structure found in the popliteal fossa.  *See table(s) above for measurements and observations.    Preliminary      Medications Ordered in ED Medications - No data to display  ED Course  I have reviewed the triage vital signs and the nursing notes.  Pertinent labs & imaging results that were available during my care of the patient were reviewed by me and considered in my medical decision making (see chart for details).    MDM Rules/Calculators/A&P                          SHERITTA DEEG is a 59 y.o. woman with history of T2DM, HTN, and HLD who presents with left anterior lower leg pain.  The patient is well-appearing and slightly hypertensive but otherwise with stable vitals. There is firmness, erythema, and tenderness to palpation along the anterior of the patient's L shin. No edema. Pulses, strength, and sensation are intact. She denies SOB. She endorses a "funny feeling" in the LLE. The differential includes DVT, thrombophlebitis, cellulitis, contusion. Given the patient's relatively sedentary lifestyle and recent history of car travel, will obtain LLE ultrasound to evaluate for DVT. Low suspicion for PE as patient denies SOB, chest pain, and is not tachycardic.   - LLE ultrasound negative for DVT and thrombophlebitis. Patient counseled on return precautions and voiced  understanding. Discharged home.   Final Clinical Impression(s) / ED Diagnoses Final diagnoses:  Right leg pain    Rx / DC Orders ED Discharge Orders    None       Alexandria Lodge, MD 02/09/20 1536    Blanchie Dessert, MD 02/11/20 2112

## 2020-02-09 NOTE — ED Triage Notes (Signed)
Patient reports walking on treadmill recently and noticed swelling in left lower leg. Patient has 2 knots on shin and swelling in ankle. Warm to touch. Pulses intact.   Patient went to UC and told it would be best to come to ED for possible [place].   Denies previous hx of blood clots  Patient reports falling on June 8th and twisting ankle and went to UC. Pt can't remember witch ankle.    A/Ox4 Ambulatory in triage.

## 2020-02-16 DIAGNOSIS — I152 Hypertension secondary to endocrine disorders: Secondary | ICD-10-CM | POA: Diagnosis not present

## 2020-02-16 DIAGNOSIS — W19XXXD Unspecified fall, subsequent encounter: Secondary | ICD-10-CM | POA: Diagnosis not present

## 2020-02-16 DIAGNOSIS — M25572 Pain in left ankle and joints of left foot: Secondary | ICD-10-CM | POA: Diagnosis not present

## 2020-02-16 DIAGNOSIS — M7989 Other specified soft tissue disorders: Secondary | ICD-10-CM | POA: Diagnosis not present

## 2020-02-16 DIAGNOSIS — M7732 Calcaneal spur, left foot: Secondary | ICD-10-CM | POA: Diagnosis not present

## 2020-02-16 DIAGNOSIS — M79605 Pain in left leg: Secondary | ICD-10-CM | POA: Diagnosis not present

## 2020-02-16 DIAGNOSIS — E1159 Type 2 diabetes mellitus with other circulatory complications: Secondary | ICD-10-CM | POA: Diagnosis not present

## 2020-04-11 DIAGNOSIS — I152 Hypertension secondary to endocrine disorders: Secondary | ICD-10-CM | POA: Diagnosis not present

## 2020-04-11 DIAGNOSIS — E1165 Type 2 diabetes mellitus with hyperglycemia: Secondary | ICD-10-CM | POA: Diagnosis not present

## 2020-04-11 DIAGNOSIS — E1169 Type 2 diabetes mellitus with other specified complication: Secondary | ICD-10-CM | POA: Diagnosis not present

## 2020-04-11 DIAGNOSIS — Z23 Encounter for immunization: Secondary | ICD-10-CM | POA: Diagnosis not present

## 2020-04-11 DIAGNOSIS — E1159 Type 2 diabetes mellitus with other circulatory complications: Secondary | ICD-10-CM | POA: Diagnosis not present

## 2020-04-25 ENCOUNTER — Other Ambulatory Visit: Payer: Self-pay | Admitting: Physician Assistant

## 2020-04-25 DIAGNOSIS — Z1231 Encounter for screening mammogram for malignant neoplasm of breast: Secondary | ICD-10-CM

## 2020-05-23 ENCOUNTER — Other Ambulatory Visit: Payer: Self-pay

## 2020-05-23 ENCOUNTER — Ambulatory Visit
Admission: RE | Admit: 2020-05-23 | Discharge: 2020-05-23 | Disposition: A | Discharge status: Home / Self Care | Payer: BC Managed Care – PPO | Source: Ambulatory Visit | Attending: Physician Assistant | Admitting: Physician Assistant

## 2020-05-23 DIAGNOSIS — Z1231 Encounter for screening mammogram for malignant neoplasm of breast: Secondary | ICD-10-CM

## 2020-06-17 ENCOUNTER — Other Ambulatory Visit: Payer: Self-pay

## 2020-06-17 ENCOUNTER — Ambulatory Visit
Admission: RE | Admit: 2020-06-17 | Discharge: 2020-06-17 | Disposition: A | Discharge status: Home / Self Care | Payer: BC Managed Care – PPO | Source: Ambulatory Visit | Attending: Urgent Care | Admitting: Urgent Care

## 2020-06-17 ENCOUNTER — Ambulatory Visit (INDEPENDENT_AMBULATORY_CARE_PROVIDER_SITE_OTHER): Payer: BC Managed Care – PPO

## 2020-06-17 VITALS — BP 155/76 | HR 62 | Temp 98.4°F | Resp 18

## 2020-06-17 DIAGNOSIS — S46911A Strain of unspecified muscle, fascia and tendon at shoulder and upper arm level, right arm, initial encounter: Secondary | ICD-10-CM | POA: Diagnosis not present

## 2020-06-17 DIAGNOSIS — M25511 Pain in right shoulder: Secondary | ICD-10-CM | POA: Diagnosis not present

## 2020-06-17 DIAGNOSIS — E119 Type 2 diabetes mellitus without complications: Secondary | ICD-10-CM

## 2020-06-17 MED ORDER — TIZANIDINE HCL 4 MG PO TABS: 4.0000 mg | ORAL_TABLET | Freq: Four times a day (QID) | ORAL | 0 refills | Status: AC | PRN

## 2020-06-17 MED ORDER — NAPROXEN 375 MG PO TABS: 375.0000 mg | ORAL_TABLET | Freq: Two times a day (BID) | ORAL | 0 refills | Status: AC

## 2020-06-17 NOTE — ED Triage Notes (Signed)
Patient states she was using a bucket to empty water from her bathtub and heard a pop on Thursday. Pt states she has limited ROM due to pain. Pt is aox4 and ambulatory.

## 2020-06-17 NOTE — ED Provider Notes (Signed)
[facility]   MRN: [id number] DOB: 1960/12/03  Subjective:   [name] is a 59 y.o. female presenting for 2-day history of acute onset right shoulder pain.  Patient states that she was using a bucket empty water from her bathtub and felt to a loud pop.  She has since had significant pain, difficulty moving her shoulder from the pain. She's been using a make shift shoulder sling to immobilize her shoulder.  Last a1c was 7.0% on 04/11/2020.  No current facility-administered medications for this encounter.  Current Outpatient Medications:  .  albuterol (VENTOLIN HFA) 108 (90 Base) MCG/ACT inhaler, Inhale into the lungs., Disp: , Rfl:  .  aspirin EC 81 MG tablet, Take 1 tablet (81 mg total) by mouth daily., Disp: 365 tablet, Rfl: 0 .  diclofenac Sodium (VOLTAREN) 1 % GEL, Apply topically., Disp: , Rfl:  .  Dulaglutide (TRULICITY) 1.5 MP/5.3IR SOPN, INJECT 0.5 ML (1.5MG ) SUBCUTANEOUSLY ONCE A WEEK, Disp: , Rfl:  .  empagliflozin (JARDIANCE) 10 MG TABS tablet, Take 1 tablet by mouth once daily, Disp: , Rfl:  .  glucose blood test strip, Test blood sugar daily., Disp: , Rfl:  .  insulin glargine (LANTUS) 100 UNIT/ML Solostar Pen, Inject into the skin., Disp: , Rfl:  .  Insulin Pen Needle 31G X 6 MM MISC, USE 1 PEN NEEDLE AS NEEDED, Disp: , Rfl:  .  meloxicam (MOBIC) 7.5 MG tablet, Take 1 tablet (7.5 mg total) by mouth daily., Disp: 15 tablet, Rfl: 0 .  metFORMIN (GLUCOPHAGE) 1000 MG tablet, Take by mouth., Disp: , Rfl:  .  metoprolol (TOPROL-XL) 200 MG 24 hr tablet, Take 1 tablet by mouth once daily, Disp: , Rfl:  .  mometasone (NASONEX) 50 MCG/ACT nasal spray, Place into the nose., Disp: , Rfl:  .  naproxen sodium (ALEVE) 220 MG tablet, Take by mouth., Disp: , Rfl:  .  Olopatadine HCl 0.2 % SOLN, Place one drop into both eyes daily., Disp: , Rfl:  .  pantoprazole (PROTONIX) 40 MG tablet, Take by mouth., Disp: , Rfl:  .  valsartan-hydrochlorothiazide (DIOVAN-HCT) 80-12.5 MG  tablet, Take by mouth., Disp: , Rfl:  .  ezetimibe (ZETIA) 10 MG tablet, Take 1 tablet (10 mg total) by mouth daily., Disp: 90 tablet, Rfl: 3   Allergies  Allergen Reactions  . Crestor [Rosuvastatin Calcium] Other (See Comments)    myalgias  . Norvasc [Amlodipine] Nausea Only  . Pravastatin     myalgia    Past Medical History:  Diagnosis Date  . Abdominal pain    pain around incision area   . Allergy   . Anemia   . Cancer ([place])   . Costochondritis   . Diabetes mellitus   . GERD (gastroesophageal reflux disease)   . Hyperlipidemia   . Hypertension   . Numbness of fingers      Past Surgical History:  Procedure Laterality Date  . ABDOMINAL HYSTERECTOMY     partial  . BREAST BIOPSY     left  . BREAST EXCISIONAL BIOPSY Left   . CESAREAN SECTION    . CHOLECYSTECTOMY    . CYSTECTOMY    . PANCREATECTOMY  01/09/2011  . SPLENECTOMY, PARTIAL  01/09/2011    Family History  Problem Relation Age of Onset  . Heart disease Father        died at age 12  . Stroke Mother 83       per pt stroke from high doses of iron injections  .  Cancer Mother 38       cervical  . Diabetes Mother     Social History   Tobacco Use  . Smoking status: Former Smoker    Quit date: 12/19/1981    Years since quitting: 38.5  . Smokeless tobacco: Never Used  Vaping Use  . Vaping Use: Never used  Substance Use Topics  . Alcohol use: Yes    Alcohol/week: 1.0 standard drink    Types: 1 Standard drinks or equivalent per week  . Drug use: No    ROS   Objective:   Vitals: BP (!) 155/76 (BP Location: Left Arm)   Pulse 62   Temp 98.4 F (36.9 C) (Oral)   Resp 18   SpO2 96%   Physical Exam Constitutional:      General: She is not in acute distress.    Appearance: Normal appearance. She is well-developed. She is not ill-appearing, toxic-appearing or diaphoretic.  HENT:     Head: Normocephalic and atraumatic.     Nose: Nose normal.     Mouth/Throat:     Mouth: Mucous membranes are moist.      Pharynx: Oropharynx is clear.  Eyes:     General: No scleral icterus.    Extraocular Movements: Extraocular movements intact.     Pupils: Pupils are equal, round, and reactive to light.  Cardiovascular:     Rate and Rhythm: Normal rate.  Pulmonary:     Effort: Pulmonary effort is normal.  Musculoskeletal:     Left shoulder: Tenderness (anterior deltoid and shoulder) present. No swelling, deformity, effusion, laceration, bony tenderness or crepitus. Decreased range of motion. Decreased strength (secondary to her pain).  Skin:    General: Skin is warm and dry.  Neurological:     General: No focal deficit present.     Mental Status: She is alert and oriented to person, place, and time.  Psychiatric:        Mood and Affect: Mood normal.        Behavior: Behavior normal.        Thought Content: Thought content normal.        Judgment: Judgment normal.     DG Shoulder Right  Result Date: 06/17/2020 CLINICAL DATA:  Pain. EXAM: RIGHT SHOULDER - 2+ VIEW COMPARISON:  None. FINDINGS: There is no evidence of fracture or dislocation. There is no evidence of arthropathy or other focal bone abnormality. Soft tissues are unremarkable. IMPRESSION: Negative. Electronically Signed   By: Dorise Bullion III M.D   On: 06/17/2020 13:46     Assessment and Plan :   PDMP not reviewed this encounter.  1. Right shoulder strain, initial encounter   2. Acute pain of right shoulder   3. Well controlled diabetes mellitus ([place])     Recomended naproxen, tizanidine. Counseled against shoulder sling and discussed shoulder rehab. Follow up with ortho to dictate any further imaging such as MRI, U/S. Counseled patient on potential for adverse effects with medications prescribed/recommended today, ER and return-to-clinic precautions discussed, patient verbalized understanding.    Jaynee Eagles, PA-C 06/17/20 1402

## 2020-06-27 IMAGING — DX DG FOOT COMPLETE 3+V*L*
3 series · 3 of 3 positions shown · non-contrast
Comparison: 02/05/2008

CLINICAL DATA: Inversion injury this morning with ankle and foot
pain, initial encounter

EXAM:
LEFT FOOT - COMPLETE 3+ VIEW

[foot supine dp]
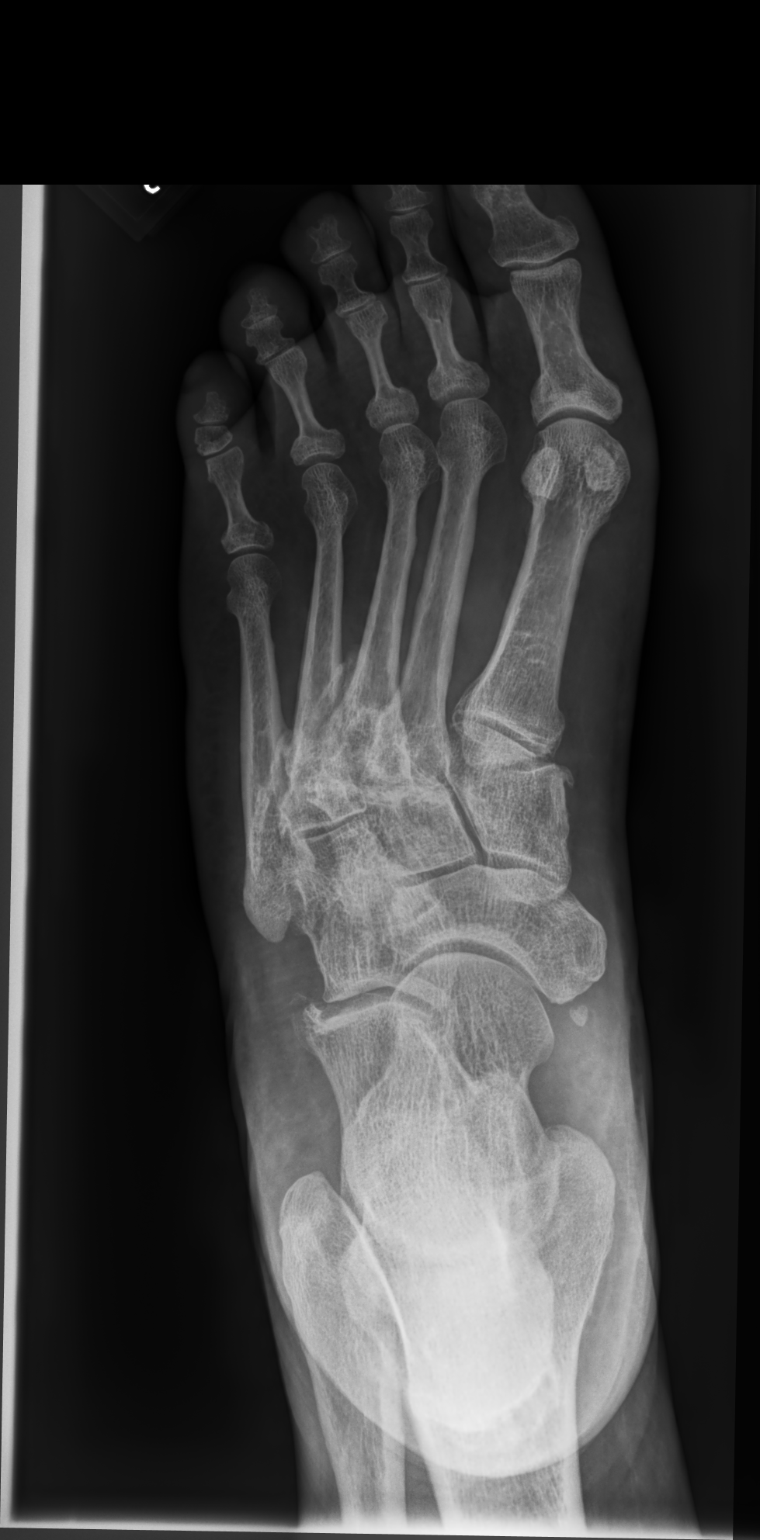

[foot medial oblique]
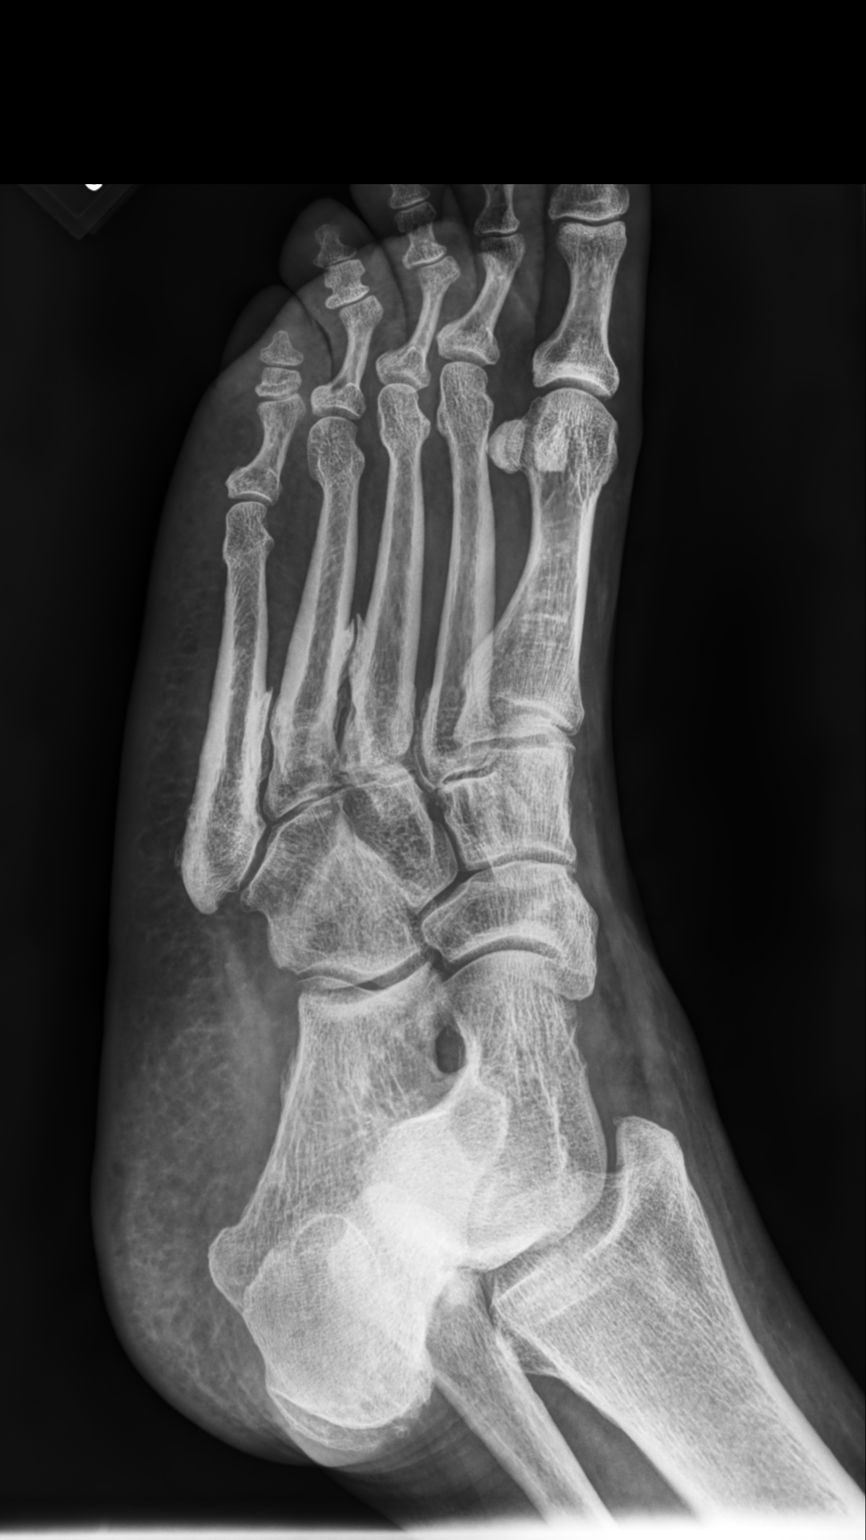

[foot supine lat]
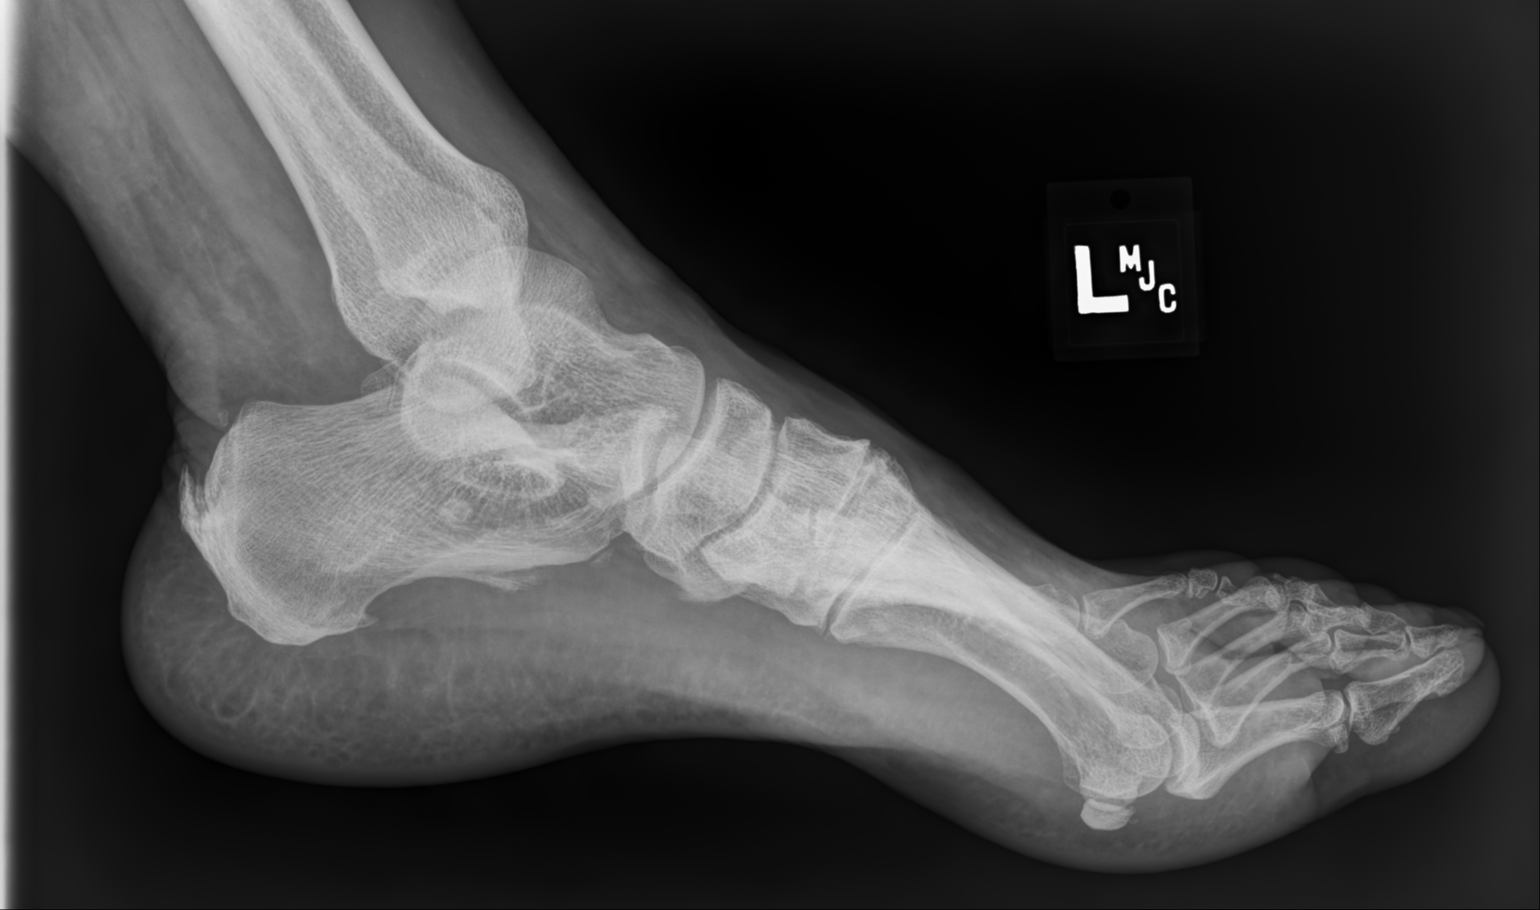

[3 of 3 positions shown; findings below may reference images not displayed]

FINDINGS: No acute fracture or dislocation is noted. Changes of prior trauma
in the proximal aspect of the third and fourth metatarsals and
possibly fifth metatarsal with healing is seen. This is chronic and
stable from the previous exam. Mild tarsal degenerative changes and
calcaneal spurring is noted. No acute abnormality is noted.
IMPRESSION: Chronic change without acute abnormality.

## 2021-02-26 ENCOUNTER — Ambulatory Visit (INDEPENDENT_AMBULATORY_CARE_PROVIDER_SITE_OTHER): Payer: BC Managed Care – PPO

## 2021-02-26 ENCOUNTER — Encounter: Payer: Self-pay | Admitting: Emergency Medicine

## 2021-02-26 ENCOUNTER — Ambulatory Visit
Admission: EM | Admit: 2021-02-26 | Discharge: 2021-02-26 | Disposition: A | Discharge status: Home / Self Care | Payer: BC Managed Care – PPO | Attending: Urgent Care | Admitting: Urgent Care

## 2021-02-26 ENCOUNTER — Other Ambulatory Visit: Payer: Self-pay

## 2021-02-26 DIAGNOSIS — S92512A Displaced fracture of proximal phalanx of left lesser toe(s), initial encounter for closed fracture: Secondary | ICD-10-CM | POA: Diagnosis not present

## 2021-02-26 DIAGNOSIS — M79675 Pain in left toe(s): Secondary | ICD-10-CM

## 2021-02-26 MED ORDER — MELOXICAM 7.5 MG PO TABS: 7.5000 mg | ORAL_TABLET | Freq: Every day | ORAL | 0 refills | Status: AC

## 2021-02-26 NOTE — Discharge Instructions (Addendum)
Your x-ray showed a fracture ( break in bone) of the 4th toe   Follow up with podiatrist in 1-2 weeks for evaluation, leave buddy tape in place until seen by specialist, do not apply weight onto toe, push   Take meloxicam once every morning for comfort,  take tylenol as needed throughout the day for additional comfort  Ice area in 15 minute intervals for the first 24 hours then may add heat as needed in 15 minute intervals

## 2021-02-26 NOTE — ED Provider Notes (Signed)
[facility]    CSN: [id number] Arrival date & time: 02/26/21  0806      History   Chief Complaint Chief Complaint  Patient presents with   Foot Injury    HPI [name] is a 60 y.o. female  Patient presents with 4th left toe pain and numbness beginning this morning after hitting toe on side of bed frame. Painful to flex and extend toe. Denies prior injury or trauma, swelling.   Past Medical History:  Diagnosis Date   Abdominal pain    pain around incision area    Allergy    Anemia    Cancer ([place])    Costochondritis    Diabetes mellitus    GERD (gastroesophageal reflux disease)    Hyperlipidemia    Hypertension    Numbness of fingers     Patient Active Problem List   Diagnosis Date Noted   Cough 07/03/2018   Chest congestion 07/03/2018   Viral respiratory infection 07/03/2018   Status post splenectomy 12/01/2017   Retinopathy 10/10/2015   Pure hypercholesterolemia 12/21/2014   HTN (hypertension) 06/13/2012   Diabetes mellitus type II, uncontrolled ([place]) 11/14/2011   Diabetic peripheral neuropathy associated with type 2 diabetes mellitus ([place]) 11/14/2011   Obesity, Class III, BMI 40-49.9 (morbid obesity) ([place]) 11/14/2011   Menopause 11/14/2011   Mass on back 10/28/2011   Pleural effusion 03/20/2011   Status post cholecystectomy 01/24/2011   Serous cystadenoma of pancreas 01/24/2011    Past Surgical History:  Procedure Laterality Date   ABDOMINAL HYSTERECTOMY     partial   BREAST BIOPSY     left   BREAST EXCISIONAL BIOPSY Left    CESAREAN SECTION     CHOLECYSTECTOMY     CYSTECTOMY     PANCREATECTOMY  01/09/2011   SPLENECTOMY, PARTIAL  01/09/2011    OB History   No obstetric history on file.      Home Medications    Prior to Admission medications   Medication Sig Start Date End Date Taking? Authorizing Provider  albuterol (VENTOLIN HFA) 108 (90 Base) MCG/ACT inhaler Inhale into the lungs. 10/05/19   [provider]   aspirin EC 81 MG tablet Take 1 tablet (81 mg total) by mouth daily. 05/05/17   Tereasa Coop, PA-C  diclofenac Sodium (VOLTAREN) 1 % GEL Apply topically. 08/20/19   [provider]  Dulaglutide (TRULICITY) 1.5 0000000 SOPN INJECT 0.5 ML (1.'5MG'$ ) SUBCUTANEOUSLY ONCE A WEEK 10/05/19   [provider]  empagliflozin (JARDIANCE) 10 MG TABS tablet Take 1 tablet by mouth once daily 10/05/19   [provider]  ezetimibe (ZETIA) 10 MG tablet Take 1 tablet (10 mg total) by mouth daily. 10/14/19 01/12/20  Donato Heinz, MD  glucose blood test strip Test blood sugar daily. 12/01/17   [provider]  insulin glargine (LANTUS) 100 UNIT/ML Solostar Pen Inject into the skin. 10/05/19   [provider]  Insulin Pen Needle 31G X 6 MM MISC USE 1 PEN NEEDLE AS NEEDED 12/01/17   [provider]  meloxicam (MOBIC) 7.5 MG tablet Take 1 tablet (7.5 mg total) by mouth daily. 12/21/19   Ok Edwards, PA-C  metFORMIN (GLUCOPHAGE) 1000 MG tablet Take by mouth. 10/05/19   [provider]  metoprolol (TOPROL-XL) 200 MG 24 hr tablet Take 1 tablet by mouth once daily 10/05/19   [provider]  mometasone (NASONEX) 50 MCG/ACT nasal spray Place into the nose. 10/05/19 10/04/20  [provider]  naproxen (  NAPROSYN) 375 MG tablet Take 1 tablet (375 mg total) by mouth 2 (two) times daily with a meal. 06/17/20   Jaynee Eagles, PA-C  naproxen sodium (ALEVE) 220 MG tablet Take by mouth.    [provider]  Olopatadine HCl 0.2 % SOLN Place one drop into both eyes daily. 05/24/19   [provider]  pantoprazole (PROTONIX) 40 MG tablet Take by mouth. 04/13/19   [provider]  tiZANidine (ZANAFLEX) 4 MG tablet Take 1 tablet (4 mg total) by mouth every 6 (six) hours as needed for muscle spasms. 06/17/20   Jaynee Eagles, PA-C  valsartan-hydrochlorothiazide (DIOVAN-HCT) 80-12.5 MG tablet Take by mouth. 10/05/19   [provider]     Family History Family History  Problem Relation Age of Onset   Heart disease Father        died at age 41   Stroke Mother 45       per pt stroke from high doses of iron injections   Cancer Mother 8       cervical   Diabetes Mother     Social History Social History   Tobacco Use   Smoking status: Former    Types: Cigarettes    Quit date: 12/19/1981    Years since quitting: 39.2   Smokeless tobacco: Never  Vaping Use   Vaping Use: Never used  Substance Use Topics   Alcohol use: Yes    Alcohol/week: 1.0 standard drink    Types: 1 Standard drinks or equivalent per week   Drug use: No     Allergies   Crestor [rosuvastatin calcium], Norvasc [amlodipine], and Pravastatin   Review of Systems Review of Systems  Constitutional: Negative.   Respiratory: Negative.    Cardiovascular: Negative.   Musculoskeletal:  Positive for arthralgias. Negative for back pain, gait problem, joint swelling, myalgias, neck pain and neck stiffness.  Skin: Negative.   Neurological:  Positive for numbness. Negative for dizziness, tremors, seizures, syncope, facial asymmetry, speech difficulty, weakness, light-headedness and headaches.    Physical Exam Triage Vital Signs ED Triage Vitals [02/26/21 0834]  Enc Vitals Group     BP (!) 160/87     Pulse Rate 66     Resp 20     Temp 98 F (36.7 C)     Temp Source Oral     SpO2 98 %     Weight      Height      Head Circumference      Peak Flow      Pain Score 0     Pain Loc      Pain Edu?      Excl. in [place]?    No data found.  Updated Vital Signs BP (!) 160/87 (BP Location: Left Arm)   Pulse 66   Temp 98 F (36.7 C) (Oral)   Resp 20   SpO2 98%   Visual Acuity Right Eye Distance:   Left Eye Distance:   Bilateral Distance:    Right Eye Near:   Left Eye Near:    Bilateral Near:     Physical Exam Constitutional:      Appearance: Normal appearance.  HENT:     Head: Normocephalic.  Eyes:     Extraocular Movements:  Extraocular movements intact.  Pulmonary:     Effort: Pulmonary effort is normal.  Musculoskeletal:     Comments: Point tenderness at the 4th proximal phalanx with decreased sensation, no swelling, ecchymosis, deformity noted, minimal effort for active  ROM due to pain, passive ROM intact.   Neurological:     Mental Status: She is alert and oriented to person, place, and time. Mental status is at baseline.  Psychiatric:        Behavior: Behavior normal.     UC Treatments / Results  Labs (all labs ordered are listed, but only abnormal results are displayed) Labs Reviewed - No data to display  EKG   Radiology No results found.  Procedures Procedures (including critical care time)  Medications Ordered in UC Medications - No data to display  Initial Impression / Assessment and Plan / UC Course  I have reviewed the triage vital signs and the nursing notes.  Pertinent labs & imaging results that were available during my care of the patient were reviewed by me and considered in my medical decision making (see chart for details).  Closed displaced fracture of 4th proximal phalanx left foot  Fracture confirmed by x-ray Meloxicam 7.5 mg daily prn Buddy tape 3rd and 4th toe, to be left in place until follow up appointment, neurovascularly intact prior to and after placement, NWB Follow up with podiatry in 1-2 weeks, resource given  Heat or ice for additional comfort   Final Clinical Impressions(s) / UC Diagnoses   Final diagnoses:  None   Discharge Instructions   None    ED Prescriptions   None    PDMP not reviewed this encounter.   Hans Eden, [place] 02/26/21 [phone]

## 2021-02-26 NOTE — ED Triage Notes (Signed)
Stubbed fourth toe on left foot this morning. Denies foot or other toe pain. Wants to know if it's broken

## 2021-03-01 ENCOUNTER — Ambulatory Visit
Admission: EM | Admit: 2021-03-01 | Discharge: 2021-03-01 | Disposition: A | Discharge status: Home / Self Care | Payer: BC Managed Care – PPO | Attending: Internal Medicine | Admitting: Internal Medicine

## 2021-03-01 ENCOUNTER — Encounter: Payer: Self-pay | Admitting: Emergency Medicine

## 2021-03-01 ENCOUNTER — Other Ambulatory Visit: Payer: Self-pay

## 2021-03-01 DIAGNOSIS — Z20822 Contact with and (suspected) exposure to covid-19: Secondary | ICD-10-CM | POA: Insufficient documentation

## 2021-03-01 DIAGNOSIS — J069 Acute upper respiratory infection, unspecified: Secondary | ICD-10-CM | POA: Diagnosis not present

## 2021-03-01 DIAGNOSIS — J029 Acute pharyngitis, unspecified: Secondary | ICD-10-CM | POA: Diagnosis not present

## 2021-03-01 LAB — POCT RAPID STREP A (OFFICE): Rapid Strep A Screen: NEGATIVE

## 2021-03-01 MED ORDER — AMOXICILLIN 875 MG PO TABS: 875.0000 mg | ORAL_TABLET | Freq: Two times a day (BID) | ORAL | 0 refills | Status: AC

## 2021-03-01 NOTE — ED Triage Notes (Signed)
Patient c/o sore throat x 2 days, fever x 1 day, right ear pain.  Patient has taken Tylenol.  Home COVID test negative.  Patient is vaccinated for COVID.

## 2021-03-01 NOTE — Discharge Instructions (Addendum)
Your rapid strep test was negative today. Throat culture and covid 19 test are pending. We will call if these are positive. You are being treated with amoxicillin antibiotic for suspicion of strep throat.

## 2021-03-01 NOTE — ED Provider Notes (Signed)
[facility]    CSN: [id number] Arrival date & time: 03/01/21  0800      History   Chief Complaint Chief Complaint  Patient presents with   Sore Throat    HPI [name] is a 60 y.o. female.   Patient presents with 2-day history sore throat, fever, right ear pain, mild nasal congestion.  Temp max was 100.2. Denies any known sick contacts. Took an at home covid 19 test yesterday that was negative. Patient has taken OTC tylenol with relief of fever. Denies any chest pain or shortness of breath.    Sore Throat   Past Medical History:  Diagnosis Date   Abdominal pain    pain around incision area    Allergy    Anemia    Cancer ([place])    Costochondritis    Diabetes mellitus    GERD (gastroesophageal reflux disease)    Hyperlipidemia    Hypertension    Numbness of fingers     Patient Active Problem List   Diagnosis Date Noted   Cough 07/03/2018   Chest congestion 07/03/2018   Viral respiratory infection 07/03/2018   Status post splenectomy 12/01/2017   Retinopathy 10/10/2015   Pure hypercholesterolemia 12/21/2014   HTN (hypertension) 06/13/2012   Diabetes mellitus type II, uncontrolled ([place]) 11/14/2011   Diabetic peripheral neuropathy associated with type 2 diabetes mellitus ([place]) 11/14/2011   Obesity, Class III, BMI 40-49.9 (morbid obesity) ([place]) 11/14/2011   Menopause 11/14/2011   Mass on back 10/28/2011   Pleural effusion 03/20/2011   Status post cholecystectomy 01/24/2011   Serous cystadenoma of pancreas 01/24/2011    Past Surgical History:  Procedure Laterality Date   ABDOMINAL HYSTERECTOMY     partial   BREAST BIOPSY     left   BREAST EXCISIONAL BIOPSY Left    CESAREAN SECTION     CHOLECYSTECTOMY     CYSTECTOMY     PANCREATECTOMY  01/09/2011   SPLENECTOMY, PARTIAL  01/09/2011    OB History   No obstetric history on file.      Home Medications    Prior to Admission medications   Medication Sig Start Date End Date Taking?  Authorizing Provider  albuterol (VENTOLIN HFA) 108 (90 Base) MCG/ACT inhaler Inhale into the lungs. 10/05/19  Yes [provider]  amoxicillin (AMOXIL) 875 MG tablet Take 1 tablet (875 mg total) by mouth 2 (two) times daily for 10 days. 03/01/21 03/11/21 Yes Odis Luster, FNP  aspirin EC 81 MG tablet Take 1 tablet (81 mg total) by mouth daily. 05/05/17  Yes Tereasa Coop, PA-C  diclofenac Sodium (VOLTAREN) 1 % GEL Apply topically. 08/20/19  Yes [provider]  Dulaglutide (TRULICITY) 1.5 0000000 SOPN INJECT 0.5 ML (1.'5MG'$ ) SUBCUTANEOUSLY ONCE A WEEK 10/05/19  Yes [provider]  empagliflozin (JARDIANCE) 10 MG TABS tablet Take 1 tablet by mouth once daily 10/05/19  Yes [provider]  glucose blood test strip Test blood sugar daily. 12/01/17  Yes [provider]  insulin glargine (LANTUS) 100 UNIT/ML Solostar Pen Inject into the skin. 10/05/19  Yes [provider]  Insulin Pen Needle 31G X 6 MM MISC USE 1 PEN NEEDLE AS NEEDED 12/01/17  Yes [provider]  meloxicam (MOBIC) 7.5 MG tablet Take 1 tablet (7.5 mg total) by mouth daily. 02/26/21  Yes Hans Eden, NP  metFORMIN (GLUCOPHAGE) 1000 MG tablet Take by mouth. 10/05/19  Yes [provider]  metoprolol (TOPROL-XL) 200 MG 24 hr tablet  Take 1 tablet by mouth once daily 10/05/19  Yes [provider]  naproxen (NAPROSYN) 375 MG tablet Take 1 tablet (375 mg total) by mouth 2 (two) times daily with a meal. 06/17/20  Yes Jaynee Eagles, PA-C  naproxen sodium (ALEVE) 220 MG tablet Take by mouth.   Yes [provider]  Olopatadine HCl 0.2 % SOLN Place one drop into both eyes daily. 05/24/19  Yes [provider]  pantoprazole (PROTONIX) 40 MG tablet Take by mouth. 04/13/19  Yes [provider]  tiZANidine (ZANAFLEX) 4 MG tablet Take 1 tablet (4 mg total) by mouth every 6 (six) hours as needed for muscle spasms. 06/17/20  Yes Jaynee Eagles, PA-C   valsartan-hydrochlorothiazide (DIOVAN-HCT) 80-12.5 MG tablet Take by mouth. 10/05/19  Yes [provider]  ezetimibe (ZETIA) 10 MG tablet Take 1 tablet (10 mg total) by mouth daily. 10/14/19 01/12/20  Donato Heinz, MD  mometasone (NASONEX) 50 MCG/ACT nasal spray Place into the nose. 10/05/19 10/04/20  [provider]    Family History Family History  Problem Relation Age of Onset   Heart disease Father        died at age 13   Stroke Mother 64       per pt stroke from high doses of iron injections   Cancer Mother 91       cervical   Diabetes Mother     Social History Social History   Tobacco Use   Smoking status: Former    Types: Cigarettes    Quit date: 12/19/1981    Years since quitting: 39.2   Smokeless tobacco: Never  Vaping Use   Vaping Use: Never used  Substance Use Topics   Alcohol use: Yes    Alcohol/week: 1.0 standard drink    Types: 1 Standard drinks or equivalent per week   Drug use: No     Allergies   Crestor [rosuvastatin calcium], Norvasc [amlodipine], and Pravastatin   Review of Systems Review of Systems Per HPI  Physical Exam Triage Vital Signs ED Triage Vitals  Enc Vitals Group     BP 03/01/21 0816 (!) 159/86     Pulse Rate 03/01/21 0816 64     Resp 03/01/21 0816 18     Temp 03/01/21 0816 97.7 F (36.5 C)     Temp Source 03/01/21 0816 Oral     SpO2 03/01/21 0816 95 %     Weight 03/01/21 0817 255 lb (115.7 kg)     Height 03/01/21 0817 '5\' 6"'$  (1.676 m)     Head Circumference --      Peak Flow --      Pain Score 03/01/21 0817 6     Pain Loc --      Pain Edu? --      Excl. in [place]? --    No data found.  Updated Vital Signs BP (!) 159/86 (BP Location: Left Arm)   Pulse 64   Temp 97.7 F (36.5 C) (Oral)   Resp 18   Ht '5\' 6"'$  (1.676 m)   Wt 255 lb (115.7 kg)   SpO2 95%   BMI 41.16 kg/m   Visual Acuity Right Eye Distance:   Left Eye Distance:   Bilateral Distance:    Right Eye Near:   Left Eye Near:     Bilateral Near:     Physical Exam Constitutional:      General: She is not in acute distress.    Appearance: Normal appearance.  HENT:  Head: Normocephalic and atraumatic.     Right Ear: Ear canal normal. A middle ear effusion is present. Tympanic membrane is not erythematous.     Left Ear: Ear canal normal. A middle ear effusion is present. Tympanic membrane is not erythematous.     Nose: Congestion present.     Mouth/Throat:     Mouth: Mucous membranes are moist.     Pharynx: Oropharyngeal exudate and posterior oropharyngeal erythema present.  Eyes:     Extraocular Movements: Extraocular movements intact.     Conjunctiva/sclera: Conjunctivae normal.     Pupils: Pupils are equal, round, and reactive to light.  Cardiovascular:     Rate and Rhythm: Normal rate and regular rhythm.     Pulses: Normal pulses.     Heart sounds: Normal heart sounds.  Pulmonary:     Effort: Pulmonary effort is normal. No respiratory distress.     Breath sounds: Normal breath sounds. No wheezing.  Abdominal:     General: Abdomen is flat. Bowel sounds are normal.     Palpations: Abdomen is soft.  Musculoskeletal:        General: Normal range of motion.     Cervical back: Normal range of motion.  Skin:    General: Skin is warm and dry.  Neurological:     General: No focal deficit present.     Mental Status: She is alert and oriented to person, place, and time. Mental status is at baseline.  Psychiatric:        Mood and Affect: Mood normal.        Behavior: Behavior normal.     UC Treatments / Results  Labs (all labs ordered are listed, but only abnormal results are displayed) Labs Reviewed  CULTURE, GROUP A STREP ([place])  NOVEL CORONAVIRUS, NAA  POCT RAPID STREP A (OFFICE)    EKG   Radiology No results found.  Procedures Procedures (including critical care time)  Medications Ordered in UC Medications - No data to display  Initial Impression / Assessment and Plan / UC Course   I have reviewed the triage vital signs and the nursing notes.  Pertinent labs & imaging results that were available during my care of the patient were reviewed by me and considered in my medical decision making (see chart for details).     Rapid strep test was negative today, although suspect strep throat is still present due to appearance of posterior pharynx on exam. Will treat with amoxicillin antibiotic. Throat culture and covid 19 viral swab are pending. Fever monitoring and management discussed with patient. Discussed OTC medications with patient to alleviate symptoms. Discussed strict return precautions. Patient verbalized understanding and is agreeable with plan.  Final Clinical Impressions(s) / UC Diagnoses   Final diagnoses:  Sore throat  Encounter for laboratory testing for COVID-19 virus  Acute upper respiratory infection     Discharge Instructions      Your rapid strep test was negative today. Throat culture and covid 19 test are pending. We will call if these are positive. You are being treated with amoxicillin antibiotic for suspicion of strep throat.      ED Prescriptions     Medication Sig Dispense Auth. Provider   amoxicillin (AMOXIL) 875 MG tablet Take 1 tablet (875 mg total) by mouth 2 (two) times daily for 10 days. 20 tablet Odis Luster, FNP      PDMP not reviewed this encounter.   Odis Luster, [place] 03/01/21 [phone]

## 2021-03-02 LAB — NOVEL CORONAVIRUS, NAA: SARS-CoV-2, NAA: NOT DETECTED

## 2021-03-02 LAB — SARS-COV-2, NAA 2 DAY TAT

## 2021-03-03 LAB — CULTURE, GROUP A STREP (THRC)

## 2021-06-14 ENCOUNTER — Other Ambulatory Visit: Payer: Self-pay | Admitting: Physician Assistant

## 2021-06-14 DIAGNOSIS — Z1231 Encounter for screening mammogram for malignant neoplasm of breast: Secondary | ICD-10-CM

## 2021-07-12 ENCOUNTER — Ambulatory Visit
Admission: RE | Admit: 2021-07-12 | Discharge: 2021-07-12 | Disposition: A | Discharge status: Home / Self Care | Payer: BC Managed Care – PPO | Source: Ambulatory Visit | Attending: Physician Assistant | Admitting: Physician Assistant

## 2021-07-12 DIAGNOSIS — Z1231 Encounter for screening mammogram for malignant neoplasm of breast: Secondary | ICD-10-CM

## 2022-09-11 ENCOUNTER — Other Ambulatory Visit: Payer: Self-pay | Admitting: Physician Assistant

## 2022-09-11 DIAGNOSIS — Z1231 Encounter for screening mammogram for malignant neoplasm of breast: Secondary | ICD-10-CM

## 2022-09-16 ENCOUNTER — Ambulatory Visit
Admission: RE | Admit: 2022-09-16 | Discharge: 2022-09-16 | Disposition: A | Discharge status: Home / Self Care | Payer: BC Managed Care – PPO | Source: Ambulatory Visit

## 2022-09-16 DIAGNOSIS — Z1231 Encounter for screening mammogram for malignant neoplasm of breast: Secondary | ICD-10-CM

## 2023-09-12 ENCOUNTER — Other Ambulatory Visit: Payer: Self-pay | Admitting: Physician Assistant

## 2023-09-12 DIAGNOSIS — Z1231 Encounter for screening mammogram for malignant neoplasm of breast: Secondary | ICD-10-CM

## 2023-09-29 ENCOUNTER — Ambulatory Visit
Admission: RE | Admit: 2023-09-29 | Discharge: 2023-09-29 | Disposition: A | Discharge status: Home / Self Care | Payer: BC Managed Care – PPO | Source: Ambulatory Visit | Attending: Physician Assistant | Admitting: Physician Assistant

## 2023-09-29 DIAGNOSIS — Z1231 Encounter for screening mammogram for malignant neoplasm of breast: Secondary | ICD-10-CM
